# Patient Record
Sex: Male | Born: 1965 | Race: White | Hispanic: No | Marital: Married | Smoking: Former smoker
Health system: Southern US, Community
[De-identification: ages and names within clinical notes are randomized; demographics above are authoritative.]

## PROBLEM LIST (undated history)

## (undated) ENCOUNTER — Ambulatory Visit (HOSPITAL_BASED_OUTPATIENT_CLINIC_OR_DEPARTMENT_OTHER): Admission: EM | Source: Home / Self Care

## (undated) DIAGNOSIS — I10 Essential (primary) hypertension: Secondary | ICD-10-CM

## (undated) DIAGNOSIS — K219 Gastro-esophageal reflux disease without esophagitis: Secondary | ICD-10-CM

## (undated) DIAGNOSIS — E7800 Pure hypercholesterolemia, unspecified: Secondary | ICD-10-CM

## (undated) DIAGNOSIS — T7840XA Allergy, unspecified, initial encounter: Secondary | ICD-10-CM

## (undated) DIAGNOSIS — E119 Type 2 diabetes mellitus without complications: Secondary | ICD-10-CM

## (undated) DIAGNOSIS — M1990 Unspecified osteoarthritis, unspecified site: Secondary | ICD-10-CM

## (undated) HISTORY — DX: Unspecified osteoarthritis, unspecified site: M19.90

## (undated) HISTORY — PX: BACK SURGERY: SHX140

## (undated) HISTORY — DX: Allergy, unspecified, initial encounter: T78.40XA

## (undated) HISTORY — DX: Essential (primary) hypertension: I10

## (undated) HISTORY — DX: Gastro-esophageal reflux disease without esophagitis: K21.9

## (undated) HISTORY — DX: Type 2 diabetes mellitus without complications: E11.9

## (undated) HISTORY — DX: Pure hypercholesterolemia, unspecified: E78.00

---

## 2000-09-10 ENCOUNTER — Encounter: Payer: Self-pay | Admitting: Orthopedic Surgery

## 2000-09-10 ENCOUNTER — Ambulatory Visit (HOSPITAL_COMMUNITY): Admission: RE | Admit: 2000-09-10 | Discharge: 2000-09-10 | Payer: Self-pay | Admitting: Orthopedic Surgery

## 2000-11-03 ENCOUNTER — Encounter: Admission: RE | Admit: 2000-11-03 | Discharge: 2000-11-03 | Payer: Self-pay | Admitting: Orthopedic Surgery

## 2000-11-03 ENCOUNTER — Encounter: Payer: Self-pay | Admitting: Orthopedic Surgery

## 2000-11-17 ENCOUNTER — Encounter: Payer: Self-pay | Admitting: Orthopedic Surgery

## 2000-11-17 ENCOUNTER — Encounter: Admission: RE | Admit: 2000-11-17 | Discharge: 2000-11-17 | Payer: Self-pay | Admitting: Orthopedic Surgery

## 2000-12-01 ENCOUNTER — Encounter: Admission: RE | Admit: 2000-12-01 | Discharge: 2000-12-01 | Payer: Self-pay | Admitting: Orthopedic Surgery

## 2000-12-01 ENCOUNTER — Encounter: Payer: Self-pay | Admitting: Orthopedic Surgery

## 2001-04-27 HISTORY — PX: BACK SURGERY: SHX140

## 2005-09-11 ENCOUNTER — Ambulatory Visit (HOSPITAL_COMMUNITY): Admission: RE | Admit: 2005-09-11 | Discharge: 2005-09-12 | Payer: Self-pay | Admitting: Neurosurgery

## 2009-04-27 HISTORY — PX: BACK SURGERY: SHX140

## 2009-11-18 ENCOUNTER — Encounter: Admission: RE | Admit: 2009-11-18 | Discharge: 2009-11-18 | Payer: Self-pay | Admitting: Orthopedic Surgery

## 2010-06-07 ENCOUNTER — Emergency Department (HOSPITAL_COMMUNITY)
Admission: EM | Admit: 2010-06-07 | Discharge: 2010-06-07 | Disposition: A | Discharge status: Home / Self Care | Payer: BC Managed Care – PPO | Attending: Emergency Medicine | Admitting: Emergency Medicine

## 2010-06-07 DIAGNOSIS — M545 Low back pain, unspecified: Secondary | ICD-10-CM | POA: Insufficient documentation

## 2010-06-07 DIAGNOSIS — I10 Essential (primary) hypertension: Secondary | ICD-10-CM | POA: Insufficient documentation

## 2010-06-07 DIAGNOSIS — G8929 Other chronic pain: Secondary | ICD-10-CM | POA: Insufficient documentation

## 2010-06-07 DIAGNOSIS — E7800 Pure hypercholesterolemia, unspecified: Secondary | ICD-10-CM | POA: Insufficient documentation

## 2010-07-02 ENCOUNTER — Encounter (HOSPITAL_COMMUNITY)
Admission: RE | Admit: 2010-07-02 | Discharge: 2010-07-02 | Disposition: A | Discharge status: Home / Self Care | Payer: BC Managed Care – PPO | Source: Ambulatory Visit | Attending: Neurosurgery | Admitting: Neurosurgery

## 2010-07-02 LAB — PROTIME-INR
INR: 1 (ref 0.00–1.49)
Prothrombin Time: 13.4 seconds (ref 11.6–15.2)

## 2010-07-02 LAB — DIFFERENTIAL
Basophils Absolute: 0.1 10*3/uL (ref 0.0–0.1)
Basophils Relative: 1 % (ref 0–1)
Eosinophils Absolute: 0.2 10*3/uL (ref 0.0–0.7)
Monocytes Absolute: 0.4 10*3/uL (ref 0.1–1.0)
Monocytes Relative: 7 % (ref 3–12)
Neutro Abs: 3.6 10*3/uL (ref 1.7–7.7)

## 2010-07-02 LAB — BASIC METABOLIC PANEL
BUN: 10 mg/dL (ref 6–23)
CO2: 31 mEq/L (ref 19–32)
Calcium: 9.5 mg/dL (ref 8.4–10.5)
Creatinine, Ser: 0.89 mg/dL (ref 0.4–1.5)
GFR calc non Af Amer: >60 mL/min (ref >60)
Glucose, Bld: 148 mg/dL — ABNORMAL HIGH (ref 70–99)
Sodium: 137 mEq/L (ref 135–145)

## 2010-07-02 LAB — CBC
Hemoglobin: 14.3 g/dL (ref 13.0–17.0)
MCH: 28.6 pg (ref 26.0–34.0)
MCHC: 33.6 g/dL (ref 30.0–36.0)
Platelets: 229 10*3/uL (ref 150–400)
RDW: 12.5 % (ref 11.5–15.5)

## 2010-07-09 ENCOUNTER — Observation Stay (HOSPITAL_COMMUNITY)
Admission: RE | Admit: 2010-07-09 | Discharge: 2010-07-10 | Disposition: A | Discharge status: Home / Self Care | Payer: BC Managed Care – PPO | Source: Ambulatory Visit | Attending: Neurosurgery | Admitting: Neurosurgery

## 2010-07-09 ENCOUNTER — Ambulatory Visit (HOSPITAL_COMMUNITY): Payer: BC Managed Care – PPO

## 2010-07-09 DIAGNOSIS — Z01818 Encounter for other preprocedural examination: Secondary | ICD-10-CM | POA: Insufficient documentation

## 2010-07-09 DIAGNOSIS — Z01812 Encounter for preprocedural laboratory examination: Secondary | ICD-10-CM | POA: Insufficient documentation

## 2010-07-09 DIAGNOSIS — M5126 Other intervertebral disc displacement, lumbar region: Principal | ICD-10-CM | POA: Insufficient documentation

## 2010-07-09 LAB — GLUCOSE, CAPILLARY: Glucose-Capillary: 129 mg/dL — ABNORMAL HIGH (ref 70–99)

## 2010-07-13 NOTE — Op Note (Signed)
Ricardo Wolf, Ricardo Wolf               ACCOUNT NO.:  [id number]  MEDICAL RECORD NO.:  [id number]           PATIENT TYPE:  I  LOCATION:  3523                         FACILITY:  [facility]  PHYSICIAN:  Donalee Citrin, M.D.        DATE OF BIRTH:  04/09/1966  DATE OF PROCEDURE:  07/09/2010 DATE OF DISCHARGE:                              OPERATIVE REPORT   PREOPERATIVE DIAGNOSIS:  Left L4 radiculopathy from ruptured disk L3-4 left.  PROCEDURE:  Lumbar laminectomy and microdiskectomy L3-4 left for microscopic dissection of the left L4 nerve root microscopic diskectomy.  SURGEON:  Donalee Citrin, MD  ASSISTANT:  Reinaldo Meeker, MD  ANESTHESIA:  General endotracheal.  HISTORY OF PRESENT ILLNESS:  The patient is a very pleasant 45 year old gentleman who has had progressed worsening back and left leg pain running down the outside of his left thigh down to the shin consistent with an L4 nerve root pattern.  The patient failed all forms of conservative treatment and put on steroid injections, physical therapy, and time.  MRI scan showed a very large disk herniation with free fragment displacing the L4 nerve against the pedicle, and due to the patient failing conservative treatment, MRI findings, clinical exam, the patient was recommended laminectomy and diskectomy.  Went over the risks and benefits of the operation with the patient.  He understood and agreed to proceed forward.  The patient was brought to the OR, was induced under general anesthesia, positioned prone on Wilson frame.  Back was prepped and draped in usual sterile fashion.  Preoperative x-ray localized the appropriate level, so after infiltration of 10 mL lidocaine with epi a midline incision was made.  Bovie cautery was used to take down the subcutaneous tissue. Subperiosteal dissection was carried out on the lamina of L3 and L4 on the left.  Intraoperative x-ray identified the appropriate level, so the inferior aspect of L3 was  drilled down, medial facet complex, and superior aspect of L4.  Laminotomy was begun.  Ligament was noted to be markedly hypertrophied.  This was removed in piecemeal fashion.  The undersurface of the immediate gutter was underbitten to identify the L4 nerve root and the L4 pedicle.  The L4 nerve root was densely stuck against the pedicle due to free fragment that had migrated underneath the four root and was presenting in the axilla of the four root at the inferior margin of the pedicle, so after further underbiting of medial gutter to gain lateral access to disk space and lateral aspect of canal, the scar and annulus was then incised above the nerve root as well as inferior to the nerve root in the axilla to free up the densely adherent disk herniation underneath the four root.  This was accomplished with a nerve hook teasing the disk out.  Several large fragments were removed from the inferior compartment as well as superior compartment.  Disk space was then identified.  Epidural veins were coagulated.  Disk space was then cleaned out with pituitary rongeurs, and then marching inferiorly the L4 foramen was opened up and further fragments were removed both from superior and inferior  to the four root until the four root was completely decompressed easily accepting a coronary angle hockey stick out the foramen.  At the end of the diskectomy, there was no further stenosis.  The L4 nerve root was widely decompressed as well as thecal sac centrally as well as cephalocaudally.  The wound was then copiously irrigated.  Meticulous hemostasis was maintained.  Gelfoam was laid on top of the dura, and the muscle and fascia and proximal layers with interrupted Vicryl and the skin was closed with running 4-0 subcuticular.  Benzoin and Steri-Strips were applied.  The patient went to recovery room in stable condition.          ______________________________ Donalee Citrin, M.D.     GC/MEDQ  D:   07/09/2010  T:  07/10/2010  Job:  [id number]  Electronically Signed by Donalee Citrin M.D. on 07/13/2010 03:59:29 PM

## 2010-07-23 NOTE — Discharge Summary (Signed)
  NAMETYSHON, FANNING               ACCOUNT NO.:  [id number]  MEDICAL RECORD NO.:  [id number]           PATIENT TYPE:  O  LOCATION:  3523                         FACILITY:  [facility]  PHYSICIAN:  Donalee Citrin, M.D.        DATE OF BIRTH:  06/26/1965  DATE OF ADMISSION:  07/09/2010 DATE OF DISCHARGE:  07/10/2010                              DISCHARGE SUMMARY   ADMITTING DIAGNOSIS:  Lumbar radiculopathy from ruptured disk L3-4.  DISCHARGE DIAGNOSIS:  Lumbar radiculopathy from ruptured disk L3-4.  PROCEDURES:  Lumbar laminectomy and microdiskectomy L3-4.  HOSPITAL COURSE:  The patient was admitted to the MAU and went to the operating room and underwent the aforementioned procedure.  Postop, the patient did very well and recovering on the floor.  On the floor, he progressively mobilized with first postoperative night ambulating and voiding spontaneously, but he was able to be discharged home on p.o. pain medication.          ______________________________ Donalee Citrin, M.D.     GC/MEDQ  D:  07/13/2010  T:  07/14/2010  Job:  [id number]  Electronically Signed by Donalee Citrin M.D. on 07/23/2010 12:34:50 PM

## 2010-08-26 ENCOUNTER — Ambulatory Visit: Payer: BC Managed Care – PPO | Attending: Neurosurgery

## 2010-08-26 DIAGNOSIS — IMO0001 Reserved for inherently not codable concepts without codable children: Secondary | ICD-10-CM | POA: Insufficient documentation

## 2010-08-26 DIAGNOSIS — M545 Low back pain, unspecified: Secondary | ICD-10-CM | POA: Insufficient documentation

## 2010-08-26 DIAGNOSIS — R5381 Other malaise: Secondary | ICD-10-CM | POA: Insufficient documentation

## 2010-08-26 DIAGNOSIS — M2560 Stiffness of unspecified joint, not elsewhere classified: Secondary | ICD-10-CM | POA: Insufficient documentation

## 2010-09-04 ENCOUNTER — Ambulatory Visit: Payer: BC Managed Care – PPO

## 2010-09-08 ENCOUNTER — Ambulatory Visit: Payer: BC Managed Care – PPO

## 2010-09-11 ENCOUNTER — Ambulatory Visit: Payer: BC Managed Care – PPO

## 2010-09-12 NOTE — Op Note (Signed)
NAMEMAVRYK, PINO NO.:  [id number]   MEDICAL RECORD NO.:  [id number]          PATIENT TYPE:  OIB   LOCATION:  3017                         FACILITY:  [facility]   PHYSICIAN:  Donalee Citrin, M.D.        DATE OF BIRTH:  [birth year]   DATE OF PROCEDURE:  09/11/2005  DATE OF DISCHARGE:  09/12/2005                                 OPERATIVE REPORT   PREOPERATIVE DIAGNOSES:  1.  Left L5 radiculopathy.  2.  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   PROCEDURE:  Lumbar laminectomy and microdiskectomy L4-5 left with  microscopic dissection of left L5 nerve root and microscopic diskectomy.   SURGEON:  Donalee Citrin, M.D.   ASSISTANT:  Tia Alert, M.D.   ANESTHESIA:  General endotracheal.   HISTORY OF PRESENT ILLNESS:  The patient is a very pleasant 45 year old  gentleman who has had for the last few weeks severe back and predominantly  left leg pain radiating down to the posterior aspect of the left leg to the  top of his foot and his big toe.  He has some occasional pain down his right  leg, but it has not been as bad as his left.  He denies any bowel and  bladder difficulties.  He did have some numbness and weakness of his left  big toe.  MRI showed a very large ruptured disk with a free fragment causing  severe spinal stenosis with a 2-mm canal.  Due to the size and location of  the fragment, weakness in his EHL, the patient was recommended laminectomy  and microdiskectomy.  Risks and benefits were explained to the patient.  He  understands and gave informed consent.   OPERATIVE REPORT:  The patient was brought to the OR and given general  anesthesia.  The patient was placed prone on the Wilson frame.  Back was  prepped and draped in usual sterile fashion.  Preoperative x-ray showed  localization of the S1 spinous process, so a midline incision was made just  superior to this after infiltration of 10 cc of lidocaine with epinephrine.  Bovie cautery  was used to take down subperiosteal dissection carried down to  the lamina of L4 and L5 on the left side.  Intraoperative x-ray confirmed  localization of appropriate level.  Then using high-speed drill, the  inferior aspect of lamina of L4, medial facette complex and superior aspect  of the lamin of L5 was drilled down.  Then using 2 and 3 mm Kerrison  punches, the inferior aspect of the lamina of L4 medial facette complex was  removed as well as the superior aspect of the lamina of L5 exposing the  ligamentum flavum.  Then using a nerve hook and #4 Penfield, the plane  underneath the ligamentum flavum and thecal sac was developed.  The thecal  sac was markedly stenotic.  At this point, the operating microscope was  draped and brought into the field a little bit earlier than normal, and  under microscopic examination, the remainder of the lateral ligament was  dissected off of  the dura and very careful dissection in piecemeal fashion  of this ligament was removed, exposing the thecal sac and proximal L5 nerve  root.  The thecal sac and L5 nerve root were shown to be markedly compressed  and displaced dorsally and medially from a very large free fragment of disk  rupture, presenting underneath the L5 root in the lateral compartment.  Using a nerve hook, this was teased out and a very large fragment was  removed immediately.  This decompressed the thecal sac centrally and the L5  nerve root resumed its normal anatomical location.  At this point, the  remainder of the lateral ligament was under bitten to expose the lateral  disk space.  Epidural veins were coagulated.  ___________ was used to  reflect the L5 nerve root medically and a laminotomy was done with a #11  blade scalpel.  Pituitary rongeurs were used to radically clean out the disk  space.  Several more fragments were removed from the disk space, both  centrally, laterally, inferiorly and caudally.  At the end of the  diskectomy,  there was no further stenosis on thecal sac or L5 nerve root.  It was explored with a coronary dilator, angled hockey stick, as well as  reaching all the way across.  Additional medial spinous process was drilled  down to obtain further retraction as well as probing across the midline to  the contralateral L5 nerve root, and a probe was easily passed all the way  over to the other side confirming no fragments retained on the other side.  So the wound was copiously irrigated.  Meticulous hemostasis was maintained.  Aggressive meticulous hemostasis.  A __________ was laid on top of the dura.  The muscle and fascia were re-approximated with intraoperative Vicryl.  The  skin was closed with running 4-0 subcuticular.  Benzoin and Steri-Strips  were applied.  The patient went to recovery room in stable condition.           ______________________________  Donalee Citrin, M.D.     GC/MEDQ  D:  09/11/2005  T:  09/12/2005  Job:  [id number]

## 2010-09-16 ENCOUNTER — Ambulatory Visit: Payer: BC Managed Care – PPO

## 2010-09-18 ENCOUNTER — Ambulatory Visit: Payer: BC Managed Care – PPO

## 2017-11-10 ENCOUNTER — Ambulatory Visit: Payer: 59 | Admitting: Family Medicine

## 2017-11-10 ENCOUNTER — Encounter: Payer: Self-pay | Admitting: Family Medicine

## 2017-11-10 VITALS — BP 154/111 | HR 83 | Ht 73.0 in | Wt 350.0 lb

## 2017-11-10 DIAGNOSIS — E1169 Type 2 diabetes mellitus with other specified complication: Secondary | ICD-10-CM

## 2017-11-10 DIAGNOSIS — I152 Hypertension secondary to endocrine disorders: Secondary | ICD-10-CM

## 2017-11-10 DIAGNOSIS — Z9119 Patient's noncompliance with other medical treatment and regimen: Secondary | ICD-10-CM | POA: Diagnosis not present

## 2017-11-10 DIAGNOSIS — E1159 Type 2 diabetes mellitus with other circulatory complications: Secondary | ICD-10-CM

## 2017-11-10 DIAGNOSIS — E782 Mixed hyperlipidemia: Secondary | ICD-10-CM

## 2017-11-10 DIAGNOSIS — I10 Essential (primary) hypertension: Secondary | ICD-10-CM

## 2017-11-10 DIAGNOSIS — E119 Type 2 diabetes mellitus without complications: Secondary | ICD-10-CM | POA: Insufficient documentation

## 2017-11-10 DIAGNOSIS — Z91199 Patient's noncompliance with other medical treatment and regimen due to unspecified reason: Secondary | ICD-10-CM | POA: Insufficient documentation

## 2017-11-10 NOTE — Patient Instructions (Signed)
Please realize, EXERCISE IS MEDICINE!  -  American Heart Association [facility]) guidelines for exercise : If you are in good health, without any medical conditions, you should engage in 150 minutes of moderate intensity aerobic activity per week.  This means you should be huffing and puffing throughout your workout.   Engaging in regular exercise will improve brain function and memory, as well as improve mood, boost immune system and help with weight management.  As well as the other, more well-known effects of exercise such as decreasing blood sugar levels, decreasing blood pressure,  and decreasing bad cholesterol levels/ increasing good cholesterol levels.     -  The AHA strongly endorses consumption of a diet that contains a variety of foods from all the food categories with an emphasis on fruits and vegetables; fat-free and low-fat dairy products; cereal and grain products; legumes and nuts; and fish, poultry, and/or extra lean meats.    Excessive food intake, especially of foods high in saturated and trans fats, sugar, and salt, should be avoided.    Adequate water intake of roughly 1/2 of your weight in pounds, should equal the ounces of water per day you should drink.  So for instance, if you're 200 pounds, that would be 100 ounces of water per day.         Mediterranean Diet  Why follow it? Research shows. . Those who follow the Mediterranean diet have a reduced risk of heart disease  . The diet is associated with a reduced incidence of Parkinson's and Alzheimer's diseases . People following the diet may have longer life expectancies and lower rates of chronic diseases  . The Dietary Guidelines for Americans recommends the Mediterranean diet as an eating plan to promote health and prevent disease  What Is the Mediterranean Diet?  . Healthy eating plan based on typical foods and recipes of Mediterranean-style cooking . The diet is primarily a plant based diet; these foods should make up a  majority of meals   Starches - Plant based foods should make up a majority of meals - They are an important sources of vitamins, minerals, energy, antioxidants, and fiber - Choose whole grains, foods high in fiber and minimally processed items  - Typical grain sources include wheat, oats, barley, corn, brown rice, bulgar, farro, millet, polenta, couscous  - Various types of beans include chickpeas, lentils, fava beans, black beans, white beans   Fruits  Veggies - Large quantities of antioxidant rich fruits & veggies; 6 or more servings  - Vegetables can be eaten raw or lightly drizzled with oil and cooked  - Vegetables common to the traditional Mediterranean Diet include: artichokes, arugula, beets, broccoli, brussel sprouts, cabbage, carrots, celery, collard greens, cucumbers, eggplant, kale, leeks, lemons, lettuce, mushrooms, okra, onions, peas, peppers, potatoes, pumpkin, radishes, rutabaga, shallots, spinach, sweet potatoes, turnips, zucchini - Fruits common to the Mediterranean Diet include: apples, apricots, avocados, cherries, clementines, dates, figs, grapefruits, grapes, melons, nectarines, oranges, peaches, pears, pomegranates, strawberries, tangerines  Fats - Replace butter and margarine with healthy oils, such as olive oil, canola oil, and tahini  - Limit nuts to no more than a handful a day  - Nuts include walnuts, almonds, pecans, pistachios, pine nuts  - Limit or avoid candied, honey roasted or heavily salted nuts - Olives are central to the Mediterranean diet - can be eaten whole or used in a variety of dishes   Meats Protein - Limiting red meat: no more than a few times a month -  When eating red meat: choose lean cuts and keep the portion to the size of deck of cards - Eggs: approx. 0 to 4 times a week  - Fish and lean poultry: at least 2 a week  - Healthy protein sources include, chicken, [place], lean beef, lamb - Increase intake of seafood such as tuna, salmon, trout,  mackerel, shrimp, scallops - Avoid or limit high fat processed meats such as sausage and bacon  Dairy - Include moderate amounts of low fat dairy products  - Focus on healthy dairy such as fat free yogurt, skim milk, low or reduced fat cheese - Limit dairy products higher in fat such as whole or 2% milk, cheese, ice cream  Alcohol - Moderate amounts of red wine is ok  - No more than 5 oz daily for women (all ages) and men older than age 72  - No more than 10 oz of wine daily for men younger than 12  Other - Limit sweets and other desserts  - Use herbs and spices instead of salt to flavor foods  - Herbs and spices common to the traditional Mediterranean Diet include: basil, bay leaves, chives, cloves, cumin, fennel, garlic, lavender, marjoram, mint, oregano, parsley, pepper, rosemary, sage, savory, sumac, tarragon, thyme   It's not just a diet, it's a lifestyle:  . The Mediterranean diet includes lifestyle factors typical of those in the region  . Foods, drinks and meals are best eaten with others and savored . Daily physical activity is important for overall good health . This could be strenuous exercise like running and aerobics . This could also be more leisurely activities such as walking, housework, yard-work, or taking the stairs . Moderation is the key; a balanced and healthy diet accommodates most foods and drinks . Consider portion sizes and frequency of consumption of certain foods   Meal Ideas & Options:  . Breakfast:  o Whole wheat toast or whole wheat English muffins with peanut butter & hard boiled egg o Steel cut oats topped with apples & cinnamon and skim milk  o Fresh fruit: banana, strawberries, melon, berries, peaches  o Smoothies: strawberries, bananas, greek yogurt, peanut butter o Low fat greek yogurt with blueberries and granola  o Egg white omelet with spinach and mushrooms o Breakfast couscous: whole wheat couscous, apricots, skim milk, cranberries  . Sandwiches:   o Hummus and grilled vegetables (peppers, zucchini, squash) on whole wheat bread   o Grilled chicken on whole wheat pita with lettuce, tomatoes, cucumbers or tzatziki  o Tuna salad on whole wheat bread: tuna salad made with greek yogurt, olives, red peppers, capers, green onions o Garlic rosemary lamb pita: lamb sauted with garlic, rosemary, salt & pepper; add lettuce, cucumber, greek yogurt to pita - flavor with lemon juice and black pepper  . Seafood:  o Mediterranean grilled salmon, seasoned with garlic, basil, parsley, lemon juice and black pepper o Shrimp, lemon, and spinach whole-grain pasta salad made with low fat greek yogurt  o Seared scallops with lemon orzo  o Seared tuna steaks seasoned salt, pepper, coriander topped with tomato mixture of olives, tomatoes, olive oil, minced garlic, parsley, green onions and cappers  . Meats:  o Herbed greek chicken salad with kalamata olives, cucumber, feta  o Red bell peppers stuffed with spinach, bulgur, lean ground beef (or lentils) & topped with feta   o Kebabs: skewers of chicken, tomatoes, onions, zucchini, squash  o [place] burgers: made with red onions, mint, dill, lemon juice, feta  cheese topped with roasted red peppers . Vegetarian o Cucumber salad: cucumbers, artichoke hearts, celery, red onion, feta cheese, tossed in olive oil & lemon juice  o Hummus and whole grain pita points with a greek salad (lettuce, tomato, feta, olives, cucumbers, red onion) o Lentil soup with celery, carrots made with vegetable broth, garlic, salt and pepper  o Tabouli salad: parsley, bulgur, mint, scallions, cucumbers, tomato, radishes, lemon juice, olive oil, salt and pepper.    Diabetes Mellitus and Standards of Medical Care  Managing diabetes (diabetes mellitus) can be complicated. Your diabetes treatment may be managed by a team of health care providers, including:  A diet and nutrition specialist (registered dietitian).  A nurse.  A certified  diabetes educator (CDE).  A diabetes specialist (endocrinologist).  An eye doctor.  A primary care provider.  A dentist.  Your health care providers follow a schedule in order to help you get the best quality of care. The following schedule is a general guideline for your diabetes management plan. Your health care providers may also give you more specific instructions.  HbA1c (hemoglobin A1c) test This test provides information about blood sugar (glucose) control over the previous 2-3 months. It is used to check whether your diabetes management plan needs to be adjusted.  If you are meeting your treatment goals, this test is done at least 2 times a year.  If you are not meeting treatment goals or if your treatment goals have changed, this test is done 4 times a year.  Blood pressure test  This test is done at every routine medical visit. For most people, the goal is less than 130/80. Ask your health care provider what your goal blood pressure should be.  Dental and eye exams  Visit your dentist two times a year.  If you have type 1 diabetes, get an eye exam 3-5 years after you are diagnosed, and then once a year after your first exam. ? If you were diagnosed with type 1 diabetes as a child, get an eye exam when you are age 17 or older and have had diabetes for 3-5 years. After the first exam, you should get an eye exam once a year.  If you have type 2 diabetes, have an eye exam as soon as you are diagnosed, and then once a year after your first exam.  Foot care exam  Visual foot exams are done at every routine medical visit. The exams check for cuts, bruises, redness, blisters, sores, or other problems with the feet.  A complete foot exam is done by your health care provider once a year. This exam includes an inspection of the structure and skin of your feet, and a check of the pulses and sensation in your feet. ? Type 1 diabetes: Get your first exam 3-5 years after  diagnosis. ? Type 2 diabetes: Get your first exam as soon as you are diagnosed.  Check your feet every day for cuts, bruises, redness, blisters, or sores. If you have any of these or other problems that are not healing, contact your health care provider.  Kidney function test (urine microalbumin)  This test is done once a year. ? Type 1 diabetes: Get your first test 5 years after diagnosis. ? Type 2 diabetes: Get your first test as soon as you are diagnosed._  If you have chronic kidney disease (CKD), get a serum creatinine and estimated glomerular filtration rate (eGFR) test once a year.  Lipid profile (cholesterol, HDL, LDL, triglycerides)  This test should be done when you are diagnosed with diabetes, and every 5 years after the first test. If you are on medicines to lower your cholesterol, you may need to get this test done every year. ? The goal for LDL is less than 100 mg/dL (5.5 mmol/L). If you are at high risk, the goal is less than 70 mg/dL (3.9 mmol/L). ? The goal for HDL is 40 mg/dL (2.2 mmol/L) for men and 50 mg/dL(2.8 mmol/L) for women. An HDL cholesterol of 60 mg/dL (3.3 mmol/L) or higher gives some protection against heart disease. ? The goal for triglycerides is less than 150 mg/dL (8.3 mmol/L).  Immunizations  The yearly flu (influenza) vaccine is recommended for everyone 6 months or older who has diabetes.  The pneumonia (pneumococcal) vaccine is recommended for everyone 2 years or older who has diabetes. If you are 55 or older, you may get the pneumonia vaccine as a series of two separate shots.  The hepatitis B vaccine is recommended for adults shortly after they have been diagnosed with diabetes.  The Tdap (tetanus, diphtheria, and pertussis) vaccine should be given: ? According to normal childhood vaccination schedules, for children. ? Every 10 years, for adults who have diabetes.  The shingles vaccine is recommended for people who have had chicken pox and are 50  years or older.  Mental and emotional health  Screening for symptoms of eating disorders, anxiety, and depression is recommended at the time of diagnosis and afterward as needed. If your screening shows that you have symptoms (you have a positive screening result), you may need further evaluation and be referred to a mental health care provider.  Diabetes self-management education  Education about how to manage your diabetes is recommended at diagnosis and ongoing as needed.  Treatment plan  Your treatment plan will be reviewed at every medical visit.  Summary  Managing diabetes (diabetes mellitus) can be complicated. Your diabetes treatment may be managed by a team of health care providers.  Your health care providers follow a schedule in order to help you get the best quality of care.  Standards of care including having regular physical exams, blood tests, blood pressure monitoring, immunizations, screening tests, and education about how to manage your diabetes.  Your health care providers may also give you more specific instructions based on your individual health.      Type 2 Diabetes Mellitus, Self Care, Adult Caring for yourself after you have been diagnosed with type 2 diabetes (type 2 diabetes mellitus) means keeping your blood sugar (glucose) under control with a balance of:  Nutrition.  Exercise.  Lifestyle changes.  Medicines or insulin, if necessary.  Support from your team of health care providers and others.  The following information explains what you need to know to manage your diabetes at home. What do I need to do to manage my blood glucose?  Check your blood glucose every day, as often as told by your health care provider.  Contact your health care provider if your blood glucose is above your target for 2 tests in a row.  Have your A1c (hemoglobin A1c) level checked at least two times a year, or as often as told by your health care provider. Your  health care provider will set individualized treatment goals for you. Generally, the goal of treatment is to maintain the following blood glucose levels:  Before meals (preprandial): 80-130 mg/dL (4.4-7.2 mmol/L).  After meals (postprandial): below 180 mg/dL (10 mmol/L).  A1c level: less  than 7%.  What do I need to know about hyperglycemia and hypoglycemia? What is hyperglycemia? Hyperglycemia, also called high blood glucose, occurs when blood glucose is too high.Make sure you know the early signs of hyperglycemia, such as:  Increased thirst.  Hunger.  Feeling very tired.  Needing to urinate more often than usual.  Blurry vision.  What is hypoglycemia? Hypoglycemia, also called low blood glucose, occurswith a blood glucose level at or below 70 mg/dL (3.9 mmol/L). The risk for hypoglycemia increases during or after exercise, during sleep, during illness, and when skipping meals or not eating for a long time (fasting). It is important to know the symptoms of hypoglycemia and treat it right away. Always have a 15-gram rapid-acting carbohydrate snack with you to treat low blood glucose. Family members and close friends should also know the symptoms and should understand how to treat hypoglycemia, in case you are not able to treat yourself. What are the symptoms of hypoglycemia? Hypoglycemia symptoms can include:  Hunger.  Anxiety.  Sweating and feeling clammy.  Confusion.  Dizziness or feeling light-headed.  Sleepiness.  Nausea.  Increased heart rate.  Headache.  Blurry vision.  Seizure.  Nightmares.  Tingling or numbness around the mouth, lips, or tongue.  A change in speech.  Decreased ability to concentrate.  A change in coordination.  Restless sleep.  Tremors or shakes.  Fainting.  Irritability.  How do I treat hypoglycemia?  If you are alert and able to swallow safely, follow the 15:15 rule:  Take 15 grams of a rapid-acting carbohydrate.  Rapid-acting options include: ? 1 tube of glucose gel. ? 3 glucose pills. ? 6-8 pieces of hard candy. ? 4 oz (120 mL) of fruit juice. ? 4 oz (120 mL) of regular (not diet) soda.  Check your blood glucose 15 minutes after you take the carbohydrate.  If the repeat blood glucose level is still at or below 70 mg/dL (3.9 mmol/L), take 15 grams of a carbohydrate again.  If your blood glucose level does not increase above 70 mg/dL (3.9 mmol/L) after 3 tries, seek emergency medical care.  After your blood glucose level returns to normal, eat a meal or a snack within 1 hour.  How do I treat severe hypoglycemia? Severe hypoglycemia is when your blood glucose level is at or below 54 mg/dL (3 mmol/L). Severe hypoglycemia is an emergency. Do not wait to see if the symptoms will go away. Get medical help right away. Call your local emergency services (911 in the [place] Do not drive yourself to the hospital. If you have severe hypoglycemia and you cannot eat or drink, you may need an injection of glucagon. A family member or close friend should learn how to check your blood glucose and how to give you a glucagon injection. Ask your health care provider if you need to have an emergency glucagon injection kit available. Severe hypoglycemia may need to be treated in a hospital. The treatment may include getting glucose through an IV tube. You may also need treatment for the cause of your hypoglycemia. Can having diabetes put me at risk for other conditions? Having diabetes can put you at risk for other long-term (chronic) conditions, such as heart disease and kidney disease. Your health care provider may prescribe medicines to help prevent complications from diabetes. These medicines may include:  Aspirin.  Medicine to lower cholesterol.  Medicine to control blood pressure.  What else can I do to manage my diabetes? Take your diabetes medicines as  told  If your health care provider prescribed insulin or  diabetes medicines, take them every day.  Do not run out of insulin or other diabetes medicines that you take. Plan ahead so you always have these available.  If you use insulin, adjust your dosage based on how physically active you are and what foods you eat. Your health care provider will tell you how to adjust your dosage. Make healthy food choices  The things that you eat and drink affect your blood glucose and your insulin dosage. Making good choices helps to control your diabetes and prevent other health problems. A healthy meal plan includes eating lean proteins, complex carbohydrates, fresh fruits and vegetables, low-fat dairy products, and healthy fats. Make an appointment to see a diet and nutrition specialist (registered dietitian) to help you create an eating plan that is right for you. Make sure that you:  Follow instructions from your health care provider about eating or drinking restrictions.  Drink enough fluid to keep your urine clear or pale yellow.  Eat healthy snacks between nutritious meals.  Track the carbohydrates that you eat. Do this by reading food labels and learning the standard serving sizes of foods.  Follow your sick day plan whenever you cannot eat or drink as usual. Make this plan in advance with your health care provider.  Stay active  Exercise regularly, as told by your health care provider. This may include:  Stretching and doing strength exercises, such as yoga or weightlifting, at least 2 times a week.  Doing at least 150 minutes of moderate-intensity or vigorous-intensity exercise each week. This could be brisk walking, biking, or water aerobics. ? Spread out your activity over at least 3 days of the week. ? Do not go more than 2 days in a row without doing some kind of physical activity.  When you start a new exercise or activity, work with your health care provider to adjust your insulin, medicines, or food intake as needed. Make healthy  lifestyle choices  Do not use any tobacco products, such as cigarettes, chewing tobacco, and e-cigarettes. If you need help quitting, ask your health care provider.  If your health care provider says that alcohol is safe for you, limit alcohol intake to no more than 1 drink per day for nonpregnant women and 2 drinks per day for men. One drink equals 12 oz of beer, 5 oz of wine, or 1 oz of hard liquor.  Learn to manage stress. If you need help with this, ask your health care provider. Care for your body   Keep your immunizations up to date. In addition to getting vaccinations as told by your health care provider, it is recommended that you get vaccinated against the following illnesses: ? The flu (influenza). Get a flu shot every year. ? Pneumonia. ? Hepatitis B.  Schedule an eye exam soon after your diagnosis, and then one time every year after that.  Check your skin and feet every day for cuts, bruises, redness, blisters, or sores. Schedule a foot exam with your health care provider once every year.  Brush your teeth and gums two times a day, and floss at least one time a day. Visit your dentist at least once every 6 months.  Maintain a healthy weight. General instructions  Take over-the-counter and prescription medicines only as told by your health care provider.  Share your diabetes management plan with people in your workplace, school, and household.  Check your urine for ketones when you  are ill and as told by your health care provider.  Ask your health care provider: ? Do I need to meet with a diabetes educator? ? Where can I find a support group for people with diabetes?  Carry a medical alert card or wear medical alert jewelry.  Keep all follow-up visits as told by your health care provider. This is important. Where to find more information: For more information about diabetes, visit:  American Diabetes Association (ADA): www.diabetes.org  American Association of  Diabetes Educators (AADE): www.diabeteseducator.org/patient-resources  This information is not intended to replace advice given to you by your health care provider. Make sure you discuss any questions you have with your health care provider. Document Released: 08/05/2015 Document Revised: 09/19/2015 Document Reviewed: 05/17/2015 Elsevier Interactive Patient Education  2017 [place].      Blood Glucose Monitoring, Adult Monitoring your blood sugar (glucose) helps you manage your diabetes. It also helps you and your health care provider determine how well your diabetes management plan is working. Blood glucose monitoring involves checking your blood glucose as often as directed, and keeping a record (log) of your results over time. Why should I monitor my blood glucose? Checking your blood glucose regularly can:  Help you understand how food, exercise, illnesses, and medicines affect your blood glucose.  Let you know what your blood glucose is at any time. You can quickly tell if you are having low blood glucose (hypoglycemia) or high blood glucose (hyperglycemia).  Help you and your health care provider adjust your medicines as needed.  When should I check my blood glucose? Follow instructions from your health care provider about how often to check your blood glucose.   This may depend on:  The type of diabetes you have.  How well-controlled your diabetes is.  Medicines you are taking.  If you have type 1 diabetes:  Check your blood glucose at least 2 times a day.  Also check your blood glucose: ? Before every insulin injection. ? Before and after exercise. ? Between meals. ? 2 hours after a meal. ? Occasionally between 2:00 a.m. and 3:00 a.m., as directed. ? Before potentially dangerous tasks, like driving or using heavy machinery. ? At bedtime.  You may need to check your blood glucose more often, up to 6-10 times a day: ? If you use an insulin pump. ? If you need  multiple daily injections (MDI). ? If your diabetes is not well-controlled. ? If you are ill. ? If you have a history of severe hypoglycemia. ? If you have a history of not knowing when your blood glucose is getting low (hypoglycemia unawareness).  If you have type 2 diabetes:  If you take insulin or other diabetes medicines, check your blood glucose at least 2 times a day.  If you are on intensive insulin therapy, check your blood glucose at least 4 times a day. Occasionally, you may also need to check between 2:00 a.m. and 3:00 a.m., as directed.  Also check your blood glucose: ? Before and after exercise. ? Before potentially dangerous tasks, like driving or using heavy machinery.  You may need to check your blood glucose more often if: ? Your medicine is being adjusted. ? Your diabetes is not well-controlled. ? You are ill.  What is a blood glucose log?  A blood glucose log is a record of your blood glucose readings. It helps you and your health care provider: ? Look for patterns in your blood glucose over time. ? Adjust  your diabetes management plan as needed.  Every time you check your blood glucose, write down your result and notes about things that may be affecting your blood glucose, such as your diet and exercise for the day.  Most glucose meters store a record of glucose readings in the meter. Some meters allow you to download your records to a computer. How do I check my blood glucose? Follow these steps to get accurate readings of your blood glucose: Supplies needed   Blood glucose meter.  Test strips for your meter. Each meter has its own strips. You must use the strips that come with your meter.  A needle to prick your finger (lancet). Do not use lancets more than once.  A device that holds the lancet (lancing device).  A journal or log book to write down your results.  Procedure  Wash your hands with soap and water.  Prick the side of your finger (not  the tip) with the lancet. Use a different finger each time.  Gently rub the finger until a small drop of blood appears.  Follow instructions that come with your meter for inserting the test strip, applying blood to the strip, and using your blood glucose meter.  Write down your result and any notes.  Alternative testing sites  Some meters allow you to use areas of your body other than your finger (alternative sites) to test your blood.  If you think you may have hypoglycemia, or if you have hypoglycemia unawareness, do not use alternative sites. Use your finger instead.  Alternative sites may not be as accurate as the fingers, because blood flow is slower in these areas. This means that the result you get may be delayed, and it may be different from the result that you would get from your finger.  The most common alternative sites are: ? Forearm. ? Thigh. ? Palm of the hand.  Additional tips  Always keep your supplies with you.  If you have questions or need help, all blood glucose meters have a 24-hour "hotline" number that you can call. You may also contact your health care provider.  After you use a few boxes of test strips, adjust (calibrate) your blood glucose meter by following instructions that came with your meter.    The American Diabetes Association suggests the following targets for most nonpregnant adults with diabetes.  More or less stringent glycemic goals may be appropriate for each individual.  A1C: Less than 7% A1C may also be reported as eAG: Less than 154 mg/dl Before a meal (preprandial plasma glucose): 80-130 mg/dl 1-2 hours after beginning of the meal (Postprandial plasma glucose)*: Less than 180 mg/dl  *Postprandial glucose may be targeted if A1C goals are not met despite reaching preprandial glucose goals.   GOALS in short:  The goals are for the Hgb A1C to be less than 7.0 & blood pressure to be less than 130/80.    It is recommended that all  diabetics are educated on and follow a healthy diabetic diet, exercise for 30 minutes 3-4 times per week (walking, biking, swimming, or machine), monitor blood glucose readings and bring that record with you to be reviewed at your next office visit.     You should be checking fasting blood sugars- especially after you eat poorly or eat really healthy, and also check 2 hour postprandial blood sugars after largest meal of the day.    Write these down and bring in your log at each office visit.  You will need to be seen every 3 months by the provider managing your Diabetes unless told otherwise by that provider.   You will need yearly eye exams from an eye specialist and foot exams to check the nerves of your feet.  Also, your urine should be checked yearly as well to make sure excess protein is not present.   If you are checking your blood pressure at home, please record it and bring it to your next office visit.    Follow the Dietary Approaches to Stop Hypertension (DASH) diet (3 servings of fruit and vegetables daily, whole grains, low sodium, low-fat proteins).  See below.    Lastly, when it comes to your cholesterol, the goal is to have the HDL (good cholesterol) >40, and the LDL (bad cholesterol) <100.   It is recommended that you follow a heart healthy, low saturated and trans-fat diet and exercise for 30 minutes at least 5 times a week.     (( Check out the DASH diet = 1.5 Gram Low Sodium Diet   A 1.5 gram sodium diet restricts the amount of sodium in the diet to no more than 1.5 g or 1500 mg daily.  The American Heart Association recommends Americans over the age of 64 to consume no more than 1500 mg of sodium each day to reduce the risk of developing high blood pressure.  Research also shows that limiting sodium may reduce heart attack and stroke risk.  Many foods contain sodium for flavor and sometimes as a preservative.  When the amount of sodium in a diet needs to be low, it is important  to know what to look for when choosing foods and drinks.  The following includes some information and guidelines to help make it easier for you to adapt to a low sodium diet.    QUICK TIPS  Do not add salt to food.  Avoid convenience items and fast food.  Choose unsalted snack foods.  Buy lower sodium products, often labeled as "lower sodium" or "no salt added."  Check food labels to learn how much sodium is in 1 serving.  When eating at a restaurant, ask that your food be prepared with less salt or none, if possible.    READING FOOD LABELS FOR SODIUM INFORMATION  The nutrition facts label is a good place to find how much sodium is in foods. Look for products with no more than 400 mg of sodium per serving.  Remember that 1.5 g = 1500 mg.  The food label may also list foods as:  Sodium-free: Less than 5 mg in a serving.  Very low sodium: 35 mg or less in a serving.  Low-sodium: 140 mg or less in a serving.  Light in sodium: 50% less sodium in a serving. For example, if a food that usually has 300 mg of sodium is changed to become light in sodium, it will have 150 mg of sodium.  Reduced sodium: 25% less sodium in a serving. For example, if a food that usually has 400 mg of sodium is changed to reduced sodium, it will have 300 mg of sodium.    CHOOSING FOODS  Grains  Avoid: Salted crackers and snack items. Some cereals, including instant hot cereals. Bread stuffing and biscuit mixes. Seasoned rice or pasta mixes.  Choose: Unsalted snack items. Low-sodium cereals, oats, puffed wheat and rice, shredded wheat. English muffins and bread. Pasta.  Meats  Avoid: Salted, canned, smoked, spiced, pickled meats, including fish and poultry.  Bacon, ham, sausage, cold cuts, hot dogs, anchovies.  Choose: Low-sodium canned tuna and salmon. Fresh or frozen meat, poultry, and fish.  Dairy  Avoid: Processed cheese and spreads. Cottage cheese. Buttermilk and condensed milk. Regular cheese.  Choose: Milk.  Low-sodium cottage cheese. Yogurt. Sour cream. Low-sodium cheese.  Fruits and Vegetables  Avoid: Regular canned vegetables. Regular canned tomato sauce and paste. Frozen vegetables in sauces. Olives. Angie Fava. Relishes. Sauerkraut.  Choose: Low-sodium canned vegetables. Low-sodium tomato sauce and paste. Frozen or fresh vegetables. Fresh and frozen fruit.  Condiments  Avoid: Canned and packaged gravies. Worcestershire sauce. Tartar sauce. Barbecue sauce. Soy sauce. Steak sauce. Ketchup. Onion, garlic, and table salt. Meat flavorings and tenderizers.  Choose: Fresh and dried herbs and spices. Low-sodium varieties of mustard and ketchup. Lemon juice. Tabasco sauce. Horseradish.    SAMPLE 1.5 GRAM SODIUM MEAL PLAN:   Breakfast / Sodium (mg)  1 cup low-fat milk / 143 mg  1 whole-wheat English muffin / 240 mg  1 tbs heart-healthy margarine / 153 mg  1 hard-boiled egg / 139 mg  1 small orange / 0 mg  Lunch / Sodium (mg)  1 cup raw carrots / 76 mg  2 tbs no salt added peanut butter / 5 mg  2 slices whole-wheat bread / 270 mg  1 tbs jelly / 6 mg   cup red grapes / 2 mg  Dinner / Sodium (mg)  1 cup whole-wheat pasta / 2 mg  1 cup low-sodium tomato sauce / 73 mg  3 oz lean ground beef / 57 mg  1 small side salad (1 cup raw spinach leaves,  cup cucumber,  cup yellow bell pepper) with 1 tsp olive oil and 1 tsp red wine vinegar / 25 mg  Snack / Sodium (mg)  1 container low-fat vanilla yogurt / 107 mg  3 graham cracker squares / 127 mg  Nutrient Analysis  Calories: 1745  Protein: 75 g  Carbohydrate: 237 g  Fat: 57 g  Sodium: 1425 mg  Document Released: 04/13/2005 Document Revised: 12/24/2010 Document Reviewed: 07/15/2009  ExitCare Patient Information 2012 [place].))    This information is not intended to replace advice given to you by your health care provider. Make sure you discuss any questions you have with your health care provider. Document Released: 04/16/2003 Document  Revised: 11/01/2015 Document Reviewed: 09/23/2015 Elsevier Interactive Patient Education  2017 [employer].

## 2017-11-10 NOTE — Progress Notes (Signed)
New patient office visit note:   Impression and Recommendations:    1. Type 2 diabetes mellitus with other specified complication, without long-term current use of insulin ([place])   2. Hypertension associated with diabetes ([place])   3. Mixed diabetic hyperlipidemia associated with type 2 diabetes mellitus ([place])   4. Morbid obesity ([place])-  BMI 46.2   5. Noncompliance     - Advised patient on need for up to date screenings and immunizations.  - Per patient, often non-compliant with medications.  Encouraged patient to take his medications CONSISTENTLY every day for the next six weeks.  If patient desires to take his medication at night, if this will help him remember to take his medications, he may do this.  1. Diabetes Mellitus  - 06/24/2016 = HbA1c was 5.7. - Patient & treatment new to clinic. - Please take medications consistently as previously prescribed.  - Will evaluate patient's medications on follow-up.  - Check FBS and 2 hours after the biggest meal of your day.  Keep log and bring in next OV for my review.   Also, if you ever feel poorly, please check your blood pressure and blood sugar, as one or the other could be the cause of your symptoms.  2. Hypertension - Patient & treatment new to clinic. - Blood pressure elevated today. - Please take medications consistently as previously prescribed.  - Ambulatory BP monitoring encouraged.  Keep log and bring in next OV.  Sit quietly for 15-20 minutes before obtaining measurement, with no prior stimulation (caffiene, emotional distress).  3. Cholesterol - Patient & treatment new to clinic. - Please take medications consistently as previously prescribed.  4. BMI Counseling Explained to patient what BMI refers to, and what it means medically.    Told patient to think about it as a "medical risk stratification measurement" and how increasing BMI is associated with increasing risk/ or worsening state of various diseases such as  hypertension, hyperlipidemia, diabetes, premature OA, depression etc.  American Heart Association guidelines for healthy diet, basically Mediterranean diet, and exercise guidelines of 30 minutes 5 days per week or more discussed in detail.  Health counseling performed.  All questions answered.  5. Lifestyle & Preventative Health Maintenance - Advised patient to continue working toward exercising to improve overall mental, physical, and emotional health.    - Encouraged patient to engage in daily devoted exercise.  Recommended that the patient eventually strive for at least 150 minutes of moderate cardiovascular activity per week according to guidelines established by the [facility].   - Healthy dietary habits encouraged, including low-carb, and high amounts of lean protein in diet.   - Patient should also consume adequate amounts of water - half of body weight in oz of water per day.  6. Follow-Up - Return at patient's earliest convenience for fasting lab work, with OV to follow to discuss labs.   Education and routine counseling performed. Handouts provided.  Orders Placed This Encounter  Procedures  . Microalbumin / creatinine urine ratio  . VITAMIN D 25 Hydroxy (Vit-D Deficiency, Fractures)  . TSH  . T4, [place]  . Magnesium  . Comprehensive metabolic panel  . CBC with Differential/Platelet  . Lipid panel  . HIV antibody    No orders of the defined types were placed in this encounter.   Gross side effects, risk and benefits, and alternatives of medications discussed with patient.  Patient is aware that all medications have potential side effects and we are  unable to predict every side effect or drug-drug interaction that may occur.  Expresses verbal understanding and consents to current therapy plan and treatment regimen.  Return for Chronic follow-up with me next available fasting blood work to 3-day prior..  Please see AVS handed out to patient at the end of our visit for further  patient instructions/ counseling done pertaining to today's office visit.    Note: This document was prepared using Dragon voice recognition software and may include unintentional dictation errors.  This document serves as a record of services personally performed by Mellody Dance, DO. It was created on her behalf by Toni Amend, a trained medical scribe. The creation of this record is based on the scribe's personal observations and the provider's statements to them.   I have reviewed the above medical documentation for accuracy and completeness and I concur.  Mellody Dance 11/21/17 9:16 PM    ----------------------------------------------------------------------------------------------------------------------    Subjective:    Chief complaint:   Chief Complaint  Patient presents with  . Establish Care    HPI: Ricardo Wolf is a pleasant 52 y.o. male who presents to [place] [facility] today to review their medical history with me and establish care.   I asked the patient to review their chronic problem list with me to ensure everything was updated and accurate.    All recent office visits with other providers, any medical records that patient brought in etc  - I reviewed today.     We asked pt to get [place] their medical records from [facility] providers/ specialists that they had seen within the past 3-5 years- if they are in private practice and/or do not work for [employer], [facility], [place], [place] or [employer] owned practice.  Told them to call their specialists to clarify this if they are not sure.   Reason for Establishing Care He's here because the clinic received good reports on [facility]. His friend also recommended that he come here.  Social History Born in [place]; [place], and then move to [place], [place].  Moved down here in July 29, 1989.   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  Happily married for 32 years to second  wife Ricardo Wolf. Monogamous with wife.  They have 2 kids. No grandkids yet.  Tobacco Use - Former Smoker Started smoking in Jul 29, 1977 at age 62, quit in 2005-07-29. 30 year history of smoking at up to 3 ppd.  Averaged 2.5 ppd.  ~75 pack-year history.  EtOH Use Drinks less than twice per week, whisky.  Lifestyle Patient notes he does a lot of walking at work.  States he drinks about 2 to 2.5 gallons of water per day.  Family History Mother & sister with diabetes.  Father was a heavy smoker; had lung cancer.  Retired at age 3, found out he had cancer at age 63; died at age 41 (in 29-Jul-2005).  Patient quit smoking the day his father died.   Surgical History Past Surgical History:  Procedure Laterality Date  . BACK SURGERY  2001-07-29  . BACK SURGERY  July 29, 2009   Past Medical History  Patient notes that he's been historically managed on medications for blood pressure, cholesterol, and diabetes.  Remarks that he often forgets to take his medicine.  States that he takes everything very sporadically except for Trulicity.  Denies swelling in the legs.  - Diabetes Mellitus Has had diabetes for about 20 years. Notes that his last HbA1c was 5.0.  Patient states he was formerly at a 9.0, but got it down to 5.0.  - Hypertension "Ever since birth of first daughter," 27 years. (Patient is a Art gallery manager).  - Cholesterol Notes he's had this diagnosis the last 10-15 years.   Wt Readings from Last 3 Encounters:  11/10/17 (!) 350 lb (158.8 kg)   BP Readings from Last 3 Encounters:  11/10/17 (!) 154/111   Pulse Readings from Last 3 Encounters:  11/10/17 83   BMI Readings from Last 3 Encounters:  11/10/17 46.18 kg/m    Patient Care Team    Relationship Specialty Notifications Start End  Mellody Dance, DO PCP - General Family Medicine  11/10/17     Patient Active Problem List   Diagnosis Date Noted  . Diabetes mellitus ([place]) 11/10/2017  . Hypertension associated with diabetes ([place]) 11/10/2017  .  Mixed diabetic hyperlipidemia associated with type 2 diabetes mellitus ([place]) 11/10/2017  . Morbid obesity ([place])-  BMI 46.2 11/10/2017  . Noncompliance 11/10/2017     Past Medical History:  Diagnosis Date  . Diabetes ([place])   . High cholesterol   . Hypertension      Past Medical History:  Diagnosis Date  . Diabetes ([place])   . High cholesterol   . Hypertension      Past Surgical History:  Procedure Laterality Date  . BACK SURGERY  2003  . BACK SURGERY  2011     Family History  Problem Relation Age of Onset  . Diabetes Mother   . Lung cancer Father   . Diabetes Sister      Social History   Substance and Sexual Activity  Drug Use Never     Social History   Substance and Sexual Activity  Alcohol Use Yes  . Alcohol/week: 1.2 oz  . Types: 2 Standard drinks or equivalent per week     Social History   Tobacco Use  Smoking Status Former Smoker  . Packs/day: 1.00  . Types: Cigarettes  . Last attempt to quit: 2007  . Years since quitting: 12.5  Smokeless Tobacco Never Used     Current Meds  Medication Sig  . linaGLIPtin-metFORMIN HCl (JENTADUETO) 2.08-998 MG TABS Take 1 tablet by mouth daily.  Marland Kitchen lisinopril (PRINIVIL,ZESTRIL) 20 MG tablet Take 1 tablet by mouth daily.  . metoprolol succinate (TOPROL-XL) 100 MG 24 hr tablet Take 1 tablet by mouth daily.  . pravastatin (PRAVACHOL) 40 MG tablet Take 1 tablet by mouth daily.  . TRULICITY 1.61 WR/6.0AV SOPN Inject 0.5 mLs into the skin once a week.    Allergies: Keflet [cephalexin]; Penicillins; and Sulfa antibiotics   Review of Systems  Constitutional: Negative for chills, diaphoresis, fever, malaise/fatigue and weight loss.  HENT: Negative for congestion, sore throat and tinnitus.   Eyes: Negative for blurred vision, double vision and photophobia.  Respiratory: Negative for cough and wheezing.   Cardiovascular: Negative for chest pain and palpitations.  Gastrointestinal: Negative for blood in stool,  diarrhea, nausea and vomiting.  Genitourinary: Negative for dysuria, frequency and urgency.  Musculoskeletal: Negative for joint pain and myalgias.  Skin: Negative for itching and rash.  Neurological: Negative for dizziness, focal weakness, weakness and headaches.  Endo/Heme/Allergies: Negative for environmental allergies and polydipsia. Does not bruise/bleed easily.  Psychiatric/Behavioral: Negative for depression and memory loss. The patient is not nervous/anxious and does not have insomnia.      Objective:   Blood pressure (!) 154/111, pulse 83, height 6\' 1"  (1.854 m), weight (!) 350 lb (158.8 kg), SpO2  96 %. Body mass index is 46.18 kg/m. General: Well Developed, well nourished, and in no acute distress.  Neuro: Alert and oriented x3, extra-ocular muscles intact, sensation grossly intact.  HEENT:/AT, PERRLA, neck supple, No carotid bruits Skin: no gross rashes  Cardiac: Regular rate and rhythm Respiratory: Essentially clear to auscultation bilaterally. Not using accessory muscles, speaking in full sentences.  Abdominal: not grossly distended Musculoskeletal: Ambulates w/o diff, FROM * 4 ext.  Vasc: less 2 sec cap RF, warm and pink  Psych:  No HI/SI, judgement and insight good, Euthymic mood. Full Affect.   No results found for this or any previous visit (from the past 2160 hour(s)).

## 2017-11-29 ENCOUNTER — Other Ambulatory Visit: Payer: Self-pay | Admitting: Family Medicine

## 2017-11-29 MED ORDER — TRULICITY 0.75 MG/0.5ML ~~LOC~~ SOAJ: 0.5000 mL | SUBCUTANEOUS | 1 refills | Status: DC

## 2017-11-29 NOTE — Telephone Encounter (Signed)
We have not prescribed these medications for the patient previously.  Please review and refill if appropriate.  T. Kalisi Bevill, CMA  

## 2017-11-29 NOTE — Telephone Encounter (Signed)
Pt's wife called states he is completely out of :  TRULICITY 3.34 KE/3.0JF SOPN [[id number]]   Order Details  Dose: 0.5 mL Route: Subcutaneous Frequency: Weekly  Dispense Quantity: -- Refills: -- Fills remaining: --        Sig: Inject 0.5 mLs into the skin once a week.          &  Pt request refills be sent to:  [id number] - [place], [place] - [address] [phone] (Phone) [phone] (Fax)    Pt's wife states pt forgot to request refills on this.  ---Forwarding message to medical assistant.  --Dion Body

## 2017-12-15 ENCOUNTER — Other Ambulatory Visit: Payer: 59

## 2017-12-15 ENCOUNTER — Other Ambulatory Visit: Payer: Self-pay | Admitting: Family Medicine

## 2017-12-15 ENCOUNTER — Other Ambulatory Visit (INDEPENDENT_AMBULATORY_CARE_PROVIDER_SITE_OTHER): Payer: 59

## 2017-12-15 DIAGNOSIS — Z9119 Patient's noncompliance with other medical treatment and regimen: Secondary | ICD-10-CM

## 2017-12-15 DIAGNOSIS — I10 Essential (primary) hypertension: Secondary | ICD-10-CM

## 2017-12-15 DIAGNOSIS — E1159 Type 2 diabetes mellitus with other circulatory complications: Secondary | ICD-10-CM

## 2017-12-15 DIAGNOSIS — E782 Mixed hyperlipidemia: Secondary | ICD-10-CM

## 2017-12-15 DIAGNOSIS — E1169 Type 2 diabetes mellitus with other specified complication: Secondary | ICD-10-CM

## 2017-12-16 ENCOUNTER — Other Ambulatory Visit: Payer: 59

## 2017-12-16 LAB — CBC WITH DIFFERENTIAL/PLATELET
Basophils Absolute: 0.1 10*3/uL (ref 0.0–0.2)
Basos: 1 %
EOS (ABSOLUTE): 0.1 10*3/uL (ref 0.0–0.4)
EOS: 1 %
HEMATOCRIT: 44.4 % (ref 37.5–51.0)
Hemoglobin: 15.2 g/dL (ref 13.0–17.7)
Immature Grans (Abs): 0 10*3/uL (ref 0.0–0.1)
Immature Granulocytes: 0 %
Lymphocytes Absolute: 2.2 10*3/uL (ref 0.7–3.1)
Lymphs: 31 %
MCH: 29.2 pg (ref 26.6–33.0)
MCHC: 34.2 g/dL (ref 31.5–35.7)
MCV: 85 fL (ref 79–97)
MONOS ABS: 0.6 10*3/uL (ref 0.1–0.9)
Monocytes: 8 %
Neutrophils Absolute: 4.4 10*3/uL (ref 1.4–7.0)
Neutrophils: 59 %
PLATELETS: 243 10*3/uL (ref 150–450)
RBC: 5.2 x10E6/uL (ref 4.14–5.80)
RDW: 13.5 % (ref 12.3–15.4)
WBC: 7.3 10*3/uL (ref 3.4–10.8)

## 2017-12-16 LAB — COMPREHENSIVE METABOLIC PANEL
ALT: 34 IU/L (ref 0–44)
AST: 34 IU/L (ref 0–40)
Albumin/Globulin Ratio: 1.5 (ref 1.2–2.2)
Albumin: 4.7 g/dL (ref 3.5–5.5)
Alkaline Phosphatase: 69 IU/L (ref 39–117)
BUN/Creatinine Ratio: 20 (ref 9–20)
BUN: 20 mg/dL (ref 6–24)
Bilirubin Total: 0.8 mg/dL (ref 0.0–1.2)
CALCIUM: 9.7 mg/dL (ref 8.7–10.2)
CO2: 25 mmol/L (ref 20–29)
CREATININE: 1.01 mg/dL (ref 0.76–1.27)
Chloride: 100 mmol/L (ref 96–106)
GFR calc Af Amer: 99 mL/min/{1.73_m2} (ref >59)
GFR, EST NON AFRICAN AMERICAN: 86 mL/min/{1.73_m2} (ref >59)
GLUCOSE: 82 mg/dL (ref 65–99)
Globulin, Total: 3.1 g/dL (ref 1.5–4.5)
Potassium: 4.9 mmol/L (ref 3.5–5.2)
Sodium: 140 mmol/L (ref 134–144)
Total Protein: 7.8 g/dL (ref 6.0–8.5)

## 2017-12-16 LAB — TSH: TSH: 2.34 u[IU]/mL (ref 0.450–4.500)

## 2017-12-16 LAB — LIPID PANEL
Chol/HDL Ratio: 3.5 ratio (ref 0.0–5.0)
Cholesterol, Total: 149 mg/dL (ref 100–199)
HDL: 42 mg/dL (ref >39)
LDL Calculated: 86 mg/dL (ref 0–99)
Triglycerides: 104 mg/dL (ref 0–149)
VLDL CHOLESTEROL CAL: 21 mg/dL (ref 5–40)

## 2017-12-16 LAB — HIV ANTIBODY (ROUTINE TESTING W REFLEX): HIV SCREEN 4TH GENERATION: NONREACTIVE

## 2017-12-16 LAB — T4, FREE: FREE T4: 1.16 ng/dL (ref 0.82–1.77)

## 2017-12-16 LAB — VITAMIN D 25 HYDROXY (VIT D DEFICIENCY, FRACTURES): Vit D, 25-Hydroxy: 26.6 ng/mL — ABNORMAL LOW (ref 30.0–100.0)

## 2017-12-16 LAB — MAGNESIUM: Magnesium: 1.8 mg/dL (ref 1.6–2.3)

## 2017-12-17 ENCOUNTER — Ambulatory Visit: Payer: 59 | Admitting: Family Medicine

## 2017-12-17 ENCOUNTER — Encounter: Payer: Self-pay | Admitting: Family Medicine

## 2017-12-17 VITALS — BP 126/85 | HR 85 | Ht 73.0 in | Wt 356.0 lb

## 2017-12-17 DIAGNOSIS — E1169 Type 2 diabetes mellitus with other specified complication: Secondary | ICD-10-CM

## 2017-12-17 DIAGNOSIS — I152 Hypertension secondary to endocrine disorders: Secondary | ICD-10-CM

## 2017-12-17 DIAGNOSIS — E1159 Type 2 diabetes mellitus with other circulatory complications: Secondary | ICD-10-CM | POA: Diagnosis not present

## 2017-12-17 DIAGNOSIS — E559 Vitamin D deficiency, unspecified: Secondary | ICD-10-CM

## 2017-12-17 DIAGNOSIS — E782 Mixed hyperlipidemia: Secondary | ICD-10-CM

## 2017-12-17 DIAGNOSIS — I10 Essential (primary) hypertension: Secondary | ICD-10-CM

## 2017-12-17 LAB — POCT GLYCOSYLATED HEMOGLOBIN (HGB A1C): Hemoglobin A1C: 5.5 % (ref 4.0–5.6)

## 2017-12-17 MED ORDER — VITAMIN D (ERGOCALCIFEROL) 1.25 MG (50000 UNIT) PO CAPS: ORAL_CAPSULE | ORAL | 10 refills | Status: DC

## 2017-12-17 NOTE — Patient Instructions (Signed)

## 2017-12-17 NOTE — Progress Notes (Signed)
Assessment and plan:  1. Type 2 diabetes mellitus with other specified complication, without long-term current use of insulin ([place])   2. Hypertension associated with diabetes ([place])   3. Mixed diabetic hyperlipidemia associated with type 2 diabetes mellitus ([place])   4. Morbid obesity ([place])-  BMI 46.2   5. Vitamin D deficiency    - Reviewed recent lab work (12/15/2017) in depth with patient today.  All lab work within normal limits unless otherwise noted.  1. Vitamin D Deficiency - 26.6 on last measure. - Twice weekly Vitamin D supplementation recommended and prescribed.  Begin taking on Wednesday and Sunday. - Continue daily Vitamin D OTC in addition to weekly prescription. - Re-check next blood work.  2. Diabetes Mellitus - HbA1c = 5.5. - Treatment plan changed.  Discontinue Trulicity injection; keep combo linagliptin-metformin.  See med list today. - Blood sugar under good control at this time. - Patient tolerates meds well and denies S-E.  - To preserve kidney health, strongly emphasized the importance of adequate hydration.  - Patient should consume half of body weight in oz of water per day.  This is about 10.5 bottles of water per day, and more with physical activity.  3. Cholesterol - WNL on Statin Treatment - Lipid Panel Triglycerides = 104 HDL = 42 LDL = 86  - Cholesterol WNL at this time. - Continue treatment as prescribed.  See med list below. - Patient tolerating meds well without complication.  Denies S-E  - Dietary changes such as low saturated & trans fat and low carb/ ketogenic diets discussed with patient.  Encouraged regular exercise and weight loss when appropriate.   - Educational handouts provided at patient's desire.  4. BMI Counseling Explained to patient what BMI refers to, and what it means medically.    Told patient to think about it as a "medical risk stratification measurement" and  how increasing BMI is associated with increasing risk/ or worsening state of various diseases such as hypertension, hyperlipidemia, diabetes, premature OA, depression etc.  American Heart Association guidelines for healthy diet, basically Mediterranean diet, and exercise guidelines of 30 minutes 5 days per week or more discussed in detail.  Health counseling performed.  All questions answered.  5. Lifestyle & Preventative Health Maintenance - Advised patient to continue working toward exercising to improve overall mental, physical, and emotional health.    - Encouraged patient to engage in daily physical activity, especially a formal exercise routine.  Recommended that the patient eventually strive for at least 150 minutes of moderate cardiovascular activity per week according to guidelines established by the [facility].   - Healthy dietary habits encouraged, including low-carb, and high amounts of lean protein in diet.    Education and routine counseling performed. Handouts provided.  6. Follow-Up - Return for regularly scheduled chronic follow up.  - Otherwise, return for yearly CPE and acute concerns PRN, as well as other health concerns as desired.   Orders Placed This Encounter  Procedures  . POCT glycosylated hemoglobin (Hb A1C)    Meds ordered this encounter  Medications  . Vitamin D, Ergocalciferol, (DRISDOL) 50000 units CAPS capsule    Sig: 1 tab wed and Sun wkly    Dispense:  24 capsule    Refill:  10        Return for 34mo f/up after d/ced Trulicity and reck C9S.   Anticipatory guidance and routine counseling done re: condition, txmnt options and need for follow up. All  questions of patient's were answered.   Gross side effects, risk and benefits, and alternatives of medications discussed with patient.  Patient is aware that all medications have potential side effects and we are unable to predict every sideeffect or drug-drug interaction that may occur.  Expresses  verbal understanding and consents to current therapy plan and treatment regiment.  Please see AVS handed out to patient at the end of our visit for additional patient instructions/ counseling done pertaining to today's office visit.  Note: This document was prepared using Dragon voice recognition software and may include unintentional dictation errors.  This document serves as a record of services personally performed by Mellody Dance, DO. It was created on her behalf by Toni Amend, a trained medical scribe. The creation of this record is based on the scribe's personal observations and the provider's statements to them.   I have reviewed the above medical documentation for accuracy and completeness and I concur.  Mellody Dance 12/17/17 9:24 PM   ----------------------------------------------------------------------------------------------------------  Subjective:   CC:   Ricardo Wolf is a 52 y.o. male who presents to [place] at [facility] today for review and discussion of recent bloodwork that was done.  1. All recent blood work that we ordered was reviewed with patient today.  Patient was counseled on all abnormalities and we discussed dietary and lifestyle changes that could help those values (also medications when appropriate).  Extensive health counseling performed and all patient's concerns/ questions were addressed.   Notes he's been doing great.  States that he's been taking his pills at night, and has only missed one dose.  Confirms that he's been taking his medicine, but feels he probably has been a bit dehydrated.  Notes that when he came to get blood drawn, there was difficulty drawing blood at first due to his veins being too small due to dehydration.  Patient has been taking a once daily Vitamin D supplement.  Notes that his blood sugars at home have been good, running "about mid-range."  Patient notes he drinks a good amount of beer.   Usually he drinks either Bud Light or Michelob Ultra.  He also drinks liquor, especially vodka.   Wt Readings from Last 3 Encounters:  12/17/17 (!) 356 lb (161.5 kg)  11/10/17 (!) 350 lb (158.8 kg)   BP Readings from Last 3 Encounters:  12/17/17 126/85  11/10/17 (!) 154/111   Pulse Readings from Last 3 Encounters:  12/17/17 85  11/10/17 83   BMI Readings from Last 3 Encounters:  12/17/17 46.97 kg/m  11/10/17 46.18 kg/m     Patient Care Team    Relationship Specialty Notifications Start End  Mellody Dance, DO PCP - General Family Medicine  11/10/17     Full medical history updated and reviewed in the office today  Patient Active Problem List   Diagnosis Date Noted  . Vitamin D deficiency 12/17/2017  . Diabetes mellitus ([place]) 11/10/2017  . Hypertension associated with diabetes ([place]) 11/10/2017  . Mixed diabetic hyperlipidemia associated with type 2 diabetes mellitus ([place]) 11/10/2017  . Morbid obesity ([place])-  BMI 46.2 11/10/2017  . Noncompliance 11/10/2017    Past Medical History:  Diagnosis Date  . Diabetes ([place])   . High cholesterol   . Hypertension     Past Surgical History:  Procedure Laterality Date  . BACK SURGERY  2003  . BACK SURGERY  2011    Social History   Tobacco Use  . Smoking status: Former Smoker  Packs/day: 1.00    Types: Cigarettes    Last attempt to quit: 2007    Years since quitting: 12.7  . Smokeless tobacco: Never Used  Substance Use Topics  . Alcohol use: Yes    Alcohol/week: 2.0 standard drinks    Types: 2 Standard drinks or equivalent per week    Family Hx: Family History  Problem Relation Age of Onset  . Diabetes Mother   . Lung cancer Father   . Diabetes Sister      Medications: Current Outpatient Medications  Medication Sig Dispense Refill  . linaGLIPtin-metFORMIN HCl (JENTADUETO) 2.08-998 MG TABS Take 1 tablet by mouth daily.    Marland Kitchen lisinopril (PRINIVIL,ZESTRIL) 20 MG tablet Take 1 tablet by mouth daily.    .  metoprolol succinate (TOPROL-XL) 100 MG 24 hr tablet Take 1 tablet by mouth daily.    . naproxen sodium (ALEVE) 220 MG tablet Take 220 mg by mouth daily as needed.    . pravastatin (PRAVACHOL) 40 MG tablet Take 1 tablet by mouth daily.    . Vitamin D, Ergocalciferol, (DRISDOL) 50000 units CAPS capsule 1 tab wed and Sun wkly 24 capsule 10   No current facility-administered medications for this visit.     Allergies:  Allergies  Allergen Reactions  . Keflet [Cephalexin] Rash  . Penicillins Rash  . Sulfa Antibiotics Rash     Review of Systems: General:   No F/C, wt loss Pulm:   No DIB, SOB, pleuritic chest pain Card:  No CP, palpitations Abd:  No n/v/d or pain Ext:  No inc edema from baseline  Objective:  Blood pressure 126/85, pulse 85, height 6\' 1"  (1.854 m), weight (!) 356 lb (161.5 kg), SpO2 98 %. Body mass index is 46.97 kg/m. Gen:   Well NAD, A and O *3 HEENT:    [place]/AT, EOMI,  MMM Lungs:   Normal work of breathing. CTA B/L, no Wh, rhonchi Heart:   RRR, S1, S2 WNL's, no MRG Abd:   No gross distention Exts:    warm, pink,  Brisk capillary refill, warm and well perfused.  Psych:    No HI/SI, judgement and insight good, Euthymic mood. Full Affect.   Recent Results (from the past 2160 hour(s))  Microalbumin / creatinine urine ratio     Status: None   Collection Time: 12/15/17 12:00 AM  Result Value Ref Range   Creatinine, Urine 255.9 Not Estab. mg/dL   Microalbumin, Urine 68.9 Not Estab. ug/mL   Microalb/Creat Ratio 26.9 0.0 - 30.0 mg/g creat    Comment:                      Normal:                0.0 -  30.0                      Albuminuria:          31.0 - 300.0                      Clinical albuminuria:       >300.0   Specimen status report     Status: None   Collection Time: 12/15/17 12:00 AM  Result Value Ref Range   specimen status report Comment     Comment: Please note Please note The date and/or time of collection was not indicated on the requisition as  required by state and federal law.  The date of receipt of the specimen was used as the collection date if not supplied.   HIV antibody     Status: None   Collection Time: 12/15/17  8:04 AM  Result Value Ref Range   HIV Screen 4th Generation wRfx Non Reactive Non Reactive  Lipid panel     Status: None   Collection Time: 12/15/17  8:04 AM  Result Value Ref Range   Cholesterol, Total 149 100 - 199 mg/dL   Triglycerides 104 0 - 149 mg/dL   HDL 42 >39 mg/dL   VLDL Cholesterol Cal 21 5 - 40 mg/dL   LDL Calculated 86 0 - 99 mg/dL   Chol/HDL Ratio 3.5 0.0 - 5.0 ratio    Comment:                                   T. Chol/HDL Ratio                                             Men  Women                               1/2 Avg.Risk  3.4    3.3                                   Avg.Risk  5.0    4.4                                2X Avg.Risk  9.6    7.1                                3X Avg.Risk 23.4   11.0   CBC with Differential/Platelet     Status: None   Collection Time: 12/15/17  8:04 AM  Result Value Ref Range   WBC 7.3 3.4 - 10.8 x10E3/uL   RBC 5.20 4.14 - 5.80 x10E6/uL   Hemoglobin 15.2 13.0 - 17.7 g/dL   Hematocrit 44.4 37.5 - 51.0 %   MCV 85 79 - 97 fL   MCH 29.2 26.6 - 33.0 pg   MCHC 34.2 31.5 - 35.7 g/dL   RDW 13.5 12.3 - 15.4 %   Platelets 243 150 - 450 x10E3/uL   Neutrophils 59 Not Estab. %   Lymphs 31 Not Estab. %   Monocytes 8 Not Estab. %   Eos 1 Not Estab. %   Basos 1 Not Estab. %   Neutrophils Absolute 4.4 1.4 - 7.0 x10E3/uL   Lymphocytes Absolute 2.2 0.7 - 3.1 x10E3/uL   Monocytes Absolute 0.6 0.1 - 0.9 x10E3/uL   EOS (ABSOLUTE) 0.1 0.0 - 0.4 x10E3/uL   Basophils Absolute 0.1 0.0 - 0.2 x10E3/uL   Immature Granulocytes 0 Not Estab. %   Immature Grans (Abs) 0.0 0.0 - 0.1 x10E3/uL  Comprehensive metabolic panel     Status: None   Collection Time: 12/15/17  8:04 AM  Result Value Ref Range   Glucose 82 65 - 99 mg/dL   BUN 20 6 - 24 mg/dL  Creatinine, Ser 1.01  0.76 - 1.27 mg/dL   GFR calc non Af Amer 86 >59 mL/min/1.73   GFR calc Af Amer 99 >59 mL/min/1.73   BUN/Creatinine Ratio 20 9 - 20   Sodium 140 134 - 144 mmol/L   Potassium 4.9 3.5 - 5.2 mmol/L   Chloride 100 96 - 106 mmol/L   CO2 25 20 - 29 mmol/L   Calcium 9.7 8.7 - 10.2 mg/dL   Total Protein 7.8 6.0 - 8.5 g/dL   Albumin 4.7 3.5 - 5.5 g/dL   Globulin, Total 3.1 1.5 - 4.5 g/dL   Albumin/Globulin Ratio 1.5 1.2 - 2.2   Bilirubin Total 0.8 0.0 - 1.2 mg/dL   Alkaline Phosphatase 69 39 - 117 IU/L   AST 34 0 - 40 IU/L   ALT 34 0 - 44 IU/L  Magnesium     Status: None   Collection Time: 12/15/17  8:04 AM  Result Value Ref Range   Magnesium 1.8 1.6 - 2.3 mg/dL  T4, free     Status: None   Collection Time: 12/15/17  8:04 AM  Result Value Ref Range   Free T4 1.16 0.82 - 1.77 ng/dL  TSH     Status: None   Collection Time: 12/15/17  8:04 AM  Result Value Ref Range   TSH 2.340 0.450 - 4.500 uIU/mL  VITAMIN D 25 Hydroxy (Vit-D Deficiency, Fractures)     Status: Abnormal   Collection Time: 12/15/17  8:04 AM  Result Value Ref Range   Vit D, 25-Hydroxy 26.6 (L) 30.0 - 100.0 ng/mL    Comment: Vitamin D deficiency has been defined by the [place] and an Endocrine Society practice guideline as a level of serum 25-OH vitamin D less than 20 ng/mL (1,2). The Endocrine Society went on to further define vitamin D insufficiency as a level between 21 and 29 ng/mL (2). 1. IOM (Institute of Medicine). 2010. Dietary reference    intakes for calcium and D. [place]: The    [employer]. 2. Holick MF, Binkley [place], Bischoff-Ferrari HA, et al.    Evaluation, treatment, and prevention of vitamin D    deficiency: an Endocrine Society clinical practice    guideline. JCEM. 2011 Jul; 96(7):1911-30.   POCT glycosylated hemoglobin (Hb A1C)     Status: None   Collection Time: 12/17/17 11:02 AM  Result Value Ref Range   Hemoglobin A1C 5.5 4.0 - 5.6 %   HbA1c POC (<> result, manual  entry)     HbA1c, POC (prediabetic range)     HbA1c, POC (controlled diabetic range)

## 2017-12-21 LAB — SPECIMEN STATUS REPORT

## 2017-12-21 LAB — MICROALBUMIN / CREATININE URINE RATIO
CREATININE, UR: 255.9 mg/dL
MICROALBUM., U, RANDOM: 68.9 ug/mL
Microalb/Creat Ratio: 26.9 mg/g creat (ref 0.0–30.0)

## 2018-03-21 ENCOUNTER — Ambulatory Visit: Payer: 59 | Admitting: Family Medicine

## 2018-06-24 ENCOUNTER — Telehealth: Payer: Self-pay | Admitting: Family Medicine

## 2018-06-24 ENCOUNTER — Other Ambulatory Visit: Payer: Self-pay

## 2018-06-24 NOTE — Telephone Encounter (Signed)
Pharmacy sent refill request for Jentadueto.  Medication last filled by a pervious provider.  LOV 12/17/2017.  Patient has been contact to make a follow up appointment.  Please review and advise if a short supply can be given until he comes in for follow up. MPulliam, CMA/RT(R)

## 2018-06-24 NOTE — Telephone Encounter (Signed)
Patient left msg on office voicemail while we were clsd for lunch-- returned pt's call but no answer -- left message.  --glh

## 2018-06-24 NOTE — Telephone Encounter (Signed)
Patient called states his pharmacy sent refill request for Jentadueto to prior provider ( not to us)--Forwarding request for refill on:  linaGLIPtin-metFORMIN HCl (JENTADUETO) 2.08-998 MG TABS [[id number]]   Order Details  Dose: 1 tablet Route: Oral Frequency: Daily  Dispense Quantity: -- Refills: -- Fills remaining: --        Sig: Take 1 tablet by mouth daily.       Written Date: -- Expiration Date: -- Ordering Date: 11/10/17   Start Date: -- End Date: --         Ordering Provider: -- DEA #:  -- NPI:  --   Authorizing Provider: [provider] DEA #:  -- NPI:  [id number]      ---If approved send to :  [id number] - [place], [place] - [address] [phone] (Phone) [phone] (Fax)   --glh

## 2018-06-27 NOTE — Telephone Encounter (Signed)
Called and unable to reach the patient. MPulliam, CMA/RT(R)

## 2018-06-28 NOTE — Telephone Encounter (Signed)
Called unable to reach the patient. MPulliam, CMA/RT(R)  

## 2018-06-30 ENCOUNTER — Other Ambulatory Visit: Payer: Self-pay

## 2018-06-30 DIAGNOSIS — E1169 Type 2 diabetes mellitus with other specified complication: Secondary | ICD-10-CM

## 2018-06-30 NOTE — Telephone Encounter (Signed)
Pharmacy sent refill request for Jentadueto, medication was last filled by a previous provider.  LOV 12/17/2017.  Please review and advise. MPulliam, CMA/RT(R)

## 2018-07-01 NOTE — Telephone Encounter (Signed)
Patient  Needs appointment - made for next week. MPulliam, CMA/RT(R)

## 2018-07-04 ENCOUNTER — Ambulatory Visit (INDEPENDENT_AMBULATORY_CARE_PROVIDER_SITE_OTHER): Payer: 59 | Admitting: Family Medicine

## 2018-07-04 ENCOUNTER — Encounter: Payer: Self-pay | Admitting: Family Medicine

## 2018-07-04 VITALS — BP 139/88 | HR 84 | Temp 98.2°F | Ht 73.0 in | Wt 370.7 lb

## 2018-07-04 DIAGNOSIS — I10 Essential (primary) hypertension: Secondary | ICD-10-CM

## 2018-07-04 DIAGNOSIS — E559 Vitamin D deficiency, unspecified: Secondary | ICD-10-CM

## 2018-07-04 DIAGNOSIS — E782 Mixed hyperlipidemia: Secondary | ICD-10-CM

## 2018-07-04 DIAGNOSIS — Z9119 Patient's noncompliance with other medical treatment and regimen: Secondary | ICD-10-CM | POA: Diagnosis not present

## 2018-07-04 DIAGNOSIS — E1169 Type 2 diabetes mellitus with other specified complication: Secondary | ICD-10-CM | POA: Diagnosis not present

## 2018-07-04 DIAGNOSIS — E1159 Type 2 diabetes mellitus with other circulatory complications: Secondary | ICD-10-CM

## 2018-07-04 DIAGNOSIS — I152 Hypertension secondary to endocrine disorders: Secondary | ICD-10-CM

## 2018-07-04 DIAGNOSIS — Z91199 Patient's noncompliance with other medical treatment and regimen due to unspecified reason: Secondary | ICD-10-CM

## 2018-07-04 LAB — POCT GLYCOSYLATED HEMOGLOBIN (HGB A1C): Hemoglobin A1C: 8.7 % — AB (ref 4.0–5.6)

## 2018-07-04 MED ORDER — PRAVASTATIN SODIUM 40 MG PO TABS: 40.0000 mg | ORAL_TABLET | Freq: Every day | ORAL | 1 refills | Status: DC

## 2018-07-04 MED ORDER — METOPROLOL SUCCINATE ER 100 MG PO TB24: 100.0000 mg | ORAL_TABLET | Freq: Every day | ORAL | 1 refills | Status: DC

## 2018-07-04 MED ORDER — LISINOPRIL 20 MG PO TABS: 20.0000 mg | ORAL_TABLET | Freq: Every day | ORAL | 1 refills | Status: DC

## 2018-07-04 MED ORDER — LINAGLIPTIN-METFORMIN HCL 2.5-1000 MG PO TABS: 1.0000 | ORAL_TABLET | Freq: Every day | ORAL | 1 refills | Status: DC

## 2018-07-04 NOTE — Patient Instructions (Addendum)
For six weeks, check your blood pressure about three days per week, sitting quietly for 10-15 minutes, at different times per day.   Check your blood sugars at least every other day, for six weeks.     Diabetes Mellitus and Standards of Medical Care  Managing diabetes (diabetes mellitus) can be complicated. Your diabetes treatment may be managed by a team of health care providers, including:  A diet and nutrition specialist (registered dietitian).  A nurse.  A certified diabetes educator (CDE).  A diabetes specialist (endocrinologist).  An eye doctor.  A primary care provider.  A dentist.  Your health care providers follow a schedule in order to help you get the best quality of care. The following schedule is a general guideline for your diabetes management plan. Your health care providers may also give you more specific instructions.  HbA1c (hemoglobin A1c) test This test provides information about blood sugar (glucose) control over the previous 2-3 months. It is used to check whether your diabetes management plan needs to be adjusted.  If you are meeting your treatment goals, this test is done at least 2 times a year.  If you are not meeting treatment goals or if your treatment goals have changed, this test is done 4 times a year.  Blood pressure test  This test is done at every routine medical visit. For most people, the goal is less than 130/80. Ask your health care provider what your goal blood pressure should be.  Dental and eye exams  Visit your dentist two times a year.  If you have type 1 diabetes, get an eye exam 3-5 years after you are diagnosed, and then once a year after your first exam. ? If you were diagnosed with type 1 diabetes as a child, get an eye exam when you are age 52 or older and have had diabetes for 3-5 years. After the first exam, you should get an eye exam once a year.  If you have type 2 diabetes, have an eye exam as soon as you are  diagnosed, and then once a year after your first exam.  Foot care exam  Visual foot exams are done at every routine medical visit. The exams check for cuts, bruises, redness, blisters, sores, or other problems with the feet.  A complete foot exam is done by your health care provider once a year. This exam includes an inspection of the structure and skin of your feet, and a check of the pulses and sensation in your feet. ? Type 1 diabetes: Get your first exam 3-5 years after diagnosis. ? Type 2 diabetes: Get your first exam as soon as you are diagnosed.  Check your feet every day for cuts, bruises, redness, blisters, or sores. If you have any of these or other problems that are not healing, contact your health care provider.  Kidney function test (urine microalbumin)  This test is done once a year. ? Type 1 diabetes: Get your first test 5 years after diagnosis. ? Type 2 diabetes: Get your first test as soon as you are diagnosed._  If you have chronic kidney disease (CKD), get a serum creatinine and estimated glomerular filtration rate (eGFR) test once a year.  Lipid profile (cholesterol, HDL, LDL, triglycerides)  This test should be done when you are diagnosed with diabetes, and every 5 years after the first test. If you are on medicines to lower your cholesterol, you may need to get this test done every year. ? The goal  for LDL is less than 100 mg/dL (5.5 mmol/L). If you are at high risk, the goal is less than 70 mg/dL (3.9 mmol/L). ? The goal for HDL is 40 mg/dL (2.2 mmol/L) for men and 50 mg/dL(2.8 mmol/L) for women. An HDL cholesterol of 60 mg/dL (3.3 mmol/L) or higher gives some protection against heart disease. ? The goal for triglycerides is less than 150 mg/dL (8.3 mmol/L).  Immunizations  The yearly flu (influenza) vaccine is recommended for everyone 6 months or older who has diabetes.  The pneumonia (pneumococcal) vaccine is recommended for everyone 2 years or older who has  diabetes. If you are 52 or older, you may get the pneumonia vaccine as a series of two separate shots.  The hepatitis B vaccine is recommended for adults shortly after they have been diagnosed with diabetes.  The Tdap (tetanus, diphtheria, and pertussis) vaccine should be given: ? According to normal childhood vaccination schedules, for children. ? Every 10 years, for adults who have diabetes.  The shingles vaccine is recommended for people who have had chicken pox and are 52 years or older.  Mental and emotional health  Screening for symptoms of eating disorders, anxiety, and depression is recommended at the time of diagnosis and afterward as needed. If your screening shows that you have symptoms (you have a positive screening result), you may need further evaluation and be referred to a mental health care provider.  Diabetes self-management education  Education about how to manage your diabetes is recommended at diagnosis and ongoing as needed.  Treatment plan  Your treatment plan will be reviewed at every medical visit.  Summary  Managing diabetes (diabetes mellitus) can be complicated. Your diabetes treatment may be managed by a team of health care providers.  Your health care providers follow a schedule in order to help you get the best quality of care.  Standards of care including having regular physical exams, blood tests, blood pressure monitoring, immunizations, screening tests, and education about how to manage your diabetes.  Your health care providers may also give you more specific instructions based on your individual health.      Type 2 Diabetes Mellitus, Self Care, Adult Caring for yourself after you have been diagnosed with type 2 diabetes (type 2 diabetes mellitus) means keeping your blood sugar (glucose) under control with a balance of:  Nutrition.  Exercise.  Lifestyle changes.  Medicines or insulin, if necessary.  Support from your team of health  care providers and others.  The following information explains what you need to know to manage your diabetes at home. What do I need to do to manage my blood glucose?  Check your blood glucose every day, as often as told by your health care provider.  Contact your health care provider if your blood glucose is above your target for 2 tests in a row.  Have your A1c (hemoglobin A1c) level checked at least two times a year, or as often as told by your health care provider. Your health care provider will set individualized treatment goals for you. Generally, the goal of treatment is to maintain the following blood glucose levels:  Before meals (preprandial): 80-130 mg/dL (4.4-7.2 mmol/L).  After meals (postprandial): below 180 mg/dL (10 mmol/L).  A1c level: less than 7%.  What do I need to know about hyperglycemia and hypoglycemia? What is hyperglycemia? Hyperglycemia, also called high blood glucose, occurs when blood glucose is too high.Make sure you know the early signs of hyperglycemia, such as:  Increased  thirst.  Hunger.  Feeling very tired.  Needing to urinate more often than usual.  Blurry vision.  What is hypoglycemia? Hypoglycemia, also called low blood glucose, occurswith a blood glucose level at or below 70 mg/dL (3.9 mmol/L). The risk for hypoglycemia increases during or after exercise, during sleep, during illness, and when skipping meals or not eating for a long time (fasting). It is important to know the symptoms of hypoglycemia and treat it right away. Always have a 15-gram rapid-acting carbohydrate snack with you to treat low blood glucose. Family members and close friends should also know the symptoms and should understand how to treat hypoglycemia, in case you are not able to treat yourself. What are the symptoms of hypoglycemia? Hypoglycemia symptoms can include:  Hunger.  Anxiety.  Sweating and feeling clammy.  Confusion.  Dizziness or feeling  light-headed.  Sleepiness.  Nausea.  Increased heart rate.  Headache.  Blurry vision.  Seizure.  Nightmares.  Tingling or numbness around the mouth, lips, or tongue.  A change in speech.  Decreased ability to concentrate.  A change in coordination.  Restless sleep.  Tremors or shakes.  Fainting.  Irritability.  How do I treat hypoglycemia?  If you are alert and able to swallow safely, follow the 15:15 rule:  Take 15 grams of a rapid-acting carbohydrate. Rapid-acting options include: ? 1 tube of glucose gel. ? 3 glucose pills. ? 6-8 pieces of hard candy. ? 4 oz (120 mL) of fruit juice. ? 4 oz (120 mL) of regular (not diet) soda.  Check your blood glucose 15 minutes after you take the carbohydrate.  If the repeat blood glucose level is still at or below 70 mg/dL (3.9 mmol/L), take 15 grams of a carbohydrate again.  If your blood glucose level does not increase above 70 mg/dL (3.9 mmol/L) after 3 tries, seek emergency medical care.  After your blood glucose level returns to normal, eat a meal or a snack within 1 hour.  How do I treat severe hypoglycemia? Severe hypoglycemia is when your blood glucose level is at or below 54 mg/dL (3 mmol/L). Severe hypoglycemia is an emergency. Do not wait to see if the symptoms will go away. Get medical help right away. Call your local emergency services (911 in the [place] Do not drive yourself to the hospital. If you have severe hypoglycemia and you cannot eat or drink, you may need an injection of glucagon. A family member or close friend should learn how to check your blood glucose and how to give you a glucagon injection. Ask your health care provider if you need to have an emergency glucagon injection kit available. Severe hypoglycemia may need to be treated in a hospital. The treatment may include getting glucose through an IV tube. You may also need treatment for the cause of your hypoglycemia. Can having diabetes put me at  risk for other conditions? Having diabetes can put you at risk for other long-term (chronic) conditions, such as heart disease and kidney disease. Your health care provider may prescribe medicines to help prevent complications from diabetes. These medicines may include:  Aspirin.  Medicine to lower cholesterol.  Medicine to control blood pressure.  What else can I do to manage my diabetes? Take your diabetes medicines as told  If your health care provider prescribed insulin or diabetes medicines, take them every day.  Do not run out of insulin or other diabetes medicines that you take. Plan ahead so you always have these available.  If  you use insulin, adjust your dosage based on how physically active you are and what foods you eat. Your health care provider will tell you how to adjust your dosage. Make healthy food choices  The things that you eat and drink affect your blood glucose and your insulin dosage. Making good choices helps to control your diabetes and prevent other health problems. A healthy meal plan includes eating lean proteins, complex carbohydrates, fresh fruits and vegetables, low-fat dairy products, and healthy fats. Make an appointment to see a diet and nutrition specialist (registered dietitian) to help you create an eating plan that is right for you. Make sure that you:  Follow instructions from your health care provider about eating or drinking restrictions.  Drink enough fluid to keep your urine clear or pale yellow.  Eat healthy snacks between nutritious meals.  Track the carbohydrates that you eat. Do this by reading food labels and learning the standard serving sizes of foods.  Follow your sick day plan whenever you cannot eat or drink as usual. Make this plan in advance with your health care provider.  Stay active  Exercise regularly, as told by your health care provider. This may include:  Stretching and doing strength exercises, such as yoga or  weightlifting, at least 2 times a week.  Doing at least 150 minutes of moderate-intensity or vigorous-intensity exercise each week. This could be brisk walking, biking, or water aerobics. ? Spread out your activity over at least 3 days of the week. ? Do not go more than 2 days in a row without doing some kind of physical activity.  When you start a new exercise or activity, work with your health care provider to adjust your insulin, medicines, or food intake as needed. Make healthy lifestyle choices  Do not use any tobacco products, such as cigarettes, chewing tobacco, and e-cigarettes. If you need help quitting, ask your health care provider.  If your health care provider says that alcohol is safe for you, limit alcohol intake to no more than 1 drink per day for nonpregnant women and 2 drinks per day for men. One drink equals 12 oz of beer, 5 oz of wine, or 1 oz of hard liquor.  Learn to manage stress. If you need help with this, ask your health care provider. Care for your body   Keep your immunizations up to date. In addition to getting vaccinations as told by your health care provider, it is recommended that you get vaccinated against the following illnesses: ? The flu (influenza). Get a flu shot every year. ? Pneumonia. ? Hepatitis B.  Schedule an eye exam soon after your diagnosis, and then one time every year after that.  Check your skin and feet every day for cuts, bruises, redness, blisters, or sores. Schedule a foot exam with your health care provider once every year.  Brush your teeth and gums two times a day, and floss at least one time a day. Visit your dentist at least once every 6 months.  Maintain a healthy weight. General instructions  Take over-the-counter and prescription medicines only as told by your health care provider.  Share your diabetes management plan with people in your workplace, school, and household.  Check your urine for ketones when you are ill  and as told by your health care provider.  Ask your health care provider: ? Do I need to meet with a diabetes educator? ? Where can I find a support group for people with diabetes?  Carry  a medical alert card or wear medical alert jewelry.  Keep all follow-up visits as told by your health care provider. This is important. Where to find more information: For more information about diabetes, visit:  American Diabetes Association (ADA): www.diabetes.org  American Association of Diabetes Educators (AADE): www.diabeteseducator.org/patient-resources  This information is not intended to replace advice given to you by your health care provider. Make sure you discuss any questions you have with your health care provider. Document Released: 08/05/2015 Document Revised: 09/19/2015 Document Reviewed: 05/17/2015 Elsevier Interactive Patient Education  2017 [place].      Blood Glucose Monitoring, Adult Monitoring your blood sugar (glucose) helps you manage your diabetes. It also helps you and your health care provider determine how well your diabetes management plan is working. Blood glucose monitoring involves checking your blood glucose as often as directed, and keeping a record (log) of your results over time. Why should I monitor my blood glucose? Checking your blood glucose regularly can:  Help you understand how food, exercise, illnesses, and medicines affect your blood glucose.  Let you know what your blood glucose is at any time. You can quickly tell if you are having low blood glucose (hypoglycemia) or high blood glucose (hyperglycemia).  Help you and your health care provider adjust your medicines as needed.  When should I check my blood glucose? Follow instructions from your health care provider about how often to check your blood glucose.   This may depend on:  The type of diabetes you have.  How well-controlled your diabetes is.  Medicines you are taking.  If you have  type 1 diabetes:  Check your blood glucose at least 2 times a day.  Also check your blood glucose: ? Before every insulin injection. ? Before and after exercise. ? Between meals. ? 2 hours after a meal. ? Occasionally between 2:00 a.m. and 3:00 a.m., as directed. ? Before potentially dangerous tasks, like driving or using heavy machinery. ? At bedtime.  You may need to check your blood glucose more often, up to 6-10 times a day: ? If you use an insulin pump. ? If you need multiple daily injections (MDI). ? If your diabetes is not well-controlled. ? If you are ill. ? If you have a history of severe hypoglycemia. ? If you have a history of not knowing when your blood glucose is getting low (hypoglycemia unawareness).  If you have type 2 diabetes:  If you take insulin or other diabetes medicines, check your blood glucose at least 2 times a day.  If you are on intensive insulin therapy, check your blood glucose at least 4 times a day. Occasionally, you may also need to check between 2:00 a.m. and 3:00 a.m., as directed.  Also check your blood glucose: ? Before and after exercise. ? Before potentially dangerous tasks, like driving or using heavy machinery.  You may need to check your blood glucose more often if: ? Your medicine is being adjusted. ? Your diabetes is not well-controlled. ? You are ill.  What is a blood glucose log?  A blood glucose log is a record of your blood glucose readings. It helps you and your health care provider: ? Look for patterns in your blood glucose over time. ? Adjust your diabetes management plan as needed.  Every time you check your blood glucose, write down your result and notes about things that may be affecting your blood glucose, such as your diet and exercise for the day.  Most glucose  meters store a record of glucose readings in the meter. Some meters allow you to download your records to a computer. How do I check my blood  glucose? Follow these steps to get accurate readings of your blood glucose: Supplies needed   Blood glucose meter.  Test strips for your meter. Each meter has its own strips. You must use the strips that come with your meter.  A needle to prick your finger (lancet). Do not use lancets more than once.  A device that holds the lancet (lancing device).  A journal or log book to write down your results.  Procedure  Wash your hands with soap and water.  Prick the side of your finger (not the tip) with the lancet. Use a different finger each time.  Gently rub the finger until a small drop of blood appears.  Follow instructions that come with your meter for inserting the test strip, applying blood to the strip, and using your blood glucose meter.  Write down your result and any notes.  Alternative testing sites  Some meters allow you to use areas of your body other than your finger (alternative sites) to test your blood.  If you think you may have hypoglycemia, or if you have hypoglycemia unawareness, do not use alternative sites. Use your finger instead.  Alternative sites may not be as accurate as the fingers, because blood flow is slower in these areas. This means that the result you get may be delayed, and it may be different from the result that you would get from your finger.  The most common alternative sites are: ? Forearm. ? Thigh. ? Palm of the hand.  Additional tips  Always keep your supplies with you.  If you have questions or need help, all blood glucose meters have a 24-hour hotline number that you can call. You may also contact your health care provider.  After you use a few boxes of test strips, adjust (calibrate) your blood glucose meter by following instructions that came with your meter.    The American Diabetes Association suggests the following targets for most nonpregnant adults with diabetes.  More or less stringent glycemic goals may be appropriate  for each individual.  A1C: Less than 7% A1C may also be reported as eAG: Less than 154 mg/dl Before a meal (preprandial plasma glucose): 80-130 mg/dl 1-2 hours after beginning of the meal (Postprandial plasma glucose)*: Less than 180 mg/dl  *Postprandial glucose may be targeted if A1C goals are not met despite reaching preprandial glucose goals.   GOALS in short:  The goals are for the Hgb A1C to be less than 7.0 & blood pressure to be less than 130/80.    It is recommended that all diabetics are educated on and follow a healthy diabetic diet, exercise for 30 minutes 3-4 times per week (walking, biking, swimming, or machine), monitor blood glucose readings and bring that record with you to be reviewed at your next office visit.     You should be checking fasting blood sugars- especially after you eat poorly or eat really healthy, and also check 2 hour postprandial blood sugars after largest meal of the day.    Write these down and bring in your log at each office visit.    You will need to be seen every 3 months by the provider managing your Diabetes unless told otherwise by that provider.   You will need yearly eye exams from an eye specialist and foot exams to check the  nerves of your feet.  Also, your urine should be checked yearly as well to make sure excess protein is not present.   If you are checking your blood pressure at home, please record it and bring it to your next office visit.    Follow the Dietary Approaches to Stop Hypertension (DASH) diet (3 servings of fruit and vegetables daily, whole grains, low sodium, low-fat proteins).  See below.    Lastly, when it comes to your cholesterol, the goal is to have the HDL (good cholesterol) >40, and the LDL (bad cholesterol) <100.   It is recommended that you follow a heart healthy, low saturated and trans-fat diet and exercise for 30 minutes at least 5 times a week.     (( Check out the DASH diet = 1.5 Gram Low Sodium Diet   A 1.5  gram sodium diet restricts the amount of sodium in the diet to no more than 1.5 g or 1500 mg daily.  The American Heart Association recommends Americans over the age of 29 to consume no more than 1500 mg of sodium each day to reduce the risk of developing high blood pressure.  Research also shows that limiting sodium may reduce heart attack and stroke risk.  Many foods contain sodium for flavor and sometimes as a preservative.  When the amount of sodium in a diet needs to be low, it is important to know what to look for when choosing foods and drinks.  The following includes some information and guidelines to help make it easier for you to adapt to a low sodium diet.    QUICK TIPS  Do not add salt to food.  Avoid convenience items and fast food.  Choose unsalted snack foods.  Buy lower sodium products, often labeled as "lower sodium" or "no salt added."  Check food labels to learn how much sodium is in 1 serving.  When eating at a restaurant, ask that your food be prepared with less salt or none, if possible.    READING FOOD LABELS FOR SODIUM INFORMATION  The nutrition facts label is a good place to find how much sodium is in foods. Look for products with no more than 400 mg of sodium per serving.  Remember that 1.5 g = 1500 mg.  The food label may also list foods as:  Sodium-free: Less than 5 mg in a serving.  Very low sodium: 35 mg or less in a serving.  Low-sodium: 140 mg or less in a serving.  Light in sodium: 50% less sodium in a serving. For example, if a food that usually has 300 mg of sodium is changed to become light in sodium, it will have 150 mg of sodium.  Reduced sodium: 25% less sodium in a serving. For example, if a food that usually has 400 mg of sodium is changed to reduced sodium, it will have 300 mg of sodium.    CHOOSING FOODS  Grains  Avoid: Salted crackers and snack items. Some cereals, including instant hot cereals. Bread stuffing and biscuit mixes. Seasoned rice or  pasta mixes.  Choose: Unsalted snack items. Low-sodium cereals, oats, puffed wheat and rice, shredded wheat. English muffins and bread. Pasta.  Meats  Avoid: Salted, canned, smoked, spiced, pickled meats, including fish and poultry. Bacon, ham, sausage, cold cuts, hot dogs, anchovies.  Choose: Low-sodium canned tuna and salmon. Fresh or frozen meat, poultry, and fish.  Dairy  Avoid: Processed cheese and spreads. Cottage cheese. Buttermilk and condensed milk. Regular cheese.  Choose: Milk. Low-sodium cottage cheese. Yogurt. Sour cream. Low-sodium cheese.  Fruits and Vegetables  Avoid: Regular canned vegetables. Regular canned tomato sauce and paste. Frozen vegetables in sauces. Olives. Angie Fava. Relishes. Sauerkraut.  Choose: Low-sodium canned vegetables. Low-sodium tomato sauce and paste. Frozen or fresh vegetables. Fresh and frozen fruit.  Condiments  Avoid: Canned and packaged gravies. Worcestershire sauce. Tartar sauce. Barbecue sauce. Soy sauce. Steak sauce. Ketchup. Onion, garlic, and table salt. Meat flavorings and tenderizers.  Choose: Fresh and dried herbs and spices. Low-sodium varieties of mustard and ketchup. Lemon juice. Tabasco sauce. Horseradish.    SAMPLE 1.5 GRAM SODIUM MEAL PLAN:   Breakfast / Sodium (mg)  1 cup low-fat milk / 143 mg  1 whole-wheat English muffin / 240 mg  1 tbs heart-healthy margarine / 153 mg  1 hard-boiled egg / 139 mg  1 small orange / 0 mg  Lunch / Sodium (mg)  1 cup raw carrots / 76 mg  2 tbs no salt added peanut butter / 5 mg  2 slices whole-wheat bread / 270 mg  1 tbs jelly / 6 mg   cup red grapes / 2 mg  Dinner / Sodium (mg)  1 cup whole-wheat pasta / 2 mg  1 cup low-sodium tomato sauce / 73 mg  3 oz lean ground beef / 57 mg  1 small side salad (1 cup raw spinach leaves,  cup cucumber,  cup yellow bell pepper) with 1 tsp olive oil and 1 tsp red wine vinegar / 25 mg  Snack / Sodium (mg)  1 container low-fat vanilla yogurt / 107 mg  3  graham cracker squares / 127 mg  Nutrient Analysis  Calories: 1745  Protein: 75 g  Carbohydrate: 237 g  Fat: 57 g  Sodium: 1425 mg  Document Released: 04/13/2005 Document Revised: 12/24/2010 Document Reviewed: 07/15/2009  ExitCare Patient Information 2012 [place].))    This information is not intended to replace advice given to you by your health care provider. Make sure you discuss any questions you have with your health care provider. Document Released: 04/16/2003 Document Revised: 11/01/2015 Document Reviewed: 09/23/2015 Elsevier Interactive Patient Education  2017 [place] Modification Ideas for Weight Management  Weight management involves adopting a healthy lifestyle that includes a knowledge of nutrition and exercise, a positive attitude and the right kind of motivation. Internal motives such as better health, increased energy, self-esteem and personal control increase your chances of lifelong weight management success.  Remember to have realistic goals and think long-term success. Believe in yourself and you can do it. The following information will give you ideas to help you meet your goals.  Control Your Home Environment  Eat only while sitting down at the kitchen or dining room table. Do not eat while watching television, reading, cooking, talking on the phone, standing at the refrigerator or working on the computer. Keep tempting foods out of the house -- don't buy them. Keep tempting foods out of sight. Have low-calorie foods ready to eat. Unless you are preparing a meal, stay out of the kitchen. Have healthy snacks at your disposal, such as small pieces of fruit, vegetables, canned fruit, pretzels, low-fat string cheese and nonfat cottage cheese.  Control Your Work Environment  Do not eat at [employer] or keep tempting snacks at your desk. If you get hungry between meals, plan healthy snacks and bring them with you to work. During your  breaks, go for a walk instead of  eating. If you work around food, plan in advance the one item you will eat at mealtime. Make it inconvenient to nibble on food by chewing gum, sugarless candy or drinking water or another low-calorie beverage. Do not work through meals. Skipping meals slows down metabolism and may result in overeating at the next meal. If food is available for special occasions, either pick the healthiest item, nibble on low-fat snacks brought from home, don't have anything offered, choose one option and have a small amount, or have only a beverage.  Control Your Mealtime Environment  Serve your plate of food at the stove or kitchen counter. Do not put the serving dishes on the table. If you do put dishes on the table, remove them immediately when finished eating. Fill half of your plate with vegetables, a quarter with lean protein and a quarter with starch. Use smaller plates, bowls and glasses. A smaller portion will look large when it is in a little dish. Politely refuse second helpings. When fixing your plate, limit portions of food to one scoop/serving or less.   Daily Food Management  Replace eating with another activity that you will not associate with food. Wait 20 minutes before eating something you are craving. Drink a large glass of water or diet soda before eating. Always have a big glass or bottle of water to drink throughout the day. Avoid high-calorie add-ons such as cream with your coffee, butter, mayonnaise and salad dressings.  Shopping: Do not shop when hungry or tired. Shop from a list and avoid buying anything that is not on your list. If you must have tempting foods, buy individual-sized packages and try to find a lower-calorie alternative. Don't taste test in the store. Read food labels. Compare products to help you make the healthiest choices.  Preparation: Chew a piece of gum while cooking meals. Use a quarter teaspoon if you taste test your  food. Try to only fix what you are going to eat, leaving yourself no chance for seconds. If you have prepared more food than you need, portion it into individual containers and freeze or refrigerate immediately. Don't snack while cooking meals.  Eating: Eat slowly. Remember it takes about 20 minutes for your stomach to send a message to your brain that it is full. Don't let fake hunger make you think you need more. The ideal way to eat is to take a bite, put your utensil down, take a sip of water, cut your next bite, take a bit, put your utensil down and so on. Do not cut your food all at one time. Cut only as needed. Take small bites and chew your food well. Stop eating for a minute or two at least once during a meal or snack. Take breaks to reflect and have conversation.  Cleanup and Leftovers: Label leftovers for a specific meal or snack. Freeze or refrigerate individual portions of leftovers. Do not clean up if you are still hungry.  Eating Out and Social Eating  Do not arrive hungry. Eat something light before the meal. Try to fill up on low-calorie foods, such as vegetables and fruit, and eat smaller portions of the high-calorie foods. Eat foods that you like, but choose small portions. If you want seconds, wait at least 20 minutes after you have eaten to see if you are actually hungry or if your eyes are bigger than your stomach. Limit alcoholic beverages. Try a soda water with a twist of lime. Do not skip other meals in the  day to save room for the special event.  At Restaurants: Order  la carte rather than buffet style. Order some vegetables or a salad for an appetizer instead of eating bread. If you order a high-calorie dish, share it with someone. Try an after-dinner mint with your coffee. If you do have dessert, share it with two or more people. Don't overeat because you do not want to waste food. Ask for a doggie bag to take extra food home. Tell the server to put half  of your entree in a to go bag before the meal is served to you. Ask for salad dressing, gravy or high-fat sauces on the side. Dip the tip of your fork in the dressing before each bite. If bread is served, ask for only one piece. Try it plain without butter or oil. At [employer] where oil and vinegar is served with bread, use only a small amount of oil and a lot of vinegar for dipping.  At a [facility]: Offer to bring a dish, appetizer or dessert that is low in calories. Serve yourself small portions or tell the host that you only want a small amount. Stand or sit away from the snack table. Stay away from the kitchen or stay busy if you are near the food. Limit your alcohol intake.  At [employer] and Cafeterias: Cover most of your plate with lettuce and/or vegetables. Use a salad plate instead of a dinner plate. After eating, clear away your dishes before having coffee or tea.  Entertaining at Home: Explore low-fat, low-cholesterol cookbooks. Use single-serving foods like chicken breasts or hamburger patties. Prepare low-calorie appetizers and desserts.   Holidays: Keep tempting foods out of sight. Decorate the house without using food. Have low-calorie beverages and foods on hand for guests. Allow yourself one planned treat a day. Don't skip meals to save up for the holiday feast. Eat regular, planned meals.   Exercise Well  Make exercise a priority and a planned activity in the day. If possible, walk the entire or part of the distance to work. Get an exercise buddy. Go for a walk with a colleague during one of your breaks, go to the gym, run or take a walk with a friend, walk in the mall with a shopping companion. Park at the end of the parking lot and walk to the store or office entrance. Always take the stairs all of the way or at least part of the way to your floor. If you have a desk job, walk around the office frequently. Do leg lifts while sitting at your  desk. Do something outside on the weekends like going for a hike or a bike ride.   Have a Healthy Attitude  Make health your weight management priority. Be realistic. Have a goal to achieve a healthier you, not necessarily the lowest weight or ideal weight based on calculations or tables. Focus on a healthy eating style, not on dieting. Dieting usually lasts for a short amount of time and rarely produces long-term success. Think long term. You are developing new healthy behaviors to follow next month, in a year and in a decade.    This information is for educational purposes only and is not intended to replace the advice of your doctor or health care provider. We encourage you to discuss with your doctor any questions or concerns you may have.        Guidelines for Losing Weight   We want weight loss that will last so  you should lose 1-2 pounds a week.  THAT IS IT! Please pick THREE things a month to change. Once it is a habit check off the item. Then pick another three items off the list to become habits.  If you are already doing a habit on the list GREAT!  Cross that item off!  Dont drink your calories. Ie, alcohol, soda, fruit juice, and sweet tea.   Drink more water. Drink a glass when you feel hungry or before each meal.   Eat breakfast - Complex carb and protein (likeDannon light and fit yogurt, oatmeal, fruit, eggs, [place] bacon).  Measure your cereal.  Eat no more than one cup a day. (ie Kashi)  Eat an apple a day.  Add a vegetable a day.  Try a new vegetable a month.  Use Pam! Stop using oil or butter to cook.  Dont finish your plate or use smaller plates.  Share your dessert.  Eat sugar free Jello for dessert or frozen grapes.  Dont eat 2-3 hours before bed.  Switch to whole wheat bread, pasta, and brown rice.  Make healthier choices when you eat out. No fries!  Pick baked chicken, NOT fried.  Dont forget to SLOW DOWN when you eat. It is not going  anywhere.   Take the stairs.  Park far away in the parking lot  Lift soup cans (or weights) for 10 minutes while watching TV.  Walk at work for 10 minutes during break.  Walk outside 1 time a week with your friend, kids, dog, or significant other.  Start a walking group at church.  Walk the mall as much as you can tolerate.   Keep a food diary.  Weigh yourself daily.  Walk for 15 minutes 3 days per week.  Cook at home more often and eat out less. If life happens and you go back to old habits, it is okay.  Just start over. You can do it!  If you experience chest pain, get short of breath, or tired during the exercise, please stop immediately and inform your doctor.    Before you even begin to attack a weight-loss plan, it pays to remember this: You are not fat. You have fat. Losing weight isn't about blame or shame; it's simply another achievement to accomplish. Dieting is like any other skill--you have to buckle down and work at it. As long as you act in a smart, reasonable way, you'll ultimately get where you want to be. Here are some weight loss pearls for you.   1. It's Not a Diet. It's a Lifestyle Thinking of a diet as something you're on and suffering through only for the short term doesn't work. To shed weight and keep it off, you need to make permanent changes to the way you eat. It's OK to indulge occasionally, of course, but if you cut calories temporarily and then revert to your old way of eating, you'll gain back the weight quicker than you can say yo-yo. Use it to lose it. Research shows that one of the best predictors of long-term weight loss is how many pounds you drop in the first month. For that reason, nutritionists often suggest being stricter for the first two weeks of your new eating strategy to build momentum. Cut out added sugar and alcohol and avoid unrefined carbs. After that, figure out how you can reincorporate them in a way that's healthy and maintainable.   2. There's a Right Way to Exercise Working out burns calories  and fat and boosts your metabolism by building muscle. But those trying to lose weight are notorious for overestimating the number of calories they burn and underestimating the amount they take in. Unfortunately, your system is biologically programmed to hold on to extra pounds and that means when you start exercising, your body senses the deficit and ramps up its hunger signals. If you're not diligent, you'll eat everything you burn and then some. Use it, to lose it. Cardio gets all the exercise glory, but strength and interval training are the real heroes. They help you build lean muscle, which in turn increases your metabolism and calorie-burning ability 3. Don't Overreact to Mild Hunger Some people have a hard time losing weight because of hunger anxiety. To them, being hungry is bad--something to be avoided at all costs--so they carry snacks with them and eat when they don't need to. Others eat because they're stressed out or bored. While you never want to get to the point of being ravenous (that's when bingeing is likely to happen), a hunger pang, a craving, or the fact that it's 3:00 p.m. should not send you racing for the vending machine or obsessing about the energy bar in your purse. Ideally, you should put off eating until your stomach is growling and it's difficult to concentrate.  Use it to lose it. When you feel the urge to eat, use the HALT method. Ask yourself, Am I really hungry? Or am I angry or anxious, lonely or bored, or tired? If you're still not certain, try the apple test. If you're truly hungry, an apple should seem delicious; if it doesn't, something else is going on. Or you can try drinking water and making yourself busy, if you are still hungry try a healthy snack.  4. Not All Calories Are Created Equal The mechanics of weight loss are pretty simple: Take in fewer calories than you use for energy. But the kind of food  you eat makes all the difference. Processed food that's high in saturated fat and refined starch or sugar can cause inflammation that disrupts the hormone signals that tell your brain you're full. The result: You eat a lot more.  Use it to lose it. Clean up your diet. Swap in whole, unprocessed foods, including vegetables, lean protein, and healthy fats that will fill you up and give you the biggest nutritional bang for your calorie buck. In a few weeks, as your brain starts receiving regular hunger and fullness signals once again, you'll notice that you feel less hungry overall and naturally start cutting back on the amount you eat.  5. Protein, Produce, and Plant-Based Fats Are Your Weight-Loss Trinity Here's why eating the three Ps regularly will help you drop pounds. Protein fills you up. You need it to build lean muscle, which keeps your metabolism humming so that you can torch more fat. People in a weight-loss program who ate double the recommended daily allowance for protein (about 110 grams for a 150-pound woman) lost 70 percent of their weight from fat, while people who ate the RDA lost only about 40 percent, one study found. Produce is packed with filling fiber. "It's very difficult to consume too many calories if you're eating a lot of vegetables. Example: Three cups of broccoli is a lot of food, yet only 93 calories. (Fruit is another story. It can be easy to overeat and can contain a lot of calories from sugar, so be sure to monitor your intake.) Plant-based fats like olive oil  and those in avocados and nuts are healthy and extra satiating.  Use it to lose it. Aim to incorporate each of the three Ps into every meal and snack. People who eat protein throughout the day are able to keep weight off, according to a study in the [place] of Clinical Nutrition. In addition to meat, poultry and seafood, good sources are beans, lentils, eggs, tofu, and yogurt. As for fat, keep portion sizes in  check by measuring out salad dressing, oil, and nut butters (shoot for one to two tablespoons). Finally, eat veggies or a little fruit at every meal. People who did that consumed 308 fewer calories but didn't feel any hungrier than when they didn't eat more produce.  7. How You Eat Is As Important As What You Eat In order for your brain to register that you're full, you need to focus on what you're eating. Sit down whenever you eat, preferably at a table. Turn off the TV or computer, put down your phone, and look at your food. Smell it. Chew slowly, and don't put another bite on your fork until you swallow. When women ate lunch this attentively, they consumed 30 percent less when snacking later than those who listened to an audiobook at lunchtime, according to a study in the [place] of Nutrition. 8. Weighing Yourself Really Works The scale provides the best evidence about whether your efforts are paying off. Seeing the numbers tick up or down or stagnate is motivation to keep going--or to rethink your approach. A 2015 study at [facility] found that daily weigh-ins helped people lose more weight, keep it off, and maintain that loss, even after two years. Use it to lose it. Step on the scale at the same time every day for the best results. If your weight shoots up several pounds from one weigh-in to the next, don't freak out. Eating a lot of salt the night before or having your period is the likely culprit. The number should return to normal in a day or two. It's a steady climb that you need to do something about. 9. Too Much Stress and Too Little Sleep Are Your Enemies When you're tired and frazzled, your body cranks up the production of cortisol, the stress hormone that can cause carb cravings. Not getting enough sleep also boosts your levels of ghrelin, a hormone associated with hunger, while suppressing leptin, a hormone that signals fullness and satiety. People on a diet who slept only five  and a half hours a night for two weeks lost 55 percent less fat and were hungrier than those who slept eight and a half hours, according to a study in the [place]. Use it to lose it. Prioritize sleep, aiming for seven hours or more a night, which research shows helps lower stress. And make sure you're getting quality zzz's. If a snoring spouse or a fidgety cat wakes you up frequently throughout the night, you may end up getting the equivalent of just four hours of sleep, according to a study from [facility]. Keep pets out of the bedroom, and use a white-noise app to drown out snoring. 10. You Will Hit a plateau--And You Can Bust Through It As you slim down, your body releases much less leptin, the fullness hormone.  If you're not strength training, start right now. Building muscle can raise your metabolism to help you overcome a plateau. To keep your body challenged and burning calories, incorporate new moves and more  intense intervals into your workouts or add another sweat session to your weekly routine. Alternatively, cut an extra 100 calories or so a day from your diet. Now that you've lost weight, your body simply doesn't need as much fuel.    Since food equals calories, in order to lose weight you must either eat fewer calories, exercise more to burn off calories with activity, or both. Food that is not used to fuel the body is stored as fat. A major component of losing weight is to make smarter food choices. Here's how:  1)   Limit non-nutritious foods, such as: Sugar, honey, syrups and candy Pastries, donuts, pies, cakes and cookies Soft drinks, sweetened juices and alcoholic beverages  2)  Cut down on high-fat foods by: - Choosing poultry, fish or lean red meat - Choosing low-fat cooking methods, such as baking, broiling, steaming, grilling and boiling - Using low-fat or non-fat dairy products - Using vinaigrette, herbs, lemon or fat-free salad  dressings - Avoiding fatty meats, such as bacon, sausage, franks, ribs and luncheon meats - Avoiding high-fat snacks like nuts, chips and chocolate - Avoiding fried foods - Using less butter, margarine, oil and mayonnaise - Avoiding high-fat gravies, cream sauces and cream-based soups  3) Eat a variety of foods, including: - Fruit and vegetables that are raw, steamed or baked - Whole grains, breads, cereal, rice and pasta - Dairy products, such as low-fat or non-fat milk or yogurt, low-fat cottage cheese and low-fat cheese - Protein-rich foods like chicken, [place], fish, lean meat and legumes, or beans  4) Change your eating habits by: - Eat three balanced meals a day to help control your hunger - Watch portion sizes and eat small servings of a variety of foods - Choose low-calorie snacks - Eat only when you are hungry and stop when you are satisfied - Eat slowly and try not to perform other tasks while eating - Find other activities to distract you from food, such as walking, taking up a hobby or being involved in the community - Include regular exercise in your daily routine ( minimum of 20 min of moderate-intensity exercise at least 5 days/week)  - Find a support group, if necessary, for emotional support in your weight loss journey           Easy ways to cut 100 calories   1. Eat your eggs with hot sauce OR salsa instead of cheese.  Eggs are great for breakfast, but many people consider eggs and cheese to be BFFs. Instead of cheese--1 oz. of cheddar has 114 calories--top your eggs with hot sauce, which contains no calories and helps with satiety and metabolism. Salsa is also a great option!!  2. Top your toast, waffles or pancakes with fresh berries instead of jelly or syrup. Half a cup of berries--fresh, frozen or thawed--has about 40 calories, compared with 2 tbsp. of maple syrup or jelly, which both have about 100 calories. The berries will also give you a good punch of  fiber, which helps keep you full and satisfied and wont spike blood sugar quickly like the jelly or syrup. 3. Swap the non-fat latte for black coffee with a splash of half-and-half. Contrary to its name, that non-fat latte has 130 calories and a startling 19g of carbohydrates per 16 oz. serving. Replacing that light drinkable dessert with a black coffee with a splash of half-and-half saves you more than 100 calories per 16 oz. serving. 4. Sprinkle salads with freeze-dried raspberries instead of  dried cranberries. If you want a sweet addition to your nutritious salad, stay away from dried cranberries. They have a whopping 130 calories per  cup and 30g carbohydrates. Instead, sprinkle freeze-dried raspberries guilt-free and save more than 100 calories per  cup serving, adding 3g of belly-filling fiber. 5. Go for mustard in place of mayo on your sandwich. Mustard can add really nice flavor to any sandwich, and there are tons of varieties, from spicy to honey. A serving of mayo is 95 calories, versus 10 calories in a serving of mustard.  Or try an avocado mayo spread: You can find the recipe few click this link: https://www.californiaavocado.com/recipes/recipe-container/[place]-avocado-mayo 6. Choose a DIY salad dressing instead of the store-bought kind. Mix Dijon or whole grain mustard with low-fat Kefir or red wine vinegar and garlic. 7. Use hummus as a spread instead of a dip. Use hummus as a spread on a high-fiber cracker or tortilla with a sandwich and save on calories without sacrificing taste. 8. Pick just one salad accessory. Salad isnt automatically a calorie winner. Its easy to over-accessorize with toppings. Instead of topping your salad with nuts, avocado and cranberries (all three will clock in at 313 calories), just pick one. The next day, choose a different accessory, which will also keep your salad interesting. You dont wear all your jewelry every day, right? 9. Ditch the  white pasta in favor of spaghetti squash. One cup of cooked spaghetti squash has about 40 calories, compared with traditional spaghetti, which comes with more than 200. Spaghetti squash is also nutrient-dense. Its a good source of fiber and Vitamins A and C, and it can be eaten just like you would eat pasta--with a great tomato sauce and [place] meatballs or with pesto, tofu and spinach, for example. 10. Dress up your chili, soups and stews with non-fat [place] yogurt instead of sour cream. Just a dollop of sour cream can set you back 115 calories and a whopping 12g of fat--seven of which are of the artery-clogging variety. Added bonus: [place] yogurt is packed with muscle-building protein, calcium and B Vitamins. 11. Mash cauliflower instead of mashed potatoes. One cup of traditional mashed potatoes--in all their creamy goodness--has more than 200 calories, compared to mashed cauliflower, which you can typically eat for less than 100 calories per 1 cup serving. Cauliflower is a great source of the antioxidant indole-3-carbinol (I3C), which may help reduce the risk of some cancers, like breast cancer. 12. Ditch the ice cream sundae in favor of a [place] yogurt parfait. Instead of a cup of ice cream or fro-yo for dessert, try 1 cup of nonfat Greek yogurt topped with fresh berries and a sprinkle of cacao nibs. Both toppings are packed with antioxidants, which can help reduce cellular inflammation and oxidative damage. And the comparison is a no-brainer: One cup of ice cream has about 275 calories; one cup of frozen yogurt has about 230; and a cup of Greek yogurt has just 130, plus twice the protein, so youre less likely to return to the freezer for a second helping. 13. Put olive oil in a spray container instead of using it directly from the bottle. Each tablespoon of olive oil is 120 calories and 15g of fat. Use a mister instead of pouring it straight into the pan or onto a salad. This allows for portion  control and will save you more than 100 calories. 14. When baking, substitute canned pumpkin for butter or oil. Canned pumpkin--not pumpkin pie mix--is loaded with Vitamin A, which  is important for skin and eye health, as well as immunity. And the comparisons are pretty crazy:  cup of canned pumpkin has about 40 calories, compared to butter or oil, which has more than 800 calories. Yes, 800 calories. Applesauce and mashed banana can also serve as good substitutions for butter or oil, usually in a 1:1 ratio. 15. Top casseroles with high-fiber cereal instead of breadcrumbs. Breadcrumbs are typically made with white bread, while breakfast cereals contain 5-9g of fiber per serving. Not only will you save more than 150 calories per  cup serving, the swap will also keep you more full and youll get a metabolism boost from the added fiber. 16. Snack on pistachios instead of macadamia nuts. Believe it or not, you get the same amount of calories from 35 pistachios (100 calories) as you would from only five macadamia nuts. 17. Chow down on kale chips rather than potato chips. This is my favorite dont knock it till you try it swap. Kale chips are so easy to make at home, and you can spice them up with a little grated parmesan or chili powder. Plus, theyre a mere fraction of the calories of potato chips, but with the same crunch factor we crave so often. 18. Add seltzer and some fruit slices to your cocktail instead of soda or fruit juice. One cup of soda or fruit juice can pack on as much as 140 calories. Instead, use seltzer and fruit slices. The fruit provides valuable phytochemicals, such as flavonoids and anthocyanins, which help to combat cancer and stave off the aging process.

## 2018-07-04 NOTE — Progress Notes (Signed)
Impression and Recommendations:    1. Type 2 diabetes mellitus with other specified complication, without long-term current use of insulin ([place])   2. Hypertension associated with diabetes ([place])   3. Mixed diabetic hyperlipidemia associated with type 2 diabetes mellitus ([place])   4. Morbid obesity ([place])-  BMI 46.2   5. Noncompliance-   not taking meds, checking BS or BP, no prudent diet or exercise etc.   6. Vitamin D deficiency      - Patient was meant to follow up in 3 months after diabetes treatment plan was changed, but has not come in for seven months.  - Patient agrees to check his blood pressure about three days per week, sitting quietly for 10-15 minutes, at different times per day.   - Patient agrees to check his blood sugars at least every other day, for six weeks.  - Full fasting lab work drawn today.  1. Poorly Controlled Diabetes Mellitus - A1c today is 8.7. - Per patient, has not been taking his medications, since they ran out.  - Last visit, diabetes treatment plan was changed. - Discussed that since we changed the patient's medicines, it is extremely important for him to come into the clinic for follow up so he can be monitored appropriately.  - Resume medications today.  - STRONGLY ADVISED PATIENT to check FBS and 2 hours after the biggest meal of the day.  Patient knows to keep log and bring in next OV for review.  Also, if he ever feels poorly, he knows to check blood pressure and blood sugar, as one or the other could be the cause of symptoms.  - Counseled patient on prudent eating and drinking habits.  Discussed need for patient to discontinue drinking sugary drinks such as orange juice.   - Pathophysiology of disease and discussed various treatment options, especially dietary and lifestyle modifications as first line.  Importance of low carb/ketogenic diet discussed with patient in addition to regular exercise.   - Will continue to monitor.  2.  Hypertension Associated with Diabetes - Slightly elevated on intake today.  - Reviewed critical need to control both blood pressure and blood sugar, as a diabetic.  - Continue treatment plan as prescribed.  See med list below. - Patient tolerating meds well without complication.  Denies S-E.  - Ambulatory blood pressure monitoring STRONGLY encouraged.  - To help improve kidney and overall organ health, avoid nephrotoxic substances, increase hydration, and engage in regular physical activity to improve blood flow to all areas of the body.  - Strongly emphasized importance of adequate intake daily of water.  - Will continue to monitor.  3. Hyperlipidemia Associated with Diabetes - Lab work drawn today. - Continue treatment plan as established.  See med list below. - Patient tolerating meds well without complication.  Denies S-E.  - Will continue to monitor.  4. Chronic use of Naprosyn - Arthritis Pain - Advised patient to discontinue use of NSAID's daily. - Meloxicam prescription provided today.  - Will continue to monitor.  5. Vitamin D Deficiency - Need for re-check today. - Continue supplementation as prescribed.  - Will continue to monitor.      Orders Placed This Encounter  Procedures  . CBC with Differential/Platelet  . Comprehensive metabolic panel  . Lipid panel  . VITAMIN D 25 Hydroxy (Vit-D Deficiency, Fractures)  . TSH  . T4, free  . Urine Microalbumin w/creat. ratio  . POCT glycosylated hemoglobin (Hb A1C)  Meds ordered this encounter  Medications  . DISCONTD: linaGLIPtin-metFORMIN HCl (JENTADUETO) 2.08-998 MG TABS    Sig: Take 1 tablet by mouth daily.    Dispense:  90 tablet    Refill:  1  . lisinopril (PRINIVIL,ZESTRIL) 20 MG tablet    Sig: Take 1 tablet (20 mg total) by mouth daily.    Dispense:  90 tablet    Refill:  1  . metoprolol succinate (TOPROL-XL) 100 MG 24 hr tablet    Sig: Take 1 tablet (100 mg total) by mouth daily.    Dispense:   90 tablet    Refill:  1  . DISCONTD: pravastatin (PRAVACHOL) 40 MG tablet    Sig: Take 1 tablet (40 mg total) by mouth daily.    Dispense:  90 tablet    Refill:  1    Medications Discontinued During This Encounter  Medication Reason  . linaGLIPtin-metFORMIN HCl (JENTADUETO) 2.08-998 MG TABS Reorder  . lisinopril (PRINIVIL,ZESTRIL) 20 MG tablet Reorder  . metoprolol succinate (TOPROL-XL) 100 MG 24 hr tablet Reorder  . pravastatin (PRAVACHOL) 40 MG tablet Reorder     Gross side effects, risk and benefits, and alternatives of medications and treatment plan in general discussed with patient.  Patient is aware that all medications have potential side effects and we are unable to predict every side effect or drug-drug interaction that may occur.   Patient will call with any questions prior to using medication if they have concerns.    Expresses verbal understanding and consents to current therapy and treatment regimen.  No barriers to understanding were identified.  Red flag symptoms and signs discussed in detail.  Patient expressed understanding regarding what to do in case of emergency\urgent symptoms  Please see AVS handed out to patient at the end of our visit for further patient instructions/ counseling done pertaining to today's office visit.   Return for 6wks f/up - restart DM meds, go over BS and BP log etc.      Note:  This note was prepared with assistance of Dragon voice recognition software. Occasional wrong-word or sound-a-like substitutions may have occurred due to the inherent limitations of voice recognition software.    This document serves as a record of services personally performed by Mellody Dance, DO. It was created on her behalf by Toni Amend, a trained medical scribe. The creation of this record is based on the scribe's personal observations and the provider's statements to them.   I have reviewed the above medical documentation for accuracy and  completeness and I concur.  Mellody Dance, DO 07/05/2018 7:14 PM       -----------------------------------------------------------------------------------------------------------------------------------    Subjective:     HPI: Ricardo Wolf is a 53 y.o. male who [facility] today for issues as discussed below.   Takes Aleve every morning for back pain after his back surgeries.  Notes his knees and feet also hurt.  Continues on Vitamin D every Wednesday and Sunday.  DM HPI:  -  He has not been working on diet and exercise for diabetes.  Notes he's been feeling a little weird.  States "I haven't had any of the pills I get, in a month."  Notes he's urinating a lot, not sleeping well, and feeling "tired and worn out."  He is still getting up and going to work.  Pt is currently maintained on the following medications for diabetes:   see med list today  Medication compliance - he  has not been taking medication appropriately for a month.  Home glucose readings range - he does not check his blood sugar at home.   Denies polyuria/polydipsia.  Denies hypo/ hyperglycemia symptoms  Last diabetic eye exam was No results found for: HMDIABEYEEXA  Foot exam- UTD  Last A1C in the office was:  Lab Results  Component Value Date   HGBA1C 8.7 (A) 07/04/2018   HGBA1C 5.5 12/17/2017    Lab Results  Component Value Date   LDLCALC 109 (H) 07/04/2018   CREATININE 0.85 07/04/2018    Wt Readings from Last 3 Encounters:  07/04/18 (!) 370 lb 11.2 oz (168.1 kg)  12/17/17 (!) 356 lb (161.5 kg)  11/10/17 (!) 350 lb (158.8 kg)    BP Readings from Last 3 Encounters:  07/04/18 139/88  12/17/17 126/85  11/10/17 (!) 154/111    1. HTN HPI:  - Patient does not report whether or not he is checking his blood pressure regularly at home.  - Patient reports good compliance with blood pressure medications. Continues on metoprolol and  lisinopril.  - Denies medication S-E   - Smoking Status noted   - He denies new onset of: chest pain, exercise intolerance, shortness of breath, dizziness, visual changes, headache, lower extremity swelling or claudication.   Last 3 blood pressure readings in our office are as follows: BP Readings from Last 3 Encounters:  07/04/18 139/88  12/17/17 126/85  11/10/17 (!) 154/111    Filed Weights   07/04/18 0902  Weight: (!) 370 lb 11.2 oz (168.1 kg)     2. 53 y.o. male here for cholesterol follow-up.   - Patient reports good compliance with medications or treatment plan.  Continues on pravastatin as prescribed.  - Denies medication S-E   - Smoking Status noted   - He denies new onset of: chest pain, exercise intolerance, shortness of breath, dizziness, visual changes, headache, lower extremity swelling or claudication.   Denies myalgias different from usual.  The cholesterol last visit was:  Lab Results  Component Value Date   CHOL 199 07/04/2018   HDL 43 07/04/2018   LDLCALC 109 (H) 07/04/2018   TRIG 236 (H) 07/04/2018   CHOLHDL 4.6 07/04/2018    Hepatic Function Latest Ref Rng & Units 07/04/2018 12/15/2017  Total Protein 6.0 - 8.5 g/dL 7.8 7.8  Albumin 3.8 - 4.9 g/dL 4.6 4.7  AST 0 - 40 IU/L 78(H) 34  ALT 0 - 44 IU/L 93(H) 34  Alk Phosphatase 39 - 117 IU/L 86 69  Total Bilirubin 0.0 - 1.2 mg/dL 0.4 0.8      Wt Readings from Last 3 Encounters:  07/04/18 (!) 370 lb 11.2 oz (168.1 kg)  12/17/17 (!) 356 lb (161.5 kg)  11/10/17 (!) 350 lb (158.8 kg)   BP Readings from Last 3 Encounters:  07/04/18 139/88  12/17/17 126/85  11/10/17 (!) 154/111   Pulse Readings from Last 3 Encounters:  07/04/18 84  12/17/17 85  11/10/17 83   BMI Readings from Last 3 Encounters:  07/04/18 48.91 kg/m  12/17/17 46.97 kg/m  11/10/17 46.18 kg/m     Patient Care Team    Relationship Specialty Notifications Start End  Mellody Dance, DO PCP - General Family Medicine   11/10/17      Patient Active Problem List   Diagnosis Date Noted  . Vitamin D deficiency 12/17/2017  . Diabetes mellitus ([place]) 11/10/2017  . Hypertension associated with diabetes ([place]) 11/10/2017  . Mixed diabetic hyperlipidemia associated with type 2  diabetes mellitus ([place]) 11/10/2017  . Morbid obesity ([place])-  BMI 46.2 11/10/2017  . Noncompliance 11/10/2017    Past Medical history, Surgical history, Family history, Social history, Allergies and Medications have been entered into the medical record, reviewed and changed as needed.    Current Meds  Medication Sig  . lisinopril (PRINIVIL,ZESTRIL) 20 MG tablet Take 1 tablet (20 mg total) by mouth daily.  . metoprolol succinate (TOPROL-XL) 100 MG 24 hr tablet Take 1 tablet (100 mg total) by mouth daily.  . naproxen sodium (ALEVE) 220 MG tablet Take 220 mg by mouth daily as needed.  . Vitamin D, Ergocalciferol, (DRISDOL) 50000 units CAPS capsule 1 tab wed and Sun wkly  . [DISCONTINUED] linaGLIPtin-metFORMIN HCl (JENTADUETO) 2.08-998 MG TABS Take 1 tablet by mouth daily.  . [DISCONTINUED] linaGLIPtin-metFORMIN HCl (JENTADUETO) 2.08-998 MG TABS Take 1 tablet by mouth daily.  . [DISCONTINUED] lisinopril (PRINIVIL,ZESTRIL) 20 MG tablet Take 1 tablet by mouth daily.  . [DISCONTINUED] metoprolol succinate (TOPROL-XL) 100 MG 24 hr tablet Take 1 tablet by mouth daily.  . [DISCONTINUED] pravastatin (PRAVACHOL) 40 MG tablet Take 1 tablet by mouth daily.  . [DISCONTINUED] pravastatin (PRAVACHOL) 40 MG tablet Take 1 tablet (40 mg total) by mouth daily.    Allergies:  Allergies  Allergen Reactions  . Keflet [Cephalexin] Rash  . Penicillins Rash  . Sulfa Antibiotics Rash     Review of Systems:  A fourteen system review of systems was performed and found to be positive as per HPI.   Objective:   Blood pressure 139/88, pulse 84, temperature 98.2 F (36.8 C), height '6\' 1"'  (1.854 m), weight (!) 370 lb 11.2 oz (168.1 kg), SpO2 95 %. Body mass  index is 48.91 kg/m. General:  Well Developed, well nourished, appropriate for stated age.  Neuro:  Alert and oriented,  extra-ocular muscles intact  HEENT:  Normocephalic, atraumatic, neck supple, no carotid bruits appreciated  Skin:  no gross rash, warm, pink. Cardiac:  RRR, S1 S2 Respiratory:  ECTA B/L and A/P, Not using accessory muscles, speaking in full sentences- unlabored. Vascular:  Ext warm, no cyanosis apprec.; cap RF less 2 sec. Psych:  No HI/SI, judgement and insight good, Euthymic mood. Full Affect.

## 2018-07-05 ENCOUNTER — Other Ambulatory Visit: Payer: Self-pay | Admitting: Family Medicine

## 2018-07-05 DIAGNOSIS — Z9119 Patient's noncompliance with other medical treatment and regimen: Secondary | ICD-10-CM

## 2018-07-05 DIAGNOSIS — Z91199 Patient's noncompliance with other medical treatment and regimen due to unspecified reason: Secondary | ICD-10-CM

## 2018-07-05 DIAGNOSIS — E559 Vitamin D deficiency, unspecified: Secondary | ICD-10-CM

## 2018-07-05 DIAGNOSIS — E1159 Type 2 diabetes mellitus with other circulatory complications: Secondary | ICD-10-CM

## 2018-07-05 DIAGNOSIS — E1169 Type 2 diabetes mellitus with other specified complication: Secondary | ICD-10-CM

## 2018-07-05 DIAGNOSIS — I10 Essential (primary) hypertension: Secondary | ICD-10-CM

## 2018-07-05 DIAGNOSIS — I152 Hypertension secondary to endocrine disorders: Secondary | ICD-10-CM

## 2018-07-05 DIAGNOSIS — E782 Mixed hyperlipidemia: Secondary | ICD-10-CM

## 2018-07-05 LAB — CBC WITH DIFFERENTIAL/PLATELET
BASOS ABS: 0.1 10*3/uL (ref 0.0–0.2)
Basos: 1 %
EOS (ABSOLUTE): 0.2 10*3/uL (ref 0.0–0.4)
Eos: 3 %
Hematocrit: 45.2 % (ref 37.5–51.0)
Hemoglobin: 15.6 g/dL (ref 13.0–17.7)
IMMATURE GRANULOCYTES: 0 %
Immature Grans (Abs): 0 10*3/uL (ref 0.0–0.1)
Lymphocytes Absolute: 1.8 10*3/uL (ref 0.7–3.1)
Lymphs: 28 %
MCH: 29.9 pg (ref 26.6–33.0)
MCHC: 34.5 g/dL (ref 31.5–35.7)
MCV: 87 fL (ref 79–97)
Monocytes Absolute: 0.5 10*3/uL (ref 0.1–0.9)
Monocytes: 8 %
NEUTROS PCT: 60 %
Neutrophils Absolute: 3.9 10*3/uL (ref 1.4–7.0)
Platelets: 215 10*3/uL (ref 150–450)
RBC: 5.21 x10E6/uL (ref 4.14–5.80)
RDW: 13 % (ref 11.6–15.4)
WBC: 6.5 10*3/uL (ref 3.4–10.8)

## 2018-07-05 LAB — MICROALBUMIN / CREATININE URINE RATIO
Creatinine, Urine: 180.2 mg/dL
MICROALB/CREAT RATIO: 232 mg/g{creat} — AB (ref 0–29)
Microalbumin, Urine: 418.1 ug/mL

## 2018-07-05 LAB — COMPREHENSIVE METABOLIC PANEL
ALBUMIN: 4.6 g/dL (ref 3.8–4.9)
ALT: 93 IU/L — ABNORMAL HIGH (ref 0–44)
AST: 78 IU/L — ABNORMAL HIGH (ref 0–40)
Albumin/Globulin Ratio: 1.4 (ref 1.2–2.2)
Alkaline Phosphatase: 86 IU/L (ref 39–117)
BUN/Creatinine Ratio: 19 (ref 9–20)
BUN: 16 mg/dL (ref 6–24)
Bilirubin Total: 0.4 mg/dL (ref 0.0–1.2)
CO2: 22 mmol/L (ref 20–29)
Calcium: 10 mg/dL (ref 8.7–10.2)
Chloride: 98 mmol/L (ref 96–106)
Creatinine, Ser: 0.85 mg/dL (ref 0.76–1.27)
GFR calc Af Amer: 116 mL/min/{1.73_m2} (ref >59)
GFR calc non Af Amer: 100 mL/min/{1.73_m2} (ref >59)
GLOBULIN, TOTAL: 3.2 g/dL (ref 1.5–4.5)
Glucose: 245 mg/dL — ABNORMAL HIGH (ref 65–99)
Potassium: 4.8 mmol/L (ref 3.5–5.2)
Sodium: 137 mmol/L (ref 134–144)
Total Protein: 7.8 g/dL (ref 6.0–8.5)

## 2018-07-05 LAB — LIPID PANEL
Chol/HDL Ratio: 4.6 ratio (ref 0.0–5.0)
Cholesterol, Total: 199 mg/dL (ref 100–199)
HDL: 43 mg/dL (ref >39)
LDL CALC: 109 mg/dL — AB (ref 0–99)
Triglycerides: 236 mg/dL — ABNORMAL HIGH (ref 0–149)
VLDL Cholesterol Cal: 47 mg/dL — ABNORMAL HIGH (ref 5–40)

## 2018-07-05 LAB — T4, FREE: Free T4: 1.1 ng/dL (ref 0.82–1.77)

## 2018-07-05 LAB — VITAMIN D 25 HYDROXY (VIT D DEFICIENCY, FRACTURES): Vit D, 25-Hydroxy: 30.3 ng/mL (ref 30.0–100.0)

## 2018-07-05 LAB — TSH: TSH: 3.04 u[IU]/mL (ref 0.450–4.500)

## 2018-07-05 MED ORDER — LINAGLIPTIN-METFORMIN HCL 2.5-1000 MG PO TABS: 1.0000 | ORAL_TABLET | Freq: Two times a day (BID) | ORAL | 0 refills | Status: DC

## 2018-07-05 MED ORDER — PRAVASTATIN SODIUM 40 MG PO TABS: 20.0000 mg | ORAL_TABLET | Freq: Every day | ORAL | 1 refills | Status: DC

## 2018-07-05 NOTE — Progress Notes (Signed)
Please call patient and ask him about the exact dose of Trulicity he was on prior.    I would like him to restart that medicine at 1.5 mg subcu every week.   Only go to the 1.5 if he was taking the 0.75mg  weekly prior.    Please send just a 90-day supply and no refills to patient's pharmacy of choice.  He will need to increase his Jentadueto dose to 1 tablet twice daily as well.  -Tell him he will need a chronic diabetes follow-up with me in 6 weeks instead of waiting 3 months.   If he already has an upcoming complete physical exam appointment, he will need this appointment in addition to the physical

## 2018-07-06 NOTE — Progress Notes (Signed)
Patient notified, will call with dose in the morning. MPulliam, CMA/RT(R)

## 2018-07-07 NOTE — Progress Notes (Signed)
Called patient to follow up left message to call the office. MPulliam, CMA/RT(R)

## 2018-07-18 NOTE — Progress Notes (Signed)
Called patient and he states that he wants to stay on just the [place] until his next visit and see if A1C has improved.  Patient states that at [facility] he was not taking DM medications at the time.  Patient states that he is feeling much better and is not urinating as much during the night.  Please review and advise if okay for the patient to hold off on restarting the Trulicity at this time and/or if you would like the patient to come in sooner for follow up appt is for 08/18/2018.  MPulliam, CMA/RT(R)

## 2018-07-19 NOTE — Progress Notes (Signed)
Patient wants to do that then I cannot convince him otherwise.  Please make sure he is checking his blood sugars and keeping a log.  Please let me know what they have been running at home.  If they are not near goal then he should be restarting the Trulicity.

## 2018-07-19 NOTE — Progress Notes (Signed)
Called left message to call the office back. MPulliam, CMA/RT(R)

## 2018-07-20 NOTE — Progress Notes (Signed)
Spoke to patient and he states that he is logging his levels daily and that they are in good range.  Patient will bring log to the office Friday morning so they can be added to his chart for review.  Notified Dr. Raliegh Scarlet of this. MPulliam, CMA/RT(R)

## 2018-08-18 ENCOUNTER — Encounter: Payer: Self-pay | Admitting: Family Medicine

## 2018-08-18 ENCOUNTER — Ambulatory Visit (INDEPENDENT_AMBULATORY_CARE_PROVIDER_SITE_OTHER): Payer: 59 | Admitting: Family Medicine

## 2018-08-18 ENCOUNTER — Other Ambulatory Visit: Payer: Self-pay

## 2018-08-18 VITALS — BP 128/87 | HR 77 | Temp 97.1°F | Ht 73.0 in | Wt 361.1 lb

## 2018-08-18 DIAGNOSIS — E1159 Type 2 diabetes mellitus with other circulatory complications: Secondary | ICD-10-CM | POA: Diagnosis not present

## 2018-08-18 DIAGNOSIS — Z91199 Patient's noncompliance with other medical treatment and regimen due to unspecified reason: Secondary | ICD-10-CM

## 2018-08-18 DIAGNOSIS — R809 Proteinuria, unspecified: Secondary | ICD-10-CM

## 2018-08-18 DIAGNOSIS — Z87891 Personal history of nicotine dependence: Secondary | ICD-10-CM

## 2018-08-18 DIAGNOSIS — E1169 Type 2 diabetes mellitus with other specified complication: Secondary | ICD-10-CM | POA: Diagnosis not present

## 2018-08-18 DIAGNOSIS — I10 Essential (primary) hypertension: Secondary | ICD-10-CM

## 2018-08-18 DIAGNOSIS — E1129 Type 2 diabetes mellitus with other diabetic kidney complication: Secondary | ICD-10-CM

## 2018-08-18 DIAGNOSIS — E559 Vitamin D deficiency, unspecified: Secondary | ICD-10-CM

## 2018-08-18 DIAGNOSIS — Z9119 Patient's noncompliance with other medical treatment and regimen: Secondary | ICD-10-CM

## 2018-08-18 DIAGNOSIS — I152 Hypertension secondary to endocrine disorders: Secondary | ICD-10-CM

## 2018-08-18 DIAGNOSIS — E782 Mixed hyperlipidemia: Secondary | ICD-10-CM

## 2018-08-18 NOTE — Progress Notes (Signed)
Virtual Visit via Telephone-  Note for [employer], D.O- Primary Care Physician at [facility]   I connected with current patient today by telephone/ video and verified that I am speaking with the correct person using two identifiers.    This visit type was conducted due to national recommendations for restrictions regarding the COVID-19 Pandemic (e.g. social distancing) in an effort to limit this patient's exposure and mitigate transmission in our community.  Due to her co-morbid illnesses, this patient is at least at moderate risk for complications without adequate follow up.  This format is felt to be most appropriate for this patient at this time.  The patient did not have access to video technology/had technical difficulties with video requiring transitioning to audio format only (telephone).  No physical exam could be performed with this format, beyond that communicated to [place] by the patient/ family members as noted.  Additionally my staff members also discussed with the patient that there may be a patient charge related to this service.   The patient expressed understanding, and agreed to proceed.      History of Present Illness:    HPI:  Tina Tem[age] y.o. male presents for routine chronic follow up for multiple medical problems.   1)  DM HPI:  -  He has not been working on diet and exercise for diabetes.  He has been noncompliant with medications   Patient was told to increase his Jentadueto to twice daily dosing instead of once daily and patient has not done so.  Patient still only taking it once daily.   he was given script stating this change on it on 07/05/18.     Also patient was told to go on Trulicity however he did not-he told me he had a prescription from previously.  In the notes he was given this on 8 of 2019 so less than a year ago.     He stated he was "previously on 75 mg daily" per patient     his sugar this morning is 297, and they continue to be  poorly controlled.  Denies polyuria/polydipsia.  Denies hypo/ hyperglycemia symptoms  Last diabetic eye exam was No results found for: HMDIABEYEEXA  Lab Results  Component Value Date   HGBA1C 8.7 (A) 07/04/2018   HGBA1C 5.5 12/17/2017    Lab Results  Component Value Date   LDLCALC 109 (H) 07/04/2018   CREATININE 0.85 07/04/2018    2) Hyperlipidemia  Cholesterol: Patient was told ( on 07/06/18) to take a half tab of a statin due to recent labs and ALT being at 93 but however, he has not cut his does in half.   Pt reports compliance with meds  Denies Medication S-E  (RUQ pain; Muscle aches )  Last lipid panel as follows:  Lab Results  Component Value Date   CHOL 199 07/04/2018   HDL 43 07/04/2018   LDLCALC 109 (H) 07/04/2018   TRIG 236 (H) 07/04/2018   CHOLHDL 4.6 07/04/2018    Lab Results  Component Value Date   ALT 93 (H) 07/04/2018      3) Hypertension  Pt reports compliance with medications Denies medication S-E. Home Blood pressure range-  "around where it is today"  Low salt diet-    no  Exercise-    no Smoking Status noted  Patient denies new onset of sx- no chest pain, dizziness, HA, DIB/ shortness of breath or swelling.  Lab Results  Component Value Date  CREATININE 0.85 07/04/2018    Last 3 blood pressure readings in our office are as follows: BP Readings from Last 3 Encounters:  08/25/18 127/85  08/18/18 128/87  07/04/18 139/88    Wt Readings from Last 3 Encounters:  08/25/18 (!) 362 lb (164.2 kg)  08/18/18 (!) 361 lb 1.6 oz (163.8 kg)  07/04/18 (!) 370 lb 11.2 oz (168.1 kg)    Pulse Readings from Last 3 Encounters:  08/25/18 86  08/18/18 77  07/04/18 84    BMI Readings from Last 3 Encounters:  08/25/18 49.10 kg/m  08/18/18 47.64 kg/m  07/04/18 48.91 kg/m    History of patient's medications when he first established with me on 11/10/2017  Medication Sig  . linaGLIPtin-metFORMIN HCl (JENTADUETO) 2.08-998 MG TABS Take 1  tablet by mouth daily.  Marland Kitchen lisinopril (PRINIVIL,ZESTRIL) 20 MG tablet Take 1 tablet by mouth daily.  . metoprolol succinate (TOPROL-XL) 100 MG 24 hr tablet Take 1 tablet by mouth daily.  . pravastatin (PRAVACHOL) 40 MG tablet Take 1 tablet by mouth daily.  . TRULICITY 1.74 BS/4.9QP SOPN Inject 0.5 mLs into the skin once a week      -Vitals obtained; Medications, allergies reconciled;  personal medical, social, Sx etc. etc. histories were updated by Lanier Prude the medical assistant today and are reflected in below chart   Patient Care Team    Relationship Specialty Notifications Start End  Mellody Dance, DO PCP - General Family Medicine  11/10/17      Patient Active Problem List   Diagnosis Date Noted  . Microalbuminuria due to type 2 diabetes mellitus ([place]) 09/10/2018    Priority: High  . Diabetes mellitus ([place]) 11/10/2017    Priority: High  . Hypertension associated with diabetes ([place]) 11/10/2017    Priority: High  . Mixed diabetic hyperlipidemia associated with type 2 diabetes mellitus ([place]) 11/10/2017    Priority: High  . Morbid obesity ([place])-  BMI 46.2 11/10/2017    Priority: High  . History of smoking - 75-pack-year history; quit 2007 09/10/2018    Priority: Medium    Class: Health Concern  . Noncompliance 11/10/2017    Priority: Medium  . Family history of secondary lung cancer- father late 21s, heavy smoker 09/10/2018    Priority: Low  . Vitamin D deficiency 12/17/2017    Priority: Low     Current Meds  Medication Sig  . linaGLIPtin-metFORMIN HCl (JENTADUETO) 2.08-998 MG TABS Take 1 tablet by mouth 2 (two) times daily. (Patient taking differently: Take 1 tablet by mouth 2 (two) times a day. )  . lisinopril (PRINIVIL,ZESTRIL) 20 MG tablet Take 1 tablet (20 mg total) by mouth daily.  . metoprolol succinate (TOPROL-XL) 100 MG 24 hr tablet Take 1 tablet (100 mg total) by mouth daily.  . pravastatin (PRAVACHOL) 40 MG tablet Take 0.5 tablets (20 mg total) by  mouth at bedtime.  . Vitamin D, Ergocalciferol, (DRISDOL) 50000 units CAPS capsule 1 tab wed and Sun wkly     Allergies:  Allergies  Allergen Reactions  . Keflet [Cephalexin] Rash  . Penicillins Rash  . Sulfa Antibiotics Rash     ROS:  See above HPI for pertinent positives and negatives   Objective:   Blood pressure 128/87, pulse 77, temperature (!) 97.1 F (36.2 C), height 6\' 1"  (1.854 m), weight (!) 361 lb 1.6 oz (163.8 kg). (if some vitals are omitted, this means that patient was UNABLE to obtain them even though asked to get them prior to [place] today) General:  sounds in no acute distress.  Skin: Pt confirms warm and dry  extremities and pink fingertips Respiratory: speaking in full sentences, no conversational dyspnea Psych: A and O *3, appears insight good, mood- full      Impression and Recommendations:      ICD-10-CM   1. Type 2 diabetes mellitus with other specified complication, without long-term current use of insulin (HCC) E11.69 Dulaglutide (TRULICITY) 8.58 IF/0.2DX SOPN  2. Microalbuminuria due to type 2 diabetes mellitus ([place]) E11.29    R80.9    Around 230 in March 2020  3. Hypertension associated with diabetes ([place]) E11.59    I10   4. Mixed diabetic hyperlipidemia associated with type 2 diabetes mellitus (HCC) E11.69    E78.2   5. Morbid obesity ([place])-  BMI 46.2 E66.01   6. Noncompliance Z91.19   7. History of smoking 30 or more pack years:   Actually 75-pack-year history quit 2007 Z87.891     -Diabetes mellitus strongly advised patient to take all medications as written and to monitor his fasting as well as 2-hour postprandial blood sugars.  Since he is just restarting medicines today and/or taking them as prescribed I recommend a recheck of A1c along with OV, 3 months from today's office visit.  I told patient specifically to call back and make this appointment -Advised patient importance of diabetic eye exams yearly.  He told me he would make the  appointment. -I explained what proteinuria means and that his is over 230 and normal is less than 30.  We need to check this yearly- again in March 2021 -Pathophysiology of disease reviewed with patient.  Explained how he is at tremendously increased risk for cardiovascular events with poorly controlled blood sugars etc.  He voiced understanding  -Patient to continue his ACE inhibitor.  Also on metoprolol.  Recommended to continue to monitor blood pressures at home.  Recommended weight loss, low-salt diet and regular exercise.  Patient tells me he does not need to do exercise beyond his physical activity that he does at work every day  -For cholesterol, patient was told to take half a tablet of his 40 mg pravastatin.  He has been taking 40 mg.  Likely due to poor diet and likely fatty liver infiltrate, ALT has now gone up.  Explained the importance of cutting this cholesterol med in half and we will recheck ALT in 3 months at ov  Risks associated with noncompliant behavior with my treatment regimen or treatment plan reviewed with patient.  -Pt denies depression/ mood disorder, denies significant barriers of education, money etc. -Explained many significant risks associated with these disease processes that if they remain poorly controlled, can even lead to death. - sincerely advised pt that if he is going to continue to not follow my advice/ direction, it is best he stop coming to our clinic as to not waste their time/ money.   -Patient declines diabetic nutritionist consult or weight management referral -Explained to patient what BMI refers to, and what it means medically.    Told patient to think about it as a "medical risk stratification measurement" and how increasing BMI is associated with increasing risk/ or worsening state of various diseases such as hypertension, hyperlipidemia, diabetes, premature OA, depression etc. -American Heart Association guidelines for healthy diet, basically  Mediterranean diet, and exercise guidelines of 30 minutes 5 days per week or more discussed in detail. - EXTENSIVE Health counseling performed.  All questions answered.  As part of my medical decision making,  I reviewed the following data within the [place] obtained from pt/family, CMA notes reviewed and incorporated, Labs reviewed, Radiograph/ tests reviewed if applicable and OV notes from prior OV's with me, as well as other specialists he has seen since seeing me last, were all reviewed and used in my medical decision making process today. Additionally, discussion had with patient regarding txmnt plan, their biases about that plan etc were used in my medical decision making today.  I discussed the assessment and treatment plan with the patient. The patient was provided an opportunity to ask questions and all were answered.  The patient agreed with the plan and demonstrated an understanding of the instructions.   No barriers to understanding were identified.  Red flag symptoms and signs discussed in detail.  Patient expressed understanding regarding what to do in case of emergency\urgent symptoms   The patient was advised to call back or seek an in-person evaluation if the symptoms worsen or if the condition fails to improve as anticipated.   Return for End of July for ALT.    No orders of the defined types were placed in this encounter.   Meds ordered this encounter  Medications  . Dulaglutide (TRULICITY) 7.89 FY/1.0FB SOPN    Sig: Inject 0.5 mLs into the skin once a week.    Dispense:  4 pen    Refill:  1    Medications Discontinued During This Encounter  Medication Reason  . naproxen sodium (ALEVE) 220 MG tablet No longer needed (for PRN medications)      *Gross side effects, risk and benefits, and alternatives of medications and treatment plan in general discussed with patient.  Patient is aware that all medications have potential side effects and we  are unable to predict every side effect or drug-drug interaction that may occur.   Patient was strongly encouraged to call with any questions or concerns they may have concerns.     I provided 26 minutes of non-face-to-face time during this encounter,with over 50% of the time in direct counseling on patients medical conditions/ concerns.  Additional time was spent with charting and coordination of care after the actual visit commenced.    Mellody Dance, DO

## 2018-08-24 ENCOUNTER — Telehealth: Payer: Self-pay

## 2018-08-24 NOTE — Telephone Encounter (Signed)
Patient called and states that he though that Trulicity was being sent in at his last office visit on 08/18/18.  I did not see anything in the chart please review and advise. MPulliam, CMA/RT(R)

## 2018-08-25 ENCOUNTER — Other Ambulatory Visit: Payer: Self-pay

## 2018-08-25 ENCOUNTER — Ambulatory Visit (INDEPENDENT_AMBULATORY_CARE_PROVIDER_SITE_OTHER): Payer: 59 | Admitting: Family Medicine

## 2018-08-25 VITALS — BP 127/85 | HR 86 | Temp 97.9°F | Ht 72.0 in | Wt 362.0 lb

## 2018-08-25 DIAGNOSIS — J3089 Other allergic rhinitis: Secondary | ICD-10-CM | POA: Diagnosis not present

## 2018-08-25 DIAGNOSIS — R05 Cough: Secondary | ICD-10-CM

## 2018-08-25 DIAGNOSIS — R058 Other specified cough: Secondary | ICD-10-CM

## 2018-08-25 DIAGNOSIS — E1169 Type 2 diabetes mellitus with other specified complication: Secondary | ICD-10-CM | POA: Diagnosis not present

## 2018-08-25 MED ORDER — DULAGLUTIDE 0.75 MG/0.5ML ~~LOC~~ SOAJ: 0.5000 mL | SUBCUTANEOUS | 1 refills | Status: DC

## 2018-08-25 MED ORDER — FEXOFENADINE HCL 180 MG PO TABS: 180.0000 mg | ORAL_TABLET | Freq: Every day | ORAL | 3 refills | Status: DC

## 2018-08-25 NOTE — Progress Notes (Signed)
Telehealth office visit note for Ricardo Wolf, D.O- at Primary Care at [facility]   I connected with current patient today and verified that I am speaking with the correct person using two identifiers.   . Location of the patient: Home . Location of the provider: Office Only the patient (+/- their family members at pt's discretion) and myself were participating in the encounter    - This visit type was conducted due to national recommendations for restrictions regarding the COVID-19 Pandemic (e.g. social distancing) in an effort to limit this patient's exposure and mitigate transmission in our community.  This format is felt to be most appropriate for this patient at this time.   - The patient did not have access to video technology or had technical difficulties with video requiring transitioning to audio format only. - No physical exam could be performed with this format, beyond that communicated to [place] by the patient/ family members as noted.   - Additionally my office staff/ schedulers discussed with the patient that there may be a monetary charge related to this service, depending on their medical insurance.   The patient expressed understanding, and agreed to proceed.       History of Present Illness:  1.5 d ago- mowed large field/ yard and did yard work all day for work.  Then had nasal congestion, eyes watery/ scratchy few hours later.   Per pt he has always had seasonal allergies, .   Then that night- felt congested, had dry cough.  Pt was afraid for covid and he stayed home from work.  Temp 97.9.  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 Environmental and seasonal allergies   Environmental and seasonal allergies /  PND Cough - Plan: fexofenadine (ALLEGRA) 180 MG tablet --Allegra -Flonase after sinus rinses twice daily if needed, if Allegra and sinus rinses alone do not work. -Explained to patient he needs to stop exposure and most importantly, if he cannot stop exposure by using a mask during outdoor work then, come right and and flush nasal passages and sinuses   Type 2 diabetes mellitus with other specified complication, without long-term current use of insulin ([place]) -Patient needs refill on his test strips and diabetic supplies-CMA to refill. -Also he will start Trulicity ( we started last week when seen 1 week ago)   - He knows to follow-up 6 weeks after he starts that medicine.  This would be mid June.   - As part of my medical decision making, I reviewed the following data within the [place] History obtained from pt /family, CMA notes reviewed and incorporated if applicable, Labs reviewed, Radiograph/ tests reviewed if applicable and OV notes from prior OV's with me, as well as other specialists she/he has seen since seeing me last, were all reviewed and used in my medical decision making process today.   - Additionally, discussion had with patient regarding txmnt plan, and their biases/concerns about that plan were used in my medical decision making today.   - The patient agreed with the plan and demonstrated an understanding of the instructions.   No barriers to understanding were identified.   - Red flag symptoms and signs  discussed in detail.  Patient expressed understanding regarding what to do in case of emergency\ urgent symptoms.  The patient was advised to call back or seek an in-person evaluation if the symptoms worsen or if the condition fails to improve as anticipated.   Return for Keep existing follow-up mid June-for diabetes, started Trulicity.    No orders of the defined types were placed in this encounter.   Meds ordered this encounter  Medications  .  fexofenadine (ALLEGRA) 180 MG tablet    Sig: Take 1 tablet (180 mg total) by mouth daily. Just during allergy season    Dispense:  90 tablet    Refill:  3    There are no discontinued medications.    I provided 13 minutes of non-face-to-face time during this encounter,with over 50% of the time in direct counseling on patients medical conditions/ medical concerns.  Additional time was spent with charting and coordination of care after the actual visit commenced.   Note:  This note was prepared with assistance of Dragon voice recognition software. Occasional wrong-word or sound-a-like substitutions may have occurred due to the inherent limitations of voice recognition software.  Ricardo Dance, DO    -Vitals obtained; medications/ allergies reconciled;  personal medical, social, Sx etc.histories were updated by CMA, reviewed by me and are reflected in chart   Patient Care Team    Relationship Specialty Notifications Start End  Ricardo Dance, DO PCP - General Family Medicine  11/10/17      Patient Active Problem List   Diagnosis Date Noted  . Vitamin D deficiency 12/17/2017  . Diabetes mellitus ([place]) 11/10/2017  . Hypertension associated with diabetes ([place]) 11/10/2017  . Mixed diabetic hyperlipidemia associated with type 2 diabetes mellitus ([place]) 11/10/2017  . Morbid obesity ([place])-  BMI 46.2 11/10/2017  . Noncompliance 11/10/2017     Current Meds  Medication Sig  . linaGLIPtin-metFORMIN HCl (JENTADUETO) 2.08-998 MG TABS Take 1 tablet by mouth 2 (two) times daily. (Patient taking differently: Take 1 tablet by mouth 2 (two) times a day. )  . lisinopril (PRINIVIL,ZESTRIL) 20 MG tablet Take 1 tablet (20 mg total) by mouth daily.  . metoprolol succinate (TOPROL-XL) 100 MG 24 hr tablet Take 1 tablet (100 mg total) by mouth daily.  . pravastatin (PRAVACHOL) 40 MG tablet Take 0.5 tablets (20 mg total) by mouth at bedtime.  . Vitamin D, Ergocalciferol, (DRISDOL) 50000 units CAPS  capsule 1 tab wed and Sun wkly     Allergies:  Allergies  Allergen Reactions  . Keflet [Cephalexin] Rash  . Penicillins Rash  . Sulfa Antibiotics Rash     ROS:  See above HPI for pertinent positives and negatives   Objective:   Blood pressure 127/85, pulse 86, temperature 97.9 F (36.6 C), height 6' (1.829 m), weight (!) 362 lb (164.2 kg).  (if some vitals are omitted, this means that patient was UNABLE to obtain them even though they were asked to get them prior to OV today.  They were asked to call us at their earliest convenience with these once obtained. )  General: A & O * 3; sounds in no acute distress; in usual state of health.  Skin: Pt confirms warm and dry extremities and pink fingertips HEENT: Pt confirms lips non-cyanotic Chest: Patient confirms normal chest excursion and movement Respiratory: speaking in full sentences, no conversational dyspnea; patient confirms no use of accessory muscles Psych: insight appears good, mood- appears full

## 2018-08-25 NOTE — Telephone Encounter (Signed)
thnx for sending this in- sorry it was just pending and not signed off by me

## 2018-09-10 DIAGNOSIS — E1129 Type 2 diabetes mellitus with other diabetic kidney complication: Secondary | ICD-10-CM | POA: Insufficient documentation

## 2018-09-10 DIAGNOSIS — Z801 Family history of malignant neoplasm of trachea, bronchus and lung: Secondary | ICD-10-CM | POA: Insufficient documentation

## 2018-09-10 DIAGNOSIS — Z87891 Personal history of nicotine dependence: Secondary | ICD-10-CM

## 2018-09-10 HISTORY — DX: Family history of malignant neoplasm of trachea, bronchus and lung: Z80.1

## 2018-09-10 HISTORY — DX: Personal history of nicotine dependence: Z87.891

## 2018-10-11 ENCOUNTER — Other Ambulatory Visit: Payer: Self-pay

## 2018-10-11 ENCOUNTER — Ambulatory Visit: Payer: 59 | Admitting: Family Medicine

## 2018-10-19 ENCOUNTER — Other Ambulatory Visit: Payer: Self-pay | Admitting: Family Medicine

## 2018-10-19 DIAGNOSIS — E1169 Type 2 diabetes mellitus with other specified complication: Secondary | ICD-10-CM

## 2018-11-14 ENCOUNTER — Other Ambulatory Visit: Payer: Self-pay | Admitting: Family Medicine

## 2018-11-14 DIAGNOSIS — E1169 Type 2 diabetes mellitus with other specified complication: Secondary | ICD-10-CM

## 2018-11-17 ENCOUNTER — Encounter: Payer: Self-pay | Admitting: Family Medicine

## 2018-11-17 ENCOUNTER — Other Ambulatory Visit: Payer: Self-pay

## 2018-11-17 ENCOUNTER — Telehealth: Payer: Self-pay | Admitting: Family Medicine

## 2018-11-17 ENCOUNTER — Ambulatory Visit (INDEPENDENT_AMBULATORY_CARE_PROVIDER_SITE_OTHER): Payer: 59 | Admitting: Adult Health

## 2018-11-17 ENCOUNTER — Encounter: Payer: Self-pay | Admitting: Adult Health

## 2018-11-17 DIAGNOSIS — K047 Periapical abscess without sinus: Secondary | ICD-10-CM | POA: Diagnosis not present

## 2018-11-17 HISTORY — DX: Periapical abscess without sinus: K04.7

## 2018-11-17 MED ORDER — CLINDAMYCIN HCL 300 MG PO CAPS: 300.0000 mg | ORAL_CAPSULE | Freq: Three times a day (TID) | ORAL | 0 refills | Status: DC

## 2018-11-17 MED ORDER — OXYCODONE-ACETAMINOPHEN 7.5-325 MG PO TABS: 1.0000 | ORAL_TABLET | Freq: Three times a day (TID) | ORAL | 0 refills | Status: AC | PRN

## 2018-11-17 NOTE — Progress Notes (Signed)
Virtual Visit via Telephone Note  I connected with Ricardo Wolf on 11/17/18 at 10:45 AM EDT by telephone and verified that I am speaking with the correct person using two identifiers.  Location: Patient Home Provider: In Clinic   I discussed the limitations, risks, security and privacy concerns of performing an evaluation and management service by telephone and the availability of in person appointments. I also discussed with the patient that there may be a patient responsible charge related to this service. The patient expressed understanding and agreed to proceed.   History of Present Illness: Ms. Ricardo Wolf calls in with tooth pain originating from broken bottom left tooth, has been broken >12 months. He was evaluated by a local dentist Dec 2019, it was not causing pain at time, therefore not repaired. He is unable to secure an appt unitl 12/2018- he was placed on the "cancellation list" He reports dramatic increase in pain in the last 48 hrs. Pain is constant, described as throbbing rated 8/10 He is unable to masticate food He denies drainage He denies fever/N/V/D He reports swelling, redness, and tenderness around tooth He reports that his L check is swolle He has been using OTC Aleve and Naprosyn- last Aleve dose was 2 hrs ago He reports only minimal sx relief.  Patient Care Team    Relationship Specialty Notifications Start End  Mellody Dance, DO PCP - General Family Medicine  11/10/17     Patient Active Problem List   Diagnosis Date Noted  . History of smoking - 75-pack-year history; quit 2007 09/10/2018    Class: Health Concern  . Microalbuminuria due to type 2 diabetes mellitus ([place]) 09/10/2018  . Family history of secondary lung cancer- father late 36s, heavy smoker 09/10/2018  . Vitamin D deficiency 12/17/2017  . Diabetes mellitus ([place]) 11/10/2017  . Hypertension associated with diabetes ([place]) 11/10/2017  . Mixed diabetic hyperlipidemia associated with type 2 diabetes  mellitus ([place]) 11/10/2017  . Morbid obesity ([place])-  BMI 46.2 11/10/2017  . Noncompliance 11/10/2017     Past Medical History:  Diagnosis Date  . Diabetes ([place])   . High cholesterol   . Hypertension      Past Surgical History:  Procedure Laterality Date  . BACK SURGERY  2003  . BACK SURGERY  2011     Family History  Problem Relation Age of Onset  . Diabetes Mother   . Lung cancer Father   . Diabetes Sister      Social History   Substance and Sexual Activity  Drug Use Never     Social History   Substance and Sexual Activity  Alcohol Use Yes  . Alcohol/week: 2.0 standard drinks  . Types: 2 Standard drinks or equivalent per week     Social History   Tobacco Use  Smoking Status Former Smoker  . Packs/day: 1.00  . Types: Cigarettes  . Quit date: 2007  . Years since quitting: 13.5  Smokeless Tobacco Never Used     Outpatient Encounter Medications as of 11/17/2018  Medication Sig  . fexofenadine (ALLEGRA) 180 MG tablet Take 1 tablet (180 mg total) by mouth daily. Just during allergy season  . linaGLIPtin-metFORMIN HCl (JENTADUETO) 2.08-998 MG TABS Take 1 tablet by mouth 2 (two) times daily. (Patient taking differently: Take 1 tablet by mouth 2 (two) times a day. )  . lisinopril (PRINIVIL,ZESTRIL) 20 MG tablet Take 1 tablet (20 mg total) by mouth daily.  . metoprolol succinate (TOPROL-XL) 100 MG 24 hr tablet Take 1 tablet (100  mg total) by mouth daily.  . pravastatin (PRAVACHOL) 40 MG tablet Take 0.5 tablets (20 mg total) by mouth at bedtime.  . TRULICITY 9.44 HQ/7.5FF SOPN INJECT 0.5 MLS INTO THE SKIN ONCE A WEEK. NEEDS OFFICE VISIT FOR FURTHER REFILLS  . Vitamin D, Ergocalciferol, (DRISDOL) 50000 units CAPS capsule 1 tab wed and Sun wkly  . clindamycin (CLEOCIN) 300 MG capsule Take 1 capsule (300 mg total) by mouth 3 (three) times daily.  Marland Kitchen oxyCODONE-acetaminophen (PERCOCET) 7.5-325 MG tablet Take 1 tablet by mouth every 8 (eight) hours as needed for up to 3  days for severe pain.   No facility-administered encounter medications on file as of 11/17/2018.     Allergies: Keflet [cephalexin], Penicillins, and Sulfa antibiotics  There is no height or weight on file to calculate BMI.  There were no vitals taken for this visit.  Observations/Objective: Pt sounded muffled during the telephone converstation  Assessment and Plan: FOLLOW-UP WITH DENTIST ASAP Warm salt, water gargles Clindamycin 300mg  TID x 10days Continue OTC NSAIDs for mild- moderated pain [place] Controlled Substance Database reviewed- no aberrancies noted Short opiate ex sent in Follow Pureed Diet Work note provided Continue to social distance and wear a mask when in public  Follow Up Instructions: PRN   I discussed the assessment and treatment plan with the patient. The patient was provided an opportunity to ask questions and all were answered. The patient agreed with the plan and demonstrated an understanding of the instructions.   The patient was advised to call back or seek an in-person evaluation if the symptoms worsen or if the condition fails to improve as anticipated.  I provided 22  minutes of non-face-to-face time during this encounter.   Ricardo Grandchild, NP

## 2018-11-17 NOTE — Assessment & Plan Note (Addendum)
Assessment and Plan: FOLLOW-UP WITH DENTIST ASAP Warm salt, water gargles Clindamycin 300mg  TID x 10days Continue OTC NSAIDs for mild- moderated pain [place] Controlled Substance Database reviewed- no aberrancies noted Short opiate ex sent in Follow Pureed Diet Work note provided Continue to social distance and wear a mask when in public  Follow Up Instructions: PRN   I discussed the assessment and treatment plan with the patient. The patient was provided an opportunity to ask questions and all were answered. The patient agreed with the plan and demonstrated an understanding of the instructions.   The patient was advised to call back or seek an in-person evaluation if the symptoms worsen or if the condition fails to improve as anticipated.

## 2018-11-17 NOTE — Telephone Encounter (Signed)
error 

## 2018-11-18 ENCOUNTER — Other Ambulatory Visit: Payer: Self-pay | Admitting: Family Medicine

## 2018-11-18 DIAGNOSIS — E1169 Type 2 diabetes mellitus with other specified complication: Secondary | ICD-10-CM

## 2018-11-22 ENCOUNTER — Telehealth: Payer: Self-pay | Admitting: Family Medicine

## 2018-11-22 NOTE — Telephone Encounter (Signed)
Pt states that he is having diarrhea 3-4 times daily, nausea, indigestion, and gas "within 3 minutes of taking the medication".  Has tried Imodium AD, Pepto Bismol as well as taking it with food and without food with no relief.     Please advise.  Charyl Bigger, CMA

## 2018-11-22 NOTE — Telephone Encounter (Signed)
Please see attached notes.  Valetta Fuller requested that I forward this message to you as this is your patient.  Charyl Bigger, CMA

## 2018-11-22 NOTE — Telephone Encounter (Signed)
LVM for pt to call to obtain further information.  Charyl Bigger, CMA

## 2018-11-22 NOTE — Telephone Encounter (Signed)
Forwarding message to medical assistant,Patient says antibiotic causing upset stomach and he is request an alternative or something to curb the diarrhea.  --Pt says will be on tractor today so leave message & he will call back [phone].  --glh

## 2018-11-22 NOTE — Telephone Encounter (Signed)
Please send to Dr. Raliegh Scarlet Thanks! Valetta Fuller

## 2018-11-23 NOTE — Telephone Encounter (Signed)
Called patient and left message to call the office. MPulliam, CMA/RT(R)  

## 2018-11-23 NOTE — Telephone Encounter (Signed)
Ricardo Wolf did telemedicine visit with patient on 11/17/2018 for tooth abscess and started him on Clindamycin-stated patient was having several episodes of GI upset including diarrhea.  Can you please call him and see if he has taken the medicine for a full 6 days now and if his tooth and symptoms are improved or not.   If symptoms have improved and they are now resolved, after 6 days of antibiotic, okay to stop them since they are causing such GI distress. -Also please make patient aware, for tooth abscess, he needs to see his dentist for definitive treatment of this.  Antibiotics are just temporizing him until he gets to see his dentist.  Please make sure he makes this appointment ASAP

## 2018-11-23 NOTE — Telephone Encounter (Signed)
I did not give him an antibiotic recently.  What is he talking about?  What medicine is he talking about?

## 2018-11-23 NOTE — Telephone Encounter (Signed)
Valetta Fuller did telemedicine visit with patient on 11/17/2018 for tooth abscess and started him on Clindamycin (see Katy's notes in chart and messages below).  Valetta Fuller requested this message be forwarded to you.  Please send your response to your CMA, Melissa.  Thanks!  Charyl Bigger, CMA

## 2018-11-24 NOTE — Telephone Encounter (Signed)
Spoke to patient he states that he done 4 days on the antibiotics but was not able to continue.  Patient states that the symptoms related to the tooth have improved he still has some swelling and pain.  Patient has dentist appointment Sept 20.  Please advise. MPulliam, CMA/RT(R)

## 2018-11-24 NOTE — Telephone Encounter (Signed)
Great I am glad his symptoms have improved.  For the swelling and pain he can use ice which would be the best thing to do.  Also, for tooth abscess and acute problems, his dentist should be able to get him in much sooner.  If they really cannot, I recommend he find a new dentist.

## 2018-11-24 NOTE — Telephone Encounter (Signed)
Called and left message to call the office. MPulliam, CMA/RT(R)  

## 2018-11-24 NOTE — Telephone Encounter (Signed)
Called and left message to notify. MPulliam, CMA/RT(R)

## 2018-12-01 ENCOUNTER — Other Ambulatory Visit: Payer: Self-pay

## 2018-12-01 ENCOUNTER — Other Ambulatory Visit: Payer: 59

## 2018-12-01 DIAGNOSIS — Z5181 Encounter for therapeutic drug level monitoring: Secondary | ICD-10-CM

## 2018-12-02 LAB — ALT: ALT: 67 IU/L — ABNORMAL HIGH (ref 0–44)

## 2018-12-05 ENCOUNTER — Encounter: Payer: Self-pay | Admitting: Family Medicine

## 2018-12-05 ENCOUNTER — Other Ambulatory Visit: Payer: Self-pay

## 2018-12-05 ENCOUNTER — Ambulatory Visit: Payer: 59 | Admitting: Family Medicine

## 2018-12-05 VITALS — BP 132/92 | HR 76 | Temp 98.5°F | Ht 72.0 in | Wt 352.7 lb

## 2018-12-05 DIAGNOSIS — E782 Mixed hyperlipidemia: Secondary | ICD-10-CM

## 2018-12-05 DIAGNOSIS — Z1211 Encounter for screening for malignant neoplasm of colon: Secondary | ICD-10-CM

## 2018-12-05 DIAGNOSIS — I10 Essential (primary) hypertension: Secondary | ICD-10-CM

## 2018-12-05 DIAGNOSIS — E1169 Type 2 diabetes mellitus with other specified complication: Secondary | ICD-10-CM | POA: Diagnosis not present

## 2018-12-05 DIAGNOSIS — E559 Vitamin D deficiency, unspecified: Secondary | ICD-10-CM

## 2018-12-05 DIAGNOSIS — R748 Abnormal levels of other serum enzymes: Secondary | ICD-10-CM

## 2018-12-05 DIAGNOSIS — E1129 Type 2 diabetes mellitus with other diabetic kidney complication: Secondary | ICD-10-CM | POA: Diagnosis not present

## 2018-12-05 DIAGNOSIS — Z91199 Patient's noncompliance with other medical treatment and regimen due to unspecified reason: Secondary | ICD-10-CM

## 2018-12-05 DIAGNOSIS — Z9119 Patient's noncompliance with other medical treatment and regimen: Secondary | ICD-10-CM

## 2018-12-05 DIAGNOSIS — R809 Proteinuria, unspecified: Secondary | ICD-10-CM

## 2018-12-05 DIAGNOSIS — I152 Hypertension secondary to endocrine disorders: Secondary | ICD-10-CM

## 2018-12-05 DIAGNOSIS — E1159 Type 2 diabetes mellitus with other circulatory complications: Secondary | ICD-10-CM

## 2018-12-05 HISTORY — DX: Abnormal levels of other serum enzymes: R74.8

## 2018-12-05 LAB — POCT GLYCOSYLATED HEMOGLOBIN (HGB A1C): Hemoglobin A1C: 5.6 % (ref 4.0–5.6)

## 2018-12-05 MED ORDER — JENTADUETO 2.5-1000 MG PO TABS: 1.0000 | ORAL_TABLET | Freq: Two times a day (BID) | ORAL | 0 refills | Status: DC

## 2018-12-05 MED ORDER — PRAVASTATIN SODIUM 20 MG PO TABS: 20.0000 mg | ORAL_TABLET | Freq: Every day | ORAL | 1 refills | Status: DC

## 2018-12-05 MED ORDER — JENTADUETO 2.5-1000 MG PO TABS: 0.5000 | ORAL_TABLET | Freq: Two times a day (BID) | ORAL | 0 refills | Status: DC

## 2018-12-05 NOTE — Patient Instructions (Signed)
Your Jentadueto or the metformin- linagliptin tablet, I want you to take that 1/2 tablet twice daily instead of as written on the prescription is 1 tablet twice daily.  Your A1c was 5.6 is so good that we can now come down off this 1.  We will continue your Trulicity at the current dose because that one helps really control your blood sugar and helps with weight loss.

## 2018-12-05 NOTE — Progress Notes (Signed)
Impression and Recommendations:    1. Type 2 diabetes mellitus with other specified complication, without long-term current use of insulin ([place])   2. Hypertension associated with diabetes ([place])   3. Mixed diabetic hyperlipidemia associated with type 2 diabetes mellitus ([place])   4. Microalbuminuria due to type 2 diabetes mellitus ([place])   5. Morbid obesity ([place])-  BMI 46.2   6. Elevated liver enzymes   7. Vitamin D deficiency   8. Noncompliance-   not taking meds, checking BS or BP, no prudent diet or exercise etc.   9. Colon cancer screening     - Reviewed recent lab work (12/01/2018) in depth with patient today.  All lab work within normal limits unless otherwise noted.  Education provided and all questions were answered.    Diabetes Mellitus - Last A1c was 8.7 - Down to 5.6 after starting Trulicity.- great control now  - Stable at this time. - Reduce dose of Jentadueto BID.  See med list below. - Continue Trulicity as prescribed. - Patient tolerating meds well without complication.  Denies S-E  - Counseled patient on pathophysiology of disease and discussed various treatment options, which often includes dietary and lifestyle modifications as first line.  Importance of low carb diet discussed with patient in addition to regular exercise.   - Check FBS and 2 hours after the biggest meal of your day.  Keep log and bring in next OV for my review.   Also, if you ever feel poorly, please check your blood pressure and blood sugar, as one or the other could be the cause of your symptoms.  - Being a diabetic, you need yearly eye and foot exams. Make appt.for diabetic eye exam.    Elevated Liver Enzymes - ALT down to 67, from 93 in March.  - Encouraged patient to engage in more prudent health habits, such as cutting down alcohol, hydrating adequately, losing weight and exercising more often.  - Will continue to monitor.    Hypertension Associated with DM - Per pt, stable at  home. - Discussed goal BP with patient today.  - Continue treatment plan as established.  See med list below. - Patient tolerating meds well without complication.  Denies S-E  - Lifestyle changes such as dash diet and engaging in a regular exercise program discussed with patient.  Educational handouts provided  - Ambulatory BP monitoring encouraged. Keep log and bring in next OV.    Mixed Diabetic Hyperlipidemia - Not at goal last check.  - Need for re-check in near future as recommended. - Due to patient's elevated liver enzymes, will maintain on current statin.  See med list below. - Patient tolerating meds well without complication.  Denies S-E  - Prudent dietary changes such as low saturated & trans fat and low carb/ ketogenic diets discussed with patient.  Encouraged regular exercise and weight loss.  - Will continue to monitor.    BMI Counseling - BMI of 47.83, Morbidly Obese Explained to patient what BMI refers to, and what it means medically.    Told patient to think about it as a "medical risk stratification measurement" and how increasing BMI is associated with increasing risk/ or worsening state of various diseases such as hypertension, hyperlipidemia, diabetes, premature OA, depression etc.  American Heart Association guidelines for healthy diet, basically Mediterranean diet, and exercise guidelines of 30 minutes 5 days per week or more discussed in detail.  Health counseling performed.  All questions answered.  Pt was  interviewed and evaluated by me in the clinic today for 32.5+ minutes, with over 50% time spent in face to face counseling of patients various medical conditions, treatment plans of those medical conditions including medicine management and lifestyle modification, strategies to improve health and well being; and in coordination of care. SEE ABOVE TREATMENT PLAN FOR DETAILS    Lifestyle & Preventative Health Maintenance - Advised patient to continue  working toward exercising to improve overall mental, physical, and emotional health.    - Healthy dietary habits encouraged, including low-carb, and high amounts of lean protein in diet.   - Patient should also consume adequate amounts of water.   Education and routine counseling performed. Handouts provided.  - Need for CPE and FBW in near future. - Patient declines colonoscopy and wishes for ColoGuard instead.   Orders Placed This Encounter  Procedures  . Cologuard  . POCT glycosylated hemoglobin (Hb A1C)    Medications Discontinued During This Encounter  Medication Reason  . clindamycin (CLEOCIN) 300 MG capsule Completed Course  . linaGLIPtin-metFORMIN HCl (JENTADUETO) 2.08-998 MG TABS Patient Preference  . fexofenadine (ALLEGRA) 180 MG tablet Patient Preference  . linaGLIPtin-metFORMIN HCl (JENTADUETO) 2.08-998 MG TABS   . pravastatin (PRAVACHOL) 40 MG tablet Reorder      Meds ordered this encounter  Medications  . linaGLIPtin-metFORMIN HCl (JENTADUETO) 2.08-998 MG TABS    Sig: Take 0.5 tablets by mouth 2 (two) times daily.    Dispense:  90 tablet    Refill:  0  . pravastatin (PRAVACHOL) 20 MG tablet    Sig: Take 1 tablet (20 mg total) by mouth at bedtime.    Dispense:  90 tablet    Refill:  1    The patient was counseled, risk factors were discussed, anticipatory guidance given.  Gross side effects, risk and benefits, and alternatives of medications discussed with patient.  Patient is aware that all medications have potential side effects and we are unable to predict every side effect or drug-drug interaction that may occur.  Expresses verbal understanding and consents to current therapy plan and treatment regimen.   Return for 19mo CPE and FBW same day.   Please see AVS handed out to patient at the end of our visit for further patient instructions/ counseling done pertaining to today's office visit.    Note:  This document was prepared using Dragon voice  recognition software and may include unintentional dictation errors.   This document serves as a record of services personally performed by DMellody Dance DO. It was created on her behalf by KToni Amend a trained medical scribe. The creation of this record is based on the scribe's personal observations and the provider's statements to them.   I have reviewed the above medical documentation for accuracy and completeness and I concur.  DMellody Dance DO 12/06/2018 8:34 AM     Subjective:    Chief Complaint  Patient presents with  . Follow-up     HLadarren Wolf a 53y.o. [facility].    Patient does not want to do colonoscopy.  States "if I don't have to, I don't want to."  Liver Enzymes Patient says "all he did" was stopped taking alleve.  Says he's still drinking and doing everything else the same.  DM HPI: -  He has been working on diet, but not exercise for diabetes.  Says "I don't eat nowhere near as much as I used  to."  Confirms feeling less hungry on Trulicity, and feels that he's lost weight since last visit.  Patient was 370 lbs on March 9, now down to 352 lbs.  Says it's "hard to eat anything decently," but repeats he doesn't eat near as much as he used to.  Pt is currently maintained on the following medications for diabetes:   see med list today Medication compliance - continues on medications as prescribed.  Says sometimes he forgets to take his "sugar pill" in the morning, but not at night.  Home glucose readings range between 123 and 127 fasting in the mornings.  Feels his sugars are always fine and that he isn't experiencing lows.  Says he usually doesn't check in the afternoon.   Denies polyuria/polydipsia. Denies hypo/ hyperglycemia symptoms - He denies new onset of: chest pain, exercise intolerance, shortness of breath, dizziness, visual changes, headache, lower  extremity swelling or claudication.   Last diabetic eye exam was No results found for: HMDIABEYEEXA  Foot exam- UTD  Last A1C in the office was:  Lab Results  Component Value Date   HGBA1C 5.6 12/05/2018   HGBA1C 8.7 (A) 07/04/2018   HGBA1C 5.5 12/17/2017    Lab Results  Component Value Date   LDLCALC 109 (H) 07/04/2018   CREATININE 0.85 07/04/2018    1. HTN HPI:  -  His blood pressure has been controlled at home.  Pt is checking it at home.   Notes his BP runs around 126/88 at home. Confirms lower number does run higher, but not over 90 at all.  - Patient reports good compliance with blood pressure medications  - Denies medication S-E   - Smoking Status noted   - He denies new onset of: chest pain, exercise intolerance, shortness of breath, dizziness, visual changes, headache, lower extremity swelling or claudication.   Last 3 blood pressure readings in our office are as follows: BP Readings from Last 3 Encounters:  12/05/18 (!) 132/92  08/25/18 127/85  08/18/18 128/87    Filed Weights   12/05/18 1100  Weight: (!) 352 lb 11.2 oz (160 kg)       2. 53 y.o. male here for cholesterol follow-up.   - Patient reports good compliance with medications or treatment plan  - Denies medication S-E   - Smoking Status noted   - He denies new onset of: chest pain, exercise intolerance, shortness of breath, dizziness, visual changes, headache, lower extremity swelling or claudication.   Denies myalgias  The cholesterol last visit was:  Lab Results  Component Value Date   CHOL 199 07/04/2018   HDL 43 07/04/2018   LDLCALC 109 (H) 07/04/2018   TRIG 236 (H) 07/04/2018   CHOLHDL 4.6 07/04/2018    Hepatic Function Latest Ref Rng & Units 12/01/2018 07/04/2018 12/15/2017  Total Protein 6.0 - 8.5 g/dL - 7.8 7.8  Albumin 3.8 - 4.9 g/dL - 4.6 4.7  AST 0 - 40 IU/L - 78(H) 34  ALT 0 - 44 IU/L 67(H) 93(H) 34  Alk Phosphatase 39 - 117 IU/L - 86 69  Total Bilirubin 0.0 - 1.2  mg/dL - 0.4 0.8    Last 3 blood pressure readings in our office are as follows: BP Readings from Last 3 Encounters:  12/05/18 (!) 132/92  08/25/18 127/85  08/18/18 128/87    BMI Readings from Last 3 Encounters:  12/05/18 47.83 kg/m  08/25/18 49.10 kg/m  08/18/18 47.64 kg/m     No problems updated.    Patient  Care Team    Relationship Specialty Notifications Start End  Mellody Dance, DO PCP - General Family Medicine  11/10/17      Patient Active Problem List   Diagnosis Date Noted  . Microalbuminuria due to type 2 diabetes mellitus ([place]) 09/10/2018    Priority: High  . Diabetes mellitus ([place]) 11/10/2017    Priority: High  . Hypertension associated with diabetes ([place]) 11/10/2017    Priority: High  . Mixed diabetic hyperlipidemia associated with type 2 diabetes mellitus ([place]) 11/10/2017    Priority: High  . Morbid obesity ([place])-  BMI 46.2 11/10/2017    Priority: High  . History of smoking - 75-pack-year history; quit 2007 09/10/2018    Priority: Medium    Class: Health Concern  . Noncompliance 11/10/2017    Priority: Medium  . Family history of secondary lung cancer- father late 30s, heavy smoker 09/10/2018    Priority: Low  . Vitamin D deficiency 12/17/2017    Priority: Low  . Elevated liver enzymes 12/05/2018  . Tooth abscess 11/17/2018     Past Medical History:  Diagnosis Date  . Diabetes ([place])   . High cholesterol   . Hypertension      Past Surgical History:  Procedure Laterality Date  . BACK SURGERY  2003  . BACK SURGERY  2011     Family History  Problem Relation Age of Onset  . Diabetes Mother   . Lung cancer Father   . Diabetes Sister      Social History   Substance and Sexual Activity  Drug Use Never  ,  Social History   Substance and Sexual Activity  Alcohol Use Yes  . Alcohol/week: 2.0 standard drinks  . Types: 2 Standard drinks or equivalent per week  ,  Social History   Tobacco Use  Smoking Status Former Smoker   . Packs/day: 1.00  . Types: Cigarettes  . Quit date: 2007  . Years since quitting: 13.6  Smokeless Tobacco Never Used  ,    Current Outpatient Medications on File Prior to Visit  Medication Sig Dispense Refill  . lisinopril (PRINIVIL,ZESTRIL) 20 MG tablet Take 1 tablet (20 mg total) by mouth daily. 90 tablet 1  . metoprolol succinate (TOPROL-XL) 100 MG 24 hr tablet Take 1 tablet (100 mg total) by mouth daily. 90 tablet 1   No current facility-administered medications on file prior to visit.      Allergies  Allergen Reactions  . Keflet [Cephalexin] Rash  . Penicillins Rash  . Sulfa Antibiotics Rash     Review of Systems:   General:  Denies fever, chills Optho/Auditory:   Denies visual changes, blurred vision Respiratory:   Denies SOB, cough, wheeze, DIB  Cardiovascular:   Denies chest pain, palpitations, painful respirations Gastrointestinal:   Denies nausea, vomiting, diarrhea.  Endocrine:     Denies new hot or cold intolerance Musculoskeletal:  Denies joint swelling, gait issues, or new unexplained myalgias/ arthralgias Skin:  Denies rash, suspicious lesions  Neurological:    Denies dizziness, unexplained weakness, numbness  Psychiatric/Behavioral:   Denies mood changes    Objective:     Blood pressure (!) 132/92, pulse 76, temperature 98.5 F (36.9 C), height 6' (1.829 m), weight (!) 352 lb 11.2 oz (160 kg), SpO2 95 %.  Body mass index is 47.83 kg/m.  General: Well Developed, well nourished, and in no acute distress.  HEENT: Normocephalic, atraumatic, pupils equal round reactive to light, neck supple, No carotid bruits, no JVD Skin: Warm and dry,  cap RF less 2 sec Cardiac: Regular rate and rhythm, S1, S2 WNL's, no murmurs rubs or gallops Respiratory: ECTA B/L, Not using accessory muscles, speaking in full sentences. NeuroM-Sk: Ambulates w/o assistance, moves ext * 4 w/o difficulty, sensation grossly intact.  Ext: scant edema b/l lower ext Psych: No HI/SI,  judgement and insight good, Euthymic mood. Full Affect.

## 2018-12-10 ENCOUNTER — Other Ambulatory Visit: Payer: Self-pay | Admitting: Family Medicine

## 2018-12-10 DIAGNOSIS — E1169 Type 2 diabetes mellitus with other specified complication: Secondary | ICD-10-CM

## 2018-12-19 ENCOUNTER — Other Ambulatory Visit: Payer: Self-pay | Admitting: Family Medicine

## 2018-12-19 DIAGNOSIS — E559 Vitamin D deficiency, unspecified: Secondary | ICD-10-CM

## 2019-01-05 ENCOUNTER — Other Ambulatory Visit: Payer: Self-pay | Admitting: Family Medicine

## 2019-01-05 DIAGNOSIS — E1169 Type 2 diabetes mellitus with other specified complication: Secondary | ICD-10-CM

## 2019-01-12 ENCOUNTER — Other Ambulatory Visit: Payer: Self-pay

## 2019-01-12 ENCOUNTER — Encounter: Payer: Self-pay | Admitting: Family Medicine

## 2019-01-12 ENCOUNTER — Ambulatory Visit (INDEPENDENT_AMBULATORY_CARE_PROVIDER_SITE_OTHER): Payer: 59 | Admitting: Family Medicine

## 2019-01-12 VITALS — BP 157/65 | HR 91 | Resp 20 | Ht 73.0 in | Wt 347.0 lb

## 2019-01-12 DIAGNOSIS — M62830 Muscle spasm of back: Secondary | ICD-10-CM | POA: Diagnosis not present

## 2019-01-12 DIAGNOSIS — M5442 Lumbago with sciatica, left side: Secondary | ICD-10-CM | POA: Diagnosis not present

## 2019-01-12 DIAGNOSIS — M5416 Radiculopathy, lumbar region: Secondary | ICD-10-CM

## 2019-01-12 MED ORDER — CYCLOBENZAPRINE HCL 10 MG PO TABS: 10.0000 mg | ORAL_TABLET | Freq: Three times a day (TID) | ORAL | 0 refills | Status: DC | PRN

## 2019-01-12 MED ORDER — PREDNISONE 20 MG PO TABS: ORAL_TABLET | ORAL | 0 refills | Status: DC

## 2019-01-12 MED ORDER — GABAPENTIN 600 MG PO TABS: ORAL_TABLET | ORAL | 3 refills | Status: DC

## 2019-01-12 NOTE — Progress Notes (Signed)
Impression and Recommendations:    1. Acute back pain with sciatica, left   2. Muscle spasm of back   3. Left lumbar radiculopathy    - restrictions d/c pt for work. No heavy lifting over 5 lbs, no twisting and lifting at all - Pt declines PT referral - ice vs heating pad d/c pt - r/b meds d/c pt - desires them   Education and routine counseling performed. Handouts provided.   Return for - F-up of current med issues as previously d/c pt.  Gross side effects, risk and benefits, and alternatives of medications discussed with patient.  Patient is aware that all medications have potential side effects and we are unable to predict every sideeffect or drug-drug interaction that may occur.  Expresses verbal understanding and consents to current therapy plan and treatment regiment.  Please see AVS handed out to patient at the end of our visit for further patient instructions/ counseling done pertaining to today's office visit.  I have reviewed the above documentation for accuracy and completeness, and I agree with the above.   Mellody Dance 01/14/19 8:15 PM   -------------------------------------------------------------------------------------------------------------------------------------------------------------------------------------------------------------------------------------------- Subjective:   CC: BACK PAIN   sx started 2 wks ago. No issuing event  W in past 1 week  Location: L lower back, radiates to L hip- travels down outside entire leg into the foot and not into the toes yet.  Quality: Stabbing and sharp at times if he moves the wrong way   Worse: with walking makes it worse, certain positions--- sitting one position malkes worse  Better with :  Heat followed by ice packs and lying in bed not moving  Onset: just awoke one day and pain was there  Trauma: no   Best sitting/standing/leaning forward: best sitting , leaning forward makes - no change    Red Flags Fecal/urinary incontinence: no  Numbness/Weakness: yes, L leg ever since pain started -feels like he will not be able to hold himself up Fever/chills/sweats: no  Night pain: Yes the pain in his left leg wakes him from sleep. Unexplained weight loss: no  No relief with bedrest: no  h/o cancer/immunosuppression: no  IV drug use: no   Patient has had pain like this several times in the past.  He has a history of 2 back surgery.  When he has had pain like this it would only go away when he went in and got shots in his back.  Last lumbar spine was done in 7 of 2011.  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  Denies that he recalls any problems with his blood sugars and taking prednisone    Patient Care Team    Relationship Specialty Notifications Start End  Mellody Dance, DO PCP - General Family Medicine  11/10/17     The following portions of the patient's history were reviewed and updated as appropriate: allergies, current medications, past family history, past medical history, past social history, past surgical history and problem list.  Previous Medications   DULAGLUTIDE (TRULICITY) A999333 0000000 SOPN    Inject 0.5 mLs into the skin once a week.   LINAGLIPTIN-METFORMIN HCL (JENTADUETO) 2.08-998 MG TABS    Take 0.5 tablets by mouth 2 (two) times daily.   LISINOPRIL (PRINIVIL,ZESTRIL) 20 MG TABLET    Take  1 tablet (20 mg total) by mouth daily.   METOPROLOL SUCCINATE (TOPROL-XL) 100 MG 24 HR TABLET    Take 1 tablet (100 mg total) by mouth daily.   PRAVASTATIN (PRAVACHOL) 20 MG TABLET    Take 1 tablet (20 mg total) by mouth at bedtime.   VITAMIN D, ERGOCALCIFEROL, (DRISDOL) 1.25 MG (50000 UT) CAPS CAPSULE    TAKE 1 CAPSULE ON WED AND SUN WEEKLY    Review of  Systems: General:   Denies fever, chills, unexplained weight loss.  Optho/Auditory:   Denies visual changes, blurred vision/LOV Respiratory:   Denies SOB, DOE more than baseline levels.   Cardiovascular:   Denies chest pain, palpitations, new onset peripheral edema  Gastrointestinal:   Denies nausea, vomiting, diarrhea.  Genitourinary: Denies dysuria, freq/ urgency, flank pain or discharge from genitals.  Endocrine:     Denies hot or cold intolerance, polyuria, polydipsia. Musculoskeletal:   + myalgias, denies joint swelling, + arthralgias, denies gait problems.  Skin:  Denies rash, suspicious lesions Neurological:     Denies dizziness, unexplained weakness, numbness  Psychiatric/Behavioral:   Denies mood changes, suicidal or homicidal ideations, hallucinations    Objective:  Blood pressure (!) 157/65, pulse 91, resp. rate 20, height 6\' 1"  (1.854 m), weight (!) 347 lb (157.4 kg), SpO2 97 %. Body mass index is 45.78 kg/m.   Gen: A & O *3,  No acute distress HEENT:  [place]/AT Back Exam:  Inspection: Unremarkable  Motion: Flexion 45 deg, Extension 45 deg, Side Bending to 45 deg bilaterally,  Rotation to 45 deg bilaterally  SLR laying: Negative  XSLR laying: Negative  Palpable tenderness: None. FABER: negative. Sensory change: Gross sensation intact to all lumbar and sacral dermatomes.  Reflexes: 2+ at both patellar tendons, 2+ at achilles tendons, Babinski's downgoing.  Strength at foot  Plantar-flexion: 5/5 Dorsi-flexion: 5/5 Eversion: 5/5 Inversion: 5/5  Leg strength  Quad: 5/5 Hamstring: 5/5 Hip flexor: 5/5 Hip abductors: 5/5  Gait unremarkable.

## 2019-01-12 NOTE — Patient Instructions (Signed)
I have seen Ricardo Wolf today for his acute back pain.  We are sending him for further evaluation with a specialist.  For now due to his significant pain and immobility problems, I do not recommend he do any heavy lifting for staying in 1 position for any prolonged period of time.  -Further management and instruction will be per the back specialist.

## 2019-01-13 ENCOUNTER — Telehealth: Payer: Self-pay | Admitting: Family Medicine

## 2019-01-13 NOTE — Telephone Encounter (Signed)
Take 3 pills a day for 2 days, 2 pills a day for 2 days, 1 pill a day for 2 days then one half pill a day for 2 days then off  Is what provider spoke to patient about and put in prescription for prednisone. Pharmacy instructions read to take 6 pills a day for 3 days. Unsure where that information came from but conferred with patient that he is to take it how discussed at visit

## 2019-01-13 NOTE — Telephone Encounter (Signed)
Patient called states Dosing instructions differ on package from the ones Dr. Raliegh Scarlet told him. --forwarding message to medical assistant for review w/provider & to contact pt w/ dosing instructions clarification.@ [phone].   --glh

## 2019-01-16 ENCOUNTER — Telehealth: Payer: Self-pay | Admitting: Family Medicine

## 2019-01-16 NOTE — Telephone Encounter (Signed)
Pt is requesting a note to be out of work until he can see the "back specialist" because his employer doesn't have "light duty" work for him.  Letter needs to be sent to Abelina Bachelor at [employer].  Pt to call back with a fax number.  Per Dr. Raliegh Scarlet, pt may return to work with light duty only. No lifting over 5 lbs.  It will be up to the employer if they can find him light duty work or not.  Charyl Bigger, CMA

## 2019-01-16 NOTE — Telephone Encounter (Signed)
Pt informed.  Pt provided fax number.  Letter and fax information given to Annabell Sabal to fax.  Charyl Bigger, CMA

## 2019-01-16 NOTE — Telephone Encounter (Signed)
Patient request nurse or provider call him @ [phone].  -glh

## 2019-01-16 NOTE — Telephone Encounter (Signed)
Patient called to check status of referral to Neuro Surgery-- Per pt this provider did back surgery on him in 2011 :  Ricardo Wolf. Guy Begin.,  Neurosurgeon in [place], [place]  Get online care: [email or web address]  Address: [address], [place], [place] [address]  Phone: [phone] ---Forwarding this information to [facility] / referral coord.  --glh

## 2019-01-26 ENCOUNTER — Other Ambulatory Visit: Payer: Self-pay | Admitting: Neurosurgery

## 2019-01-30 ENCOUNTER — Other Ambulatory Visit: Payer: Self-pay | Admitting: Family Medicine

## 2019-01-30 DIAGNOSIS — E1169 Type 2 diabetes mellitus with other specified complication: Secondary | ICD-10-CM

## 2019-02-01 ENCOUNTER — Encounter (HOSPITAL_COMMUNITY): Admission: RE | Payer: Self-pay | Source: Home / Self Care

## 2019-02-01 ENCOUNTER — Ambulatory Visit (HOSPITAL_COMMUNITY): Admission: RE | Admit: 2019-02-01 | Payer: 59 | Source: Home / Self Care | Admitting: Neurosurgery

## 2019-02-01 SURGERY — LUMBAR LAMINECTOMY/DECOMPRESSION MICRODISCECTOMY 1 LEVEL
Anesthesia: General | Site: Back | Laterality: Left

## 2019-02-11 ENCOUNTER — Other Ambulatory Visit: Payer: Self-pay | Admitting: Family Medicine

## 2019-02-11 DIAGNOSIS — E1169 Type 2 diabetes mellitus with other specified complication: Secondary | ICD-10-CM

## 2019-02-24 ENCOUNTER — Other Ambulatory Visit: Payer: Self-pay | Admitting: Family Medicine

## 2019-02-24 DIAGNOSIS — E1169 Type 2 diabetes mellitus with other specified complication: Secondary | ICD-10-CM

## 2019-03-15 ENCOUNTER — Encounter: Payer: Self-pay | Admitting: Family Medicine

## 2019-03-15 ENCOUNTER — Ambulatory Visit: Payer: 59 | Admitting: Family Medicine

## 2019-03-15 ENCOUNTER — Other Ambulatory Visit: Payer: Self-pay | Admitting: Family Medicine

## 2019-03-15 ENCOUNTER — Other Ambulatory Visit: Payer: Self-pay

## 2019-03-15 VITALS — BP 121/87 | HR 97 | Temp 97.6°F | Resp 12 | Ht 74.0 in | Wt 343.6 lb

## 2019-03-15 DIAGNOSIS — E1169 Type 2 diabetes mellitus with other specified complication: Secondary | ICD-10-CM

## 2019-03-15 DIAGNOSIS — E559 Vitamin D deficiency, unspecified: Secondary | ICD-10-CM

## 2019-03-15 DIAGNOSIS — Z91199 Patient's noncompliance with other medical treatment and regimen due to unspecified reason: Secondary | ICD-10-CM

## 2019-03-15 DIAGNOSIS — Z23 Encounter for immunization: Secondary | ICD-10-CM | POA: Diagnosis not present

## 2019-03-15 DIAGNOSIS — E1159 Type 2 diabetes mellitus with other circulatory complications: Secondary | ICD-10-CM

## 2019-03-15 DIAGNOSIS — R748 Abnormal levels of other serum enzymes: Secondary | ICD-10-CM

## 2019-03-15 DIAGNOSIS — R809 Proteinuria, unspecified: Secondary | ICD-10-CM

## 2019-03-15 DIAGNOSIS — E782 Mixed hyperlipidemia: Secondary | ICD-10-CM

## 2019-03-15 DIAGNOSIS — E1129 Type 2 diabetes mellitus with other diabetic kidney complication: Secondary | ICD-10-CM

## 2019-03-15 DIAGNOSIS — Z9119 Patient's noncompliance with other medical treatment and regimen: Secondary | ICD-10-CM

## 2019-03-15 DIAGNOSIS — I10 Essential (primary) hypertension: Secondary | ICD-10-CM

## 2019-03-15 DIAGNOSIS — I152 Hypertension secondary to endocrine disorders: Secondary | ICD-10-CM

## 2019-03-15 NOTE — Progress Notes (Signed)
Impression and Recommendations:    1. Type 2 diabetes mellitus with other specified complication, without long-term current use of insulin ([place])   2. Hypertension associated with diabetes ([place])   3. Mixed diabetic hyperlipidemia associated with type 2 diabetes mellitus ([place])   4. Morbid obesity ([place])-  BMI 46.2   5. Microalbuminuria due to type 2 diabetes mellitus ([facility])   6. Elevated liver enzymes   7. Noncompliance-   not taking meds, checking BS or BP, no prudent diet or exercise etc.   8. Vitamin D deficiency   9. Need for influenza vaccination     - Last OV 12/05/2018.  Last labs obtained 07/04/2018. - Need for repeat lab work today and CPE near future.   Vitamin D Deficiency - Need for re-check today. - Continue supplementation as established. - Will continue to monitor.   Diabetes Mellitus  - Discussed that patient's A1c went up to 8.7 eight months ago, from 5.5 one year ago, then back down to 5.6 three months ago.  Advised patient of critical need to monitor his blood sugar due to his history of large blood sugar swings.  Lab Results  Component Value Date   HGBA1C 5.4 03/15/2019   HGBA1C 5.6 12/05/2018   HGBA1C 8.7 (A) 07/04/2018    - Last visit decreased dose of Jentadueto and continued treatment plan otherwise. Pt does not wish to decrease dose of any meds at this time although I recommend.  He desires to reck in another 3 months, and we will adjust then if still low.    - told pt to let us know if BS running low at any time and to keep a close eye on things!  - Counseled patient on pathophysiology of disease and discussed various treatment options, which always includes dietary and lifestyle modification as first line.    - Importance of low carb, heart-healthy diet discussed with patient in addition to regular aerobic exercise of 33mn 5d/week or more.   - Check FBS and 2 hours after the biggest meal of your day.  Keep log and bring in next OV for my  review.     - Also told patient if you ever feel poorly, please check your blood pressure and blood sugar, as one or the other could be the cause of your symptoms.  - Pt reminded about need for yearly eye and foot exams.  Told patient to make appt.for diabetic eye exam, CMAs here will do foot exams  - Handouts provided at patient's desire and or told to go online at the American Diabetes Association website for further information  - We will continue to monitor   Hypertension associated with DM - Stable in office today.  Not completely at goal yet of 130/80 but closer than prior - Continue management as established for now; continue to work on diet and inc activity. - Ongoing ambulatory blood pressure monitoring encouraged. - Told patient to keep a close eye on his blood pressure at home. - Will continue to monitor.   Hyperlipidemia - Need for re-check today. - Continue management as established.  - Dietary changes such as low saturated & trans fat diets for hyperlipidemia and low carb diets for hypertriglyceridemia discussed with patient.    - Encouraged patient to follow AHA guidelines for regular exercise and also engage in weight loss if BMI above 25.   - Will continue to monitor.   Elevated Liver Enzymes - Back Pain (Recent Back Surgery) -  Need for re-check today. - Will continue to monitor.  - Per patient, taking oxycodone, ibuprofen 800, and a muscle relaxer. - Per patient, taking ibuprofen 800 every 6-8 hours since October. - Discussed that this use of ibuprofen may adversely affect his kidney function. - Reviewed that use of additional pain medications may affect his liver function. - Need for check of kidney function and liver enzymes today.   BMI Counseling - Body mass index is 44.12 kg/m Explained to patient what BMI refers to, and what it means medically.    Told patient to think about it as a "medical risk stratification measurement" and how increasing BMI is  associated with increasing risk/ or worsening state of various diseases such as hypertension, hyperlipidemia, diabetes, premature OA, depression etc.  American Heart Association guidelines for healthy diet, basically Mediterranean diet, and exercise guidelines of 30 minutes 5 days per week or more discussed in detail.  Health counseling performed.  All questions answered.   Lifestyle & Preventative Health Maintenance - Advised patient to continue working toward exercising to improve overall mental, physical, and emotional health.    - Reviewed the "spokes of the wheel" of mood and health management.  Stressed the importance of ongoing prudent habits, including regular exercise, appropriate sleep hygiene, healthful dietary habits, and prayer/meditation to relax.  - Encouraged patient to engage in daily physical activity as tolerated, especially a formal exercise routine.  Recommended that the patient eventually strive for at least 150 minutes of moderate cardiovascular activity per week according to guidelines established by the [facility].   - Healthy dietary habits encouraged, including low-carb, and high amounts of lean protein in diet.   - Patient should also consume adequate amounts of water.   Recommendations - Follow up in 3 months. - If blood pressure, liver, cholesterol have improved on labs, may bump out f/up visit. - pt will f/up sooner than planned/ get in touch with [place] if BS running low etc. - When back is more healed, discussed need for return for CPE. - Flu shot declined.   Orders Placed This Encounter  Procedures  . Flu Vaccine QUAD 6+ mos PF IM (Fluarix Quad PF)  . Vit D  . Lipid panel  . Comprehensive metabolic panel with GFR  . Hgb A1c    No orders of the defined types were placed in this encounter.   Medications Discontinued During This Encounter  Medication Reason  . gabapentin (NEURONTIN) 600 MG tablet Error  . predniSONE (DELTASONE) 20 MG tablet Error      Gross side effects, risk and benefits, and alternatives of medications and treatment plan in general discussed with patient.  Patient is aware that all medications have potential side effects and we are unable to predict every side effect or drug-drug interaction that may occur.   Patient will call with any questions prior to using medication if they have concerns.    Expresses verbal understanding and consents to current therapy and treatment regimen.  No barriers to understanding were identified.  Red flag symptoms and signs discussed in detail.  Patient expressed understanding regarding what to do in case of emergency\urgent symptoms  Please see AVS handed out to patient at the end of our visit for further patient instructions/ counseling done pertaining to today's office visit.   Return for f/up  in 3 months, labs pending; return for CPE when back is healed.     Note:  This note was prepared with assistance of Dragon voice recognition software. Occasional  wrong-word or sound-a-like substitutions may have occurred due to the inherent limitations of voice recognition software.   This document serves as a record of services personally performed by Mellody Dance, DO. It was created on her behalf by Toni Amend, a trained medical scribe. The creation of this record is based on the scribe's personal observations and the provider's statements to them.   This case required medical decision making of at least moderate complexity. The above documentation has been reviewed to be accurate and was completed by Marjory Sneddon, D.O.      --------------------------------------------------------------------------------------------------------------------------------------------------------------------------------------------------------------------------------------------    Subjective:    Ricardo Wolf, am serving as scribe for Dr. Mellody Dance.  HPI: Ricardo Wolf is a 53  y.o. male who presents to [facility] today for issues as discussed below.  - Vitamin D Deficiency Confirms that he takes his Vitamin D on Sunday and Wednesday.  - Weight Loss Weighed 362 in April, now down to 343.  Notes "I was all the way down to 321."  - Recent Back Surgery Says he just had back surgery done on October 7, and "it just hurts so much to bend over and stuff."  Notes it took him 45 minutes to get dressed this morning.  Last checked up with specialist Dr. Saintclair Halsted on November 10 "to see how I was doing, and he just set me up with physical therapy."  He will be following up with [facility].  Takes a pain pill and muscle relaxer to go to bed.  Notes his wife has been taking care of him a lot.  Notes his short-term disability ends on the 29th of November.  He is hoping to go back to work by then.  Notes "the way I feel, I doubt it."  HPI:   Diabetes Mellitus:  Home glucose readings:  Has not been checking them.  Notes "been pretty much out of it."   - Patient reports good compliance with therapy plan: medication and/or lifestyle modification.  Says he's been taking his medicines; "usually taking them every day, might forget a day here and there."  - His denies acute concerns or problems related to treatment plan  - He denies new concerns.  Denies polyuria/polydipsia, hypo/ hyperglycemia symptoms.  Denies new onset of: chest pain, exercise intolerance, shortness of breath, dizziness, visual changes, headache, lower extremity swelling or claudication.   Last A1C in the office was:  Lab Results  Component Value Date   HGBA1C 5.4 03/15/2019   HGBA1C 5.6 12/05/2018   HGBA1C 8.7 (A) 07/04/2018   Lab Results  Component Value Date   LDLCALC 105 (H) 03/15/2019   CREATININE 0.94 03/15/2019   Wt Readings from Last 3 Encounters:  03/15/19 (!) 343 lb 9.6 oz (155.9 kg)  01/12/19 (!) 347 lb (157.4 kg)  12/05/18 (!) 352 lb 11.2 oz (160 kg)   HPI:   Hypertension:  -  Has not been taking his BP at home.  Notes "doctor up there has been checking it and it's been pretty high."  Says Dr. Saintclair Halsted told him that "you're blood pressure is going to be high because of the meds that you're taking."  - Patient reports good compliance with medication and/or lifestyle modification  - His denies acute concerns or problems related to treatment plan  - He denies new onset of: chest pain, exercise intolerance, shortness of breath, dizziness, visual changes, headache, lower extremity swelling or claudication.   Last 3 blood pressure readings in  our office are as follows: BP Readings from Last 3 Encounters:  03/15/19 121/87  01/12/19 (!) 157/65  12/05/18 (!) 132/92   Filed Weights   03/15/19 0816  Weight: (!) 343 lb 9.6 oz (155.9 kg)   HPI:  Hyperlipidemia:  53 y.o. male here for cholesterol follow-up.   - Patient reports good compliance with treatment plan of:  medication and/ or lifestyle management.    - Patient denies any acute concerns or problems with management plan   - He denies new onset of: myalgias, arthralgias, increased fatigue more than normal, chest pains, exercise intolerance, shortness of breath, dizziness, visual changes, headache, lower extremity swelling or claudication.   Most recent cholesterol panel was:  Lab Results  Component Value Date   CHOL 163 03/15/2019   HDL 34 (L) 03/15/2019   LDLCALC 105 (H) 03/15/2019   TRIG 134 03/15/2019   CHOLHDL 4.8 03/15/2019   Hepatic Function Latest Ref Rng & Units 03/15/2019 12/01/2018 07/04/2018  Total Protein 6.0 - 8.5 g/dL 7.6 - 7.8  Albumin 3.8 - 4.9 g/dL 4.3 - 4.6  AST 0 - 40 IU/L 51(H) - 78(H)  ALT 0 - 44 IU/L 47(H) 67(H) 93(H)  Alk Phosphatase 39 - 117 IU/L 74 - 86  Total Bilirubin 0.0 - 1.2 mg/dL 1.1 - 0.4    " Notes recorded by Mellody Dance, DO on 07/05/2018 at 6:16 PM EDT  Not sure why his liver enzymes jumped up significantly as it could be from medications ( esp if  any of the medicines were new in the past 4 to 6 months or it could be over-the-counter medicine such as Tylenol etc. that he is taking if metabolized through the liver this could be causing it as well. )     Also let patient know he will have to cut his statin medication in half. Have him take a half a tablet.  (I have already made this adjustment on the med list)    -Please tell him this is unfortunate as his cholesterol panel is significantly worse than 6 months ago as well. He will need to really work hard on diet and exercise to help improve his numbers. Also his triglycerides are extremely up due to his poorly controlled diabetes. These need to improve.   -I am surprised with him supposedly taking vitamin D tablets every Wednesday and Sunday, cannot quite understand why his vitamin D is only at 30 and not higher since prior to no supplementation it was 26. Please tell him it is very important he take this medicine as prescribed   Lastly, please let patient know that his microalbumin to creatinine ratio is significantly up!!  6 months ago it was normal and now it is almost in the "severely increased" range/ macroalbuminuria.  He really needs to work on controlling his blood sugars and blood pressures and getting them and much tighter control as, you can see, with his blood sugars being so poorly controlled several items of his health and wellbeing are worsened  - I recommend we recheck in 12 months and if this is not significantly better, patient may need to go to a nephrologist   Notes recorded by Mellody Dance, DO on 07/05/2018 at 5:38 PM EDT  Please call patient let him know that his diabetes went from 5.5 to now 8.7.   This is very poorly controlled and he cannot stop his insulin or any of his other diabetes meds he was on prior to our recent office visit.  He must resume all of them he was on prior.    Tell him this is very shocking and not consistent with the blood sugars  he has been getting at home thus, please make sure he is checking his blood sugars fasting as well as 2 hours after his largest meal, during times when he does not eat healthy as well as other times. Please write this down in a book and bring in next office visit   8.7 is very poorly controlled and, can cause significant damage to vital organs of his body. He will really need to work on diet and lifestyle modification as going from 5.5 to 8.7 is a crazy change "     Wt Readings from Last 3 Encounters:  03/15/19 (!) 343 lb 9.6 oz (155.9 kg)  01/12/19 (!) 347 lb (157.4 kg)  12/05/18 (!) 352 lb 11.2 oz (160 kg)   BP Readings from Last 3 Encounters:  03/15/19 121/87  01/12/19 (!) 157/65  12/05/18 (!) 132/92   Pulse Readings from Last 3 Encounters:  03/15/19 97  01/12/19 91  12/05/18 76   BMI Readings from Last 3 Encounters:  03/15/19 44.12 kg/m  01/12/19 45.78 kg/m  12/05/18 47.83 kg/m     Patient Care Team    Relationship Specialty Notifications Start End  Mellody Dance, DO PCP - General Family Medicine  11/10/17      Patient Active Problem List   Diagnosis Date Noted  . Microalbuminuria due to type 2 diabetes mellitus ([place]) 09/10/2018    Priority: High  . Diabetes mellitus ([place]) 11/10/2017    Priority: High  . Hypertension associated with diabetes ([place]) 11/10/2017    Priority: High  . Mixed diabetic hyperlipidemia associated with type 2 diabetes mellitus ([place]) 11/10/2017    Priority: High  . Morbid obesity ([place])-  BMI 46.2 11/10/2017    Priority: High  . History of smoking - 75-pack-year history; quit 2007 09/10/2018    Priority: Medium    Class: Health Concern  . Noncompliance 11/10/2017    Priority: Medium  . Family history of secondary lung cancer- father late 85s, heavy smoker 09/10/2018    Priority: Low  . Vitamin D deficiency 12/17/2017    Priority: Low  . Elevated liver enzymes 12/05/2018  . Tooth abscess 11/17/2018    Past Medical history,  Surgical history, Family history, Social history, Allergies and Medications have been entered into the medical record, reviewed and changed as needed.    Current Meds  Medication Sig  . cyclobenzaprine (FLEXERIL) 10 MG tablet Take 1 tablet (10 mg total) by mouth 3 (three) times daily as needed for muscle spasms.  Marland Kitchen ibuprofen (ADVIL) 800 MG tablet   . lisinopril (PRINIVIL,ZESTRIL) 20 MG tablet Take 1 tablet (20 mg total) by mouth daily.  . metoprolol succinate (TOPROL-XL) 100 MG 24 hr tablet Take 1 tablet (100 mg total) by mouth daily.  Marland Kitchen oxyCODONE-acetaminophen (PERCOCET/ROXICET) 5-325 MG tablet   . pravastatin (PRAVACHOL) 20 MG tablet Take 1 tablet (20 mg total) by mouth at bedtime. (Patient taking differently: Take 10 mg by mouth at bedtime. )  . TRULICITY 6.64 QI/3.4VQ SOPN INJECT 0.5 MLS INTO THE SKIN ONCE A WEEK.  . [DISCONTINUED] JENTADUETO 2.08-998 MG TABS TAKE 1 TABLET BY MOUTH 2 TIMES DAILY. NEEDS OFFICE VISIT.  . [DISCONTINUED] Vitamin D, Ergocalciferol, (DRISDOL) 1.25 MG (50000 UT) CAPS capsule TAKE 1 CAPSULE ON WED AND SUN WEEKLY    Allergies:  Allergies  Allergen Reactions  . Keflet [Cephalexin]  Rash  . Penicillins Rash  . Sulfa Antibiotics Rash     Review of Systems:  A fourteen system review of systems was performed and found to be positive as per HPI.   Objective:   Blood pressure 121/87, pulse 97, temperature 97.6 F (36.4 C), temperature source Oral, resp. rate 12, height '6\' 2"'  (1.88 m), weight (!) 343 lb 9.6 oz (155.9 kg), SpO2 96 %. Body mass index is 44.12 kg/m. General:  Well Developed, well nourished, appropriate for stated age.  Neuro:  Alert and oriented,  extra-ocular muscles intact  HEENT:  Normocephalic, atraumatic, neck supple, no carotid bruits appreciated  Skin:  no gross rash, warm, pink. Cardiac:  RRR, S1 S2 Respiratory:  ECTA B/L and A/P, Not using accessory muscles, speaking in full sentences- unlabored. Vascular:  Ext warm, no cyanosis  apprec.; cap RF less 2 sec. Psych:  No HI/SI, judgement and insight good, Euthymic mood. Full Affect.

## 2019-03-16 LAB — HEMOGLOBIN A1C
Est. average glucose Bld gHb Est-mCnc: 108 mg/dL
Hgb A1c MFr Bld: 5.4 % (ref 4.8–5.6)

## 2019-03-16 LAB — COMPREHENSIVE METABOLIC PANEL
ALT: 47 IU/L — ABNORMAL HIGH (ref 0–44)
AST: 51 IU/L — ABNORMAL HIGH (ref 0–40)
Albumin/Globulin Ratio: 1.3 (ref 1.2–2.2)
Albumin: 4.3 g/dL (ref 3.8–4.9)
Alkaline Phosphatase: 74 IU/L (ref 39–117)
BUN/Creatinine Ratio: 20 (ref 9–20)
BUN: 19 mg/dL (ref 6–24)
Bilirubin Total: 1.1 mg/dL (ref 0.0–1.2)
CO2: 23 mmol/L (ref 20–29)
Calcium: 9.5 mg/dL (ref 8.7–10.2)
Chloride: 101 mmol/L (ref 96–106)
Creatinine, Ser: 0.94 mg/dL (ref 0.76–1.27)
GFR calc Af Amer: 107 mL/min/{1.73_m2} (ref >59)
GFR calc non Af Amer: 93 mL/min/{1.73_m2} (ref >59)
Globulin, Total: 3.3 g/dL (ref 1.5–4.5)
Glucose: 119 mg/dL — ABNORMAL HIGH (ref 65–99)
Potassium: 4.2 mmol/L (ref 3.5–5.2)
Sodium: 140 mmol/L (ref 134–144)
Total Protein: 7.6 g/dL (ref 6.0–8.5)

## 2019-03-16 LAB — LIPID PANEL
Chol/HDL Ratio: 4.8 ratio (ref 0.0–5.0)
Cholesterol, Total: 163 mg/dL (ref 100–199)
HDL: 34 mg/dL — ABNORMAL LOW (ref >39)
LDL Chol Calc (NIH): 105 mg/dL — ABNORMAL HIGH (ref 0–99)
Triglycerides: 134 mg/dL (ref 0–149)
VLDL Cholesterol Cal: 24 mg/dL (ref 5–40)

## 2019-03-16 LAB — VITAMIN D 25 HYDROXY (VIT D DEFICIENCY, FRACTURES): Vit D, 25-Hydroxy: 64.4 ng/mL (ref 30.0–100.0)

## 2019-03-21 ENCOUNTER — Other Ambulatory Visit: Payer: Self-pay | Admitting: Family Medicine

## 2019-03-21 DIAGNOSIS — E1169 Type 2 diabetes mellitus with other specified complication: Secondary | ICD-10-CM

## 2019-03-21 MED ORDER — JENTADUETO 2.5-1000 MG PO TABS: ORAL_TABLET | ORAL | 2 refills | Status: DC

## 2019-03-31 ENCOUNTER — Other Ambulatory Visit: Payer: Self-pay | Admitting: Family Medicine

## 2019-03-31 DIAGNOSIS — E1169 Type 2 diabetes mellitus with other specified complication: Secondary | ICD-10-CM

## 2019-05-24 ENCOUNTER — Other Ambulatory Visit: Payer: Self-pay | Admitting: Family Medicine

## 2019-05-24 DIAGNOSIS — I152 Hypertension secondary to endocrine disorders: Secondary | ICD-10-CM

## 2019-05-24 DIAGNOSIS — E1159 Type 2 diabetes mellitus with other circulatory complications: Secondary | ICD-10-CM

## 2019-06-07 ENCOUNTER — Other Ambulatory Visit: Payer: Self-pay | Admitting: Family Medicine

## 2019-06-07 DIAGNOSIS — E1169 Type 2 diabetes mellitus with other specified complication: Secondary | ICD-10-CM

## 2019-06-15 ENCOUNTER — Ambulatory Visit (INDEPENDENT_AMBULATORY_CARE_PROVIDER_SITE_OTHER): Payer: 59 | Admitting: Family Medicine

## 2019-06-15 ENCOUNTER — Encounter: Payer: Self-pay | Admitting: Family Medicine

## 2019-06-15 ENCOUNTER — Other Ambulatory Visit: Payer: Self-pay

## 2019-06-15 VITALS — Ht 73.5 in | Wt 357.0 lb

## 2019-06-15 DIAGNOSIS — Z5329 Procedure and treatment not carried out because of patient's decision for other reasons: Secondary | ICD-10-CM | POA: Diagnosis not present

## 2019-06-15 DIAGNOSIS — Z91199 Patient's noncompliance with other medical treatment and regimen due to unspecified reason: Secondary | ICD-10-CM

## 2019-06-15 NOTE — Progress Notes (Signed)
-  Patient was worked up by [employer], CMA today with med history reviewed, medication list, allergies etc. etc.  However, he was not available to be reached via telephone for his subsequent telehealth visit with me  - Patient was called around 9:55 AM and answering machine was received.  Told patient we will call him back for another try around 11:15 AM this morning after other tele-vists for the morning have been done.  - Patient's answering machine was received for a second time during call at 11:15 AM today as advised prior.  - Advised patient on answering machine to call the clinic and re-schedule for a tele-health visit at his earliest convenience.  Reminded patient that his last OV occurred on 03/15/2019,  and his A1c was improving.  Reminded patient that prior to next OV, he will need to obtain an A1c measurement at home using an over the counter kit, or, if he has not done this OTC at home, we will need to return in-person to the clinic to have an A1c obtained 2 days prior to next appointment.

## 2019-06-18 ENCOUNTER — Other Ambulatory Visit: Payer: Self-pay | Admitting: Family Medicine

## 2019-06-18 DIAGNOSIS — E559 Vitamin D deficiency, unspecified: Secondary | ICD-10-CM

## 2019-06-22 ENCOUNTER — Other Ambulatory Visit: Payer: Self-pay | Admitting: Family Medicine

## 2019-06-22 DIAGNOSIS — Z91199 Patient's noncompliance with other medical treatment and regimen due to unspecified reason: Secondary | ICD-10-CM

## 2019-06-22 DIAGNOSIS — E1159 Type 2 diabetes mellitus with other circulatory complications: Secondary | ICD-10-CM

## 2019-06-22 DIAGNOSIS — Z9119 Patient's noncompliance with other medical treatment and regimen: Secondary | ICD-10-CM

## 2019-06-22 DIAGNOSIS — I152 Hypertension secondary to endocrine disorders: Secondary | ICD-10-CM

## 2019-06-22 DIAGNOSIS — E1169 Type 2 diabetes mellitus with other specified complication: Secondary | ICD-10-CM

## 2019-06-22 DIAGNOSIS — E559 Vitamin D deficiency, unspecified: Secondary | ICD-10-CM

## 2019-06-28 ENCOUNTER — Other Ambulatory Visit: Payer: Self-pay

## 2019-06-28 ENCOUNTER — Other Ambulatory Visit: Payer: 59

## 2019-06-28 ENCOUNTER — Other Ambulatory Visit: Payer: Self-pay | Admitting: Family Medicine

## 2019-06-28 DIAGNOSIS — E1169 Type 2 diabetes mellitus with other specified complication: Secondary | ICD-10-CM

## 2019-06-29 LAB — HEMOGLOBIN A1C
Est. average glucose Bld gHb Est-mCnc: 148 mg/dL
Hgb A1c MFr Bld: 6.8 % — ABNORMAL HIGH (ref 4.8–5.6)

## 2019-07-14 ENCOUNTER — Telehealth: Payer: Self-pay | Admitting: Family Medicine

## 2019-07-14 NOTE — Telephone Encounter (Signed)
Patient called states has had Lt side pain for several days, now pain radiating down to groin w/ nausea. Advised pt no available appt w/ provider today & that he maybe advised by med asst to go to nearest UC or ED. --informed him I am not clinical & that they would have to call him back @ [phone].  --Forwarding message to med asst .  --glh

## 2019-07-14 NOTE — Telephone Encounter (Signed)
Patient is aware to go to [facility] or ED per Dr. Raliegh Scarlet. AS< CMA

## 2019-07-18 ENCOUNTER — Encounter: Payer: Self-pay | Admitting: Family Medicine

## 2019-07-18 ENCOUNTER — Ambulatory Visit (INDEPENDENT_AMBULATORY_CARE_PROVIDER_SITE_OTHER): Payer: 59 | Admitting: Family Medicine

## 2019-07-18 ENCOUNTER — Other Ambulatory Visit: Payer: Self-pay

## 2019-07-18 VITALS — Ht 73.0 in | Wt 348.0 lb

## 2019-07-18 DIAGNOSIS — R809 Proteinuria, unspecified: Secondary | ICD-10-CM

## 2019-07-18 DIAGNOSIS — Z91199 Patient's noncompliance with other medical treatment and regimen due to unspecified reason: Secondary | ICD-10-CM

## 2019-07-18 DIAGNOSIS — E1169 Type 2 diabetes mellitus with other specified complication: Secondary | ICD-10-CM

## 2019-07-18 DIAGNOSIS — E782 Mixed hyperlipidemia: Secondary | ICD-10-CM

## 2019-07-18 DIAGNOSIS — E1129 Type 2 diabetes mellitus with other diabetic kidney complication: Secondary | ICD-10-CM

## 2019-07-18 DIAGNOSIS — E559 Vitamin D deficiency, unspecified: Secondary | ICD-10-CM

## 2019-07-18 DIAGNOSIS — E1159 Type 2 diabetes mellitus with other circulatory complications: Secondary | ICD-10-CM

## 2019-07-18 DIAGNOSIS — R748 Abnormal levels of other serum enzymes: Secondary | ICD-10-CM

## 2019-07-18 DIAGNOSIS — Z9119 Patient's noncompliance with other medical treatment and regimen: Secondary | ICD-10-CM

## 2019-07-18 DIAGNOSIS — I152 Hypertension secondary to endocrine disorders: Secondary | ICD-10-CM

## 2019-07-18 DIAGNOSIS — I10 Essential (primary) hypertension: Secondary | ICD-10-CM

## 2019-07-18 MED ORDER — PRAVASTATIN SODIUM 10 MG PO TABS: 10.0000 mg | ORAL_TABLET | Freq: Every day | ORAL | 0 refills | Status: DC

## 2019-07-18 MED ORDER — LISINOPRIL 20 MG PO TABS: 20.0000 mg | ORAL_TABLET | Freq: Every day | ORAL | 0 refills | Status: DC

## 2019-07-18 MED ORDER — METOPROLOL SUCCINATE ER 100 MG PO TB24: 100.0000 mg | ORAL_TABLET | Freq: Every day | ORAL | 0 refills | Status: DC

## 2019-07-18 MED ORDER — VITAMIN D (ERGOCALCIFEROL) 1.25 MG (50000 UNIT) PO CAPS: ORAL_CAPSULE | ORAL | 0 refills | Status: DC

## 2019-07-18 MED ORDER — TRULICITY 0.75 MG/0.5ML ~~LOC~~ SOAJ: 0.5000 mL | SUBCUTANEOUS | 1 refills | Status: DC

## 2019-07-18 NOTE — Progress Notes (Signed)
Telehealth office visit note for Mellody Dance, D.O- at Primary Care at [facility]   I connected with current patient today and verified that I am speaking with the correct person   . Location of the patient: Home . Location of the provider: Office - This visit type was conducted due to national recommendations for restrictions regarding the COVID-19 Pandemic (e.g. social distancing) in an effort to limit this patient's exposure and mitigate transmission in our community.    - No physical exam could be performed with this format, beyond that communicated to [place] by the patient/ family members as noted.   - Additionally my office staff/ schedulers were to discuss with the patient that there may be a monetary charge related to this service, depending on their medical insurance.  My understanding is that patient understood and consented to proceed.     _________________________________________________________________________________   History of Present Illness:  I, Toni Amend, am serving as Education administrator for [employer].   States today "I'm doing crappy."  He's been having some pains in his side, "right where my kidney is."  Says "I think I have a kidney stone."  He went to Urgent Care for this and was told to go to the hospital "just to make sure," "but [the doctor] feels like [a kidney stone is] what it is."  The patient has not gone to the hospital, because as he states, "I think I passed it, and I'm definitely feeling better."    HPI:  Hypertension:  Says he hasn't been very good at checking his blood pressure lately, but when he does check it, it runs around 126/88 on average.  - Patient reports good compliance with medication and/or lifestyle modification  - His denies acute concerns or problems related to treatment plan  - He denies new onset of: chest pain, exercise intolerance, shortness of breath, dizziness, visual changes, headache, lower extremity swelling or  claudication.   Last 3 blood pressure readings in our office are as follows: BP Readings from Last 3 Encounters:  03/15/19 121/87  01/12/19 (!) 157/65  12/05/18 (!) 132/92   Filed Weights   07/18/19 0900  Weight: (!) 348 lb (157.9 kg)     HPI:   Diabetes Mellitus:  Home glucose readings:  His fasting sugars were 121 yesterday morning.  His fasting sugars have been "around there," and sometimes 110-115.  Says "I've been cutting back on a lot of the sugar, and I don't drink a lot of sugared [drinks]."   - Patient reports good compliance with therapy plan: medication and/or lifestyle modification  - His denies acute concerns or problems related to treatment plan  - He denies new concerns.  Denies polyuria/polydipsia, hypo/ hyperglycemia symptoms.  Denies new onset of: chest pain, exercise intolerance, shortness of breath, dizziness, visual changes, headache, lower extremity swelling or claudication.   Last A1C in the office was:  Lab Results  Component Value Date   HGBA1C 6.8 (H) 06/28/2019   HGBA1C 5.4 03/15/2019   HGBA1C 5.6 12/05/2018   Lab Results  Component Value Date   LDLCALC 105 (H) 03/15/2019   CREATININE 0.94 03/15/2019   BP Readings from Last 3 Encounters:  03/15/19 121/87  01/12/19 (!) 157/65  12/05/18 (!) 132/92   Wt Readings from Last 3 Encounters:  07/18/19 (!) 348 lb (157.9 kg)  06/15/19 (!) 357 lb (161.9 kg)  03/15/19 (!) 343 lb 9.6 oz (155.9 kg)     HPI:  Hyperlipidemia:  54  y.o. male here for cholesterol follow-up.   - Patient reports good compliance with treatment plan of:  medication and/ or lifestyle management.    He continues taking a half tablet of Pravachol. -->   Which is all he can tolerate due to elevated liver enzymes  - Patient denies any acute concerns or problems with management plan   - He denies new onset of: myalgias, arthralgias, increased fatigue more than normal, chest pains, exercise intolerance, shortness of breath,  dizziness, visual changes, headache, lower extremity swelling or claudication.   Most recent cholesterol panel was:  Lab Results  Component Value Date   CHOL 163 03/15/2019   HDL 34 (L) 03/15/2019   LDLCALC 105 (H) 03/15/2019   TRIG 134 03/15/2019   CHOLHDL 4.8 03/15/2019   Hepatic Function Latest Ref Rng & Units 03/15/2019 12/01/2018 07/04/2018  Total Protein 6.0 - 8.5 g/dL 7.6 - 7.8  Albumin 3.8 - 4.9 g/dL 4.3 - 4.6  AST 0 - 40 IU/L 51(H) - 78(H)  ALT 0 - 44 IU/L 47(H) 67(H) 93(H)  Alk Phosphatase 39 - 117 IU/L 74 - 86  Total Bilirubin 0.0 - 1.2 mg/dL 1.1 - 0.4      GAD 7 : Generalized Anxiety Score 12/05/2018  Nervous, Anxious, on Edge 0  Control/stop worrying 0  Worry too much - different things 0  Trouble relaxing 0  Restless 0  Easily annoyed or irritable 0  Afraid - awful might happen 0  Total GAD 7 Score 0    Depression screen [facility] 2/9 07/18/2019 06/15/2019 03/15/2019 01/12/2019 12/05/2018  Decreased Interest 0 0 0 0 0  Down, Depressed, Hopeless 0 0 0 0 0  PHQ - 2 Score 0 0 0 0 0  Altered sleeping 0 0 0 1 0  Tired, decreased energy 0 0 0 1 0  Change in appetite 0 0 0 0 0  Feeling bad or failure about yourself  0 0 0 0 0  Trouble concentrating 0 0 0 0 0  Moving slowly or fidgety/restless 0 0 0 0 0  Suicidal thoughts 0 0 0 0 0  PHQ-9 Score 0 0 0 2 0  Difficult doing work/chores - - - Not difficult at all Not difficult at all  Some recent data might be hidden      Impression and Recommendations:     1. Type 2 diabetes mellitus with other specified complication, without long-term current use of insulin ([place])   2. Hypertension associated with diabetes ([place])   3. Mixed diabetic hyperlipidemia associated with type 2 diabetes mellitus ([facility])   4. Elevated liver enzymes   5. Morbid obesity ([place])-  BMI 46.2   6. Microalbuminuria due to type 2 diabetes mellitus ([place])   7. Vitamin D deficiency   8. Noncompliance-   not taking meds, checking BS or BP, no prudent diet or  exercise etc.       Type 2 DM w/ other specified complication, w/out long-term current use of insulin - A1c 6.8 last check two weeks ago, up from 5.4 four months ago.  - A1c most recently is at goal under 7.0. - Pt will continue current treatment regimen.  See med list.  - Counseled patient on pathophysiology of disease and discussed various treatment options, which always includes dietary and lifestyle modification as first line.     - Patient declines referral to diabetic nutritionist today.   - STRONGLY Encouraged patient to engage in lifestyle modification.  Importance of low carb, heart-healthy diet  discussed with patient in addition to regular aerobic exercise of 7mn 5d/week or more.   - Check FBS and 2 hours after the biggest meal of your day.  Keep log and bring in next OV for my review.    - Also told patient if you ever feel poorly, please check your blood pressure and blood sugar, as one or the other could be the cause of your symptoms.  - Pt reminded about need for yearly eye and foot exams.  Told patient to make appt.for diabetic eye exam, CMAs here will do foot exams  - We will continue to monitor and re-check as discussed.   Hypertension associated with Type 2 DM - Blood pressure currently is stable, at goal. - Patient will continue current treatment regimen.  See med list.  - Counseled patient on pathophysiology of disease and discussed various treatment options, which always includes dietary and lifestyle modification as first line.   - Lifestyle changes such as dash and heart healthy diets and engaging in a regular exercise program discussed extensively with patient.   - Ambulatory blood pressure monitoring encouraged at least 3 times weekly.  Keep log and bring in every office visit.  Reminded patient that if they ever feel poorly in any way, to check their blood pressure and pulse.  - Handouts provided at patient's desire and/or told to go [place] at the  [place] information  - We will continue to monitor.   Mixed Diabetic Hyperlipidemia - Last FLP obtained 4 months ago:  Triglycerides = 134, WNL. LDL = 105, elevated. HDL = 34, low.  - Cholesterol levels are suboptimally controlled.  - Discussed that due to patient's history of elevated liver enzymes, we cannot increase his dose of cholesterol medication at this time.  - For increased control of his cholesterol, encouraged patient to engage in prudent weight loss and lifestyle modifications.  - Prudent dietary changes such as low saturated & trans fat diets for hyperlipidemia and low carb diets for hypertriglyceridemia discussed with patient.    - Encouraged patient to follow AHA guidelines for regular exercise and also engage in weight loss   - We will continue to monitor.   BMI Counseling - Body mass index is 45.91 kg/m, Morbid Obesity Explained to patient what BMI refers to, and what it means medically.    Told patient to think about it as a "medical risk stratification measurement" and how increasing BMI is associated with increasing risk/ or worsening state of various diseases such as hypertension, hyperlipidemia, diabetes, premature OA, depression etc.  American Heart Association guidelines for healthy diet, basically Mediterranean diet, and exercise guidelines of 30 minutes 5 days per week or more discussed in detail.  - Strongly encouraged patient to engage in prudent weight loss.  - Encouraged patient to lose a pound of weight per week, for a goal of 12 lbs weight loss in 12 weeks.  If further assistance is desired, encouraged patient to look into a program such as Weight Watchers for guidance.  - Health counseling performed.  All questions answered.  - Additionally, when appropriate, discussion had with patient regarding our treatment plan, and their biases/concerns about that plan were used in my medical decision making today.     - The patient agreed with the plan and demonstrated an understanding of the instructions.   No barriers to understanding were identified.     - The patient was advised to call back or seek an in-person evaluation  if the symptoms worsen or if the condition fails to improve as anticipated.   Return for f/up DM, HTN, HLD, every 3 months.    Meds ordered this encounter  Medications  . pravastatin (PRAVACHOL) 10 MG tablet    Sig: Take 1 tablet (10 mg total) by mouth daily.    Dispense:  90 tablet    Refill:  0  . Dulaglutide (TRULICITY) 9.02 IO/9.7DZ SOPN    Sig: Inject 0.5 mLs into the skin once a week.    Dispense:  2 pen    Refill:  1  . metoprolol succinate (TOPROL-XL) 100 MG 24 hr tablet    Sig: Take 1 tablet (100 mg total) by mouth daily.    Dispense:  90 tablet    Refill:  0  . Vitamin D, Ergocalciferol, (DRISDOL) 1.25 MG (50000 UNIT) CAPS capsule    Sig: Take 1 capsule on Wednesday and Sunday weekly    Dispense:  27 capsule    Refill:  0  . lisinopril (ZESTRIL) 20 MG tablet    Sig: Take 1 tablet (20 mg total) by mouth daily.    Dispense:  90 tablet    Refill:  0    Medications Discontinued During This Encounter  Medication Reason  . pravastatin (PRAVACHOL) 20 MG tablet Dose change  . metoprolol succinate (TOPROL-XL) 100 MG 24 hr tablet Reorder  . lisinopril (ZESTRIL) 20 MG tablet Reorder  . TRULICITY 3.29 JM/4.2AS SOPN Reorder  . Vitamin D, Ergocalciferol, (DRISDOL) 1.25 MG (50000 UNIT) CAPS capsule Reorder      Time spent on visit including pre-visit chart review and post-visit care was 13+ minutes.   Note:  This note was prepared with assistance of Dragon voice recognition software. Occasional wrong-word or sound-a-like substitutions may have occurred due to the inherent limitations of voice recognition software.  The [place] was signed into law in 2016 which includes the topic of electronic health records.  This provides immediate access to  information in MyChart.  This includes consultation notes, operative notes, office notes, lab results and pathology reports.  If you have any questions about what you read please let us know at your next visit or call us at the office.  We are right here with you.  This document serves as a record of services personally performed by Mellody Dance, DO. It was created on her behalf by Toni Amend, a trained medical scribe. The creation of this record is based on the scribe's personal observations and the provider's statements to them.    The above documentation from Toni Amend, medical scribe, has been reviewed by Marjory Sneddon, D.O.    __________________________________________________________________________________     Patient Care Team    Relationship Specialty Notifications Start End  Mellody Dance, DO PCP - General Family Medicine  11/10/17      -Vitals obtained; medications/ allergies reconciled;  personal medical, social, Sx etc.histories were updated by CMA, reviewed by me and are reflected in chart   Patient Active Problem List   Diagnosis Date Noted  . Microalbuminuria due to type 2 diabetes mellitus ([place]) 09/10/2018  . Diabetes mellitus ([place]) 11/10/2017  . Hypertension associated with diabetes ([place]) 11/10/2017  . Mixed diabetic hyperlipidemia associated with type 2 diabetes mellitus ([place]) 11/10/2017  . Morbid obesity ([place])-  BMI 46.2 11/10/2017  . History of smoking - 75-pack-year history; quit 2007 09/10/2018  . Noncompliance 11/10/2017  . Family history of secondary lung cancer- father late 13s, heavy smoker 09/10/2018  .  Vitamin D deficiency 12/17/2017  . Elevated liver enzymes 12/05/2018  . Tooth abscess 11/17/2018     Current Meds  Medication Sig  . cyclobenzaprine (FLEXERIL) 10 MG tablet Take 1 tablet (10 mg total) by mouth 3 (three) times daily as needed for muscle spasms.  . Dulaglutide (TRULICITY) 3.33 LK/5.6YB SOPN Inject 0.5 mLs  into the skin once a week.  . linaGLIPtin-metFORMIN HCl (JENTADUETO) 2.08-998 MG TABS 1/2 tab BID  . lisinopril (ZESTRIL) 20 MG tablet Take 1 tablet (20 mg total) by mouth daily.  . metoprolol succinate (TOPROL-XL) 100 MG 24 hr tablet Take 1 tablet (100 mg total) by mouth daily.  . Vitamin D, Ergocalciferol, (DRISDOL) 1.25 MG (50000 UNIT) CAPS capsule Take 1 capsule on Wednesday and Sunday weekly  . [DISCONTINUED] lisinopril (ZESTRIL) 20 MG tablet TAKE 1 TABLET BY MOUTH EVERY DAY  . [DISCONTINUED] metoprolol succinate (TOPROL-XL) 100 MG 24 hr tablet Take 1 tablet (100 mg total) by mouth daily.  . [DISCONTINUED] pravastatin (PRAVACHOL) 20 MG tablet Take 1 tablet (20 mg total) by mouth at bedtime. (Patient taking differently: Take 10 mg by mouth at bedtime. )  . [DISCONTINUED] TRULICITY 6.38 LH/7.3SK SOPN INJECT 0.5 MLS INTO THE SKIN ONCE A WEEK.  . [DISCONTINUED] Vitamin D, Ergocalciferol, (DRISDOL) 1.25 MG (50000 UNIT) CAPS capsule TAKE 1 CAPSULE ON WED AND SUN WEEKLY     Allergies:  Allergies  Allergen Reactions  . Keflet [Cephalexin] Rash  . Penicillins Rash  . Sulfa Antibiotics Rash     ROS:  See above HPI for pertinent positives and negatives   Objective:   Height '6\' 1"'  (1.854 m), weight (!) 348 lb (157.9 kg).  (if some vitals are omitted, this means that patient was UNABLE to obtain them even though they were asked to get them prior to OV today.  They were asked to call us at their earliest convenience with these once obtained. ) General: A & O * 3; sounds in no acute distress; in usual state of health.  Skin: Pt confirms warm and dry extremities and pink fingertips HEENT: Pt confirms lips non-cyanotic Chest: Patient confirms normal chest excursion and movement Respiratory: speaking in full sentences, no conversational dyspnea; patient confirms no use of accessory muscles Psych: insight appears good, mood- appears full

## 2019-10-18 ENCOUNTER — Encounter: Payer: Self-pay | Admitting: Physician Assistant

## 2019-10-18 ENCOUNTER — Ambulatory Visit (INDEPENDENT_AMBULATORY_CARE_PROVIDER_SITE_OTHER): Payer: 59 | Admitting: Physician Assistant

## 2019-10-18 ENCOUNTER — Other Ambulatory Visit: Payer: Self-pay

## 2019-10-18 VITALS — BP 127/84 | HR 75 | Temp 98.2°F | Ht 73.0 in | Wt 362.4 lb

## 2019-10-18 DIAGNOSIS — E1169 Type 2 diabetes mellitus with other specified complication: Secondary | ICD-10-CM | POA: Diagnosis not present

## 2019-10-18 DIAGNOSIS — E1159 Type 2 diabetes mellitus with other circulatory complications: Secondary | ICD-10-CM | POA: Diagnosis not present

## 2019-10-18 DIAGNOSIS — Z713 Dietary counseling and surveillance: Secondary | ICD-10-CM

## 2019-10-18 DIAGNOSIS — I152 Hypertension secondary to endocrine disorders: Secondary | ICD-10-CM

## 2019-10-18 DIAGNOSIS — E782 Mixed hyperlipidemia: Secondary | ICD-10-CM

## 2019-10-18 DIAGNOSIS — I10 Essential (primary) hypertension: Secondary | ICD-10-CM

## 2019-10-18 LAB — POCT GLYCOSYLATED HEMOGLOBIN (HGB A1C): Hemoglobin A1C: 7.2 % — AB (ref 4.0–5.6)

## 2019-10-18 NOTE — Patient Instructions (Addendum)
Diabetes Mellitus and Nutrition, Adult When you have diabetes (diabetes mellitus), it is very important to have healthy eating habits because your blood sugar (glucose) levels are greatly affected by what you eat and drink. Eating healthy foods in the appropriate amounts, at about the same times every day, can help you:  Control your blood glucose.  Lower your risk of heart disease.  Improve your blood pressure.  Reach or maintain a healthy weight. Every person with diabetes is different, and each person has different needs for a meal plan. Your health care provider may recommend that you work with a diet and nutrition specialist (dietitian) to make a meal plan that is best for you. Your meal plan may vary depending on factors such as:  The calories you need.  The medicines you take.  Your weight.  Your blood glucose, blood pressure, and cholesterol levels.  Your activity level.  Other health conditions you have, such as heart or kidney disease. How do carbohydrates affect me? Carbohydrates, also called carbs, affect your blood glucose level more than any other type of food. Eating carbs naturally raises the amount of glucose in your blood. Carb counting is a method for keeping track of how many carbs you eat. Counting carbs is important to keep your blood glucose at a healthy level, especially if you use insulin or take certain oral diabetes medicines. It is important to know how many carbs you can safely have in each meal. This is different for every person. Your dietitian can help you calculate how many carbs you should have at each meal and for each snack. Foods that contain carbs include:  Bread, cereal, rice, pasta, and crackers.  Potatoes and corn.  Peas, beans, and lentils.  Milk and yogurt.  Fruit and juice.  Desserts, such as cakes, cookies, ice cream, and candy. How does alcohol affect me? Alcohol can cause a sudden decrease in blood glucose (hypoglycemia),  especially if you use insulin or take certain oral diabetes medicines. Hypoglycemia can be a life-threatening condition. Symptoms of hypoglycemia (sleepiness, dizziness, and confusion) are similar to symptoms of having too much alcohol. If your health care provider says that alcohol is safe for you, follow these guidelines:  Limit alcohol intake to no more than 1 drink per day for nonpregnant women and 2 drinks per day for men. One drink equals 12 oz of beer, 5 oz of wine, or 1 oz of hard liquor.  Do not drink on an empty stomach.  Keep yourself hydrated with water, diet soda, or unsweetened iced tea.  Keep in mind that regular soda, juice, and other mixers may contain a lot of sugar and must be counted as carbs. What are tips for following this plan?  Reading food labels  Start by checking the serving size on the "Nutrition Facts" label of packaged foods and drinks. The amount of calories, carbs, fats, and other nutrients listed on the label is based on one serving of the item. Many items contain more than one serving per package.  Check the total grams (g) of carbs in one serving. You can calculate the number of servings of carbs in one serving by dividing the total carbs by 15. For example, if a food has 30 g of total carbs, it would be equal to 2 servings of carbs.  Check the number of grams (g) of saturated and trans fats in one serving. Choose foods that have low or no amount of these fats.  Check the number of   milligrams (mg) of salt (sodium) in one serving. Most people should limit total sodium intake to less than 2,300 mg per day.  Always check the nutrition information of foods labeled as "low-fat" or "nonfat". These foods may be higher in added sugar or refined carbs and should be avoided.  Talk to your dietitian to identify your daily goals for nutrients listed on the label. Shopping  Avoid buying canned, premade, or processed foods. These foods tend to be high in fat, sodium,  and added sugar.  Shop around the outside edge of the grocery store. This includes fresh fruits and vegetables, bulk grains, fresh meats, and fresh dairy. Cooking  Use low-heat cooking methods, such as baking, instead of high-heat cooking methods like deep frying.  Cook using healthy oils, such as olive, canola, or sunflower oil.  Avoid cooking with butter, cream, or high-fat meats. Meal planning  Eat meals and snacks regularly, preferably at the same times every day. Avoid going long periods of time without eating.  Eat foods high in fiber, such as fresh fruits, vegetables, beans, and whole grains. Talk to your dietitian about how many servings of carbs you can eat at each meal.  Eat 4-6 ounces (oz) of lean protein each day, such as lean meat, chicken, fish, eggs, or tofu. One oz of lean protein is equal to: ? 1 oz of meat, chicken, or fish. ? 1 egg. ?  cup of tofu.  Eat some foods each day that contain healthy fats, such as avocado, nuts, seeds, and fish. Lifestyle  Check your blood glucose regularly.  Exercise regularly as told by your health care provider. This may include: ? 150 minutes of moderate-intensity or vigorous-intensity exercise each week. This could be brisk walking, biking, or water aerobics. ? Stretching and doing strength exercises, such as yoga or weightlifting, at least 2 times a week.  Take medicines as told by your health care provider.  Do not use any products that contain nicotine or tobacco, such as cigarettes and e-cigarettes. If you need help quitting, ask your health care provider.  Work with a Social worker or diabetes educator to identify strategies to manage stress and any emotional and social challenges. Questions to ask a health care provider  Do I need to meet with a diabetes educator?  Do I need to meet with a dietitian?  What number can I call if I have questions?  When are the best times to check my blood glucose? Where to find more  information:  American Diabetes Association: diabetes.org  Academy of Nutrition and Dietetics: www.eatright.[employer] of Diabetes and Digestive and Kidney Diseases (NIH): [employer] Summary  A healthy meal plan will help you control your blood glucose and maintain a healthy lifestyle.  Working with a diet and nutrition specialist (dietitian) can help you make a meal plan that is best for you.  Keep in mind that carbohydrates (carbs) and alcohol have immediate effects on your blood glucose levels. It is important to count carbs and to use alcohol carefully. This information is not intended to replace advice given to you by your health care provider. Make sure you discuss any questions you have with your health care provider. Document Revised: 03/26/2017 Document Reviewed: 05/18/2016 Elsevier Patient Education  [place] Modification Ideas for Weight Management  Weight management involves adopting a healthy lifestyle that includes a knowledge of nutrition and exercise, a positive attitude and the right kind of motivation. Internal motives such as better health,  increased energy, self-esteem and personal control increase your chances of lifelong weight management success.  Remember to have realistic goals and think long-term success. Believe in yourself and you can do it. The following information will give you ideas to help you meet your goals.  Control Your Home Environment  Eat only while sitting down at the kitchen or dining room table. Do not eat while watching television, reading, cooking, talking on the phone, standing at the refrigerator or working on the computer. Keep tempting foods out of the house -- don't buy them. Keep tempting foods out of sight. Have low-calorie foods ready to eat. Unless you are preparing a meal, stay out of the kitchen. Have healthy snacks at your disposal, such as small pieces of fruit, vegetables, canned fruit,  pretzels, low-fat string cheese and nonfat cottage cheese.  Control Your Work Environment  Do not eat at [employer] or keep tempting snacks at your desk. If you get hungry between meals, plan healthy snacks and bring them with you to work. During your breaks, go for a walk instead of eating. If you work around food, plan in advance the one item you will eat at mealtime. Make it inconvenient to nibble on food by chewing gum, sugarless candy or drinking water or another low-calorie beverage. Do not work through meals. Skipping meals slows down metabolism and may result in overeating at the next meal. If food is available for special occasions, either pick the healthiest item, nibble on low-fat snacks brought from home, don't have anything offered, choose one option and have a small amount, or have only a beverage.  Control Your Mealtime Environment  Serve your plate of food at the stove or kitchen counter. Do not put the serving dishes on the table. If you do put dishes on the table, remove them immediately when finished eating. Fill half of your plate with vegetables, a quarter with lean protein and a quarter with starch. Use smaller plates, bowls and glasses. A smaller portion will look large when it is in a little dish. Politely refuse second helpings. When fixing your plate, limit portions of food to one scoop/serving or less.   Daily Food Management  Replace eating with another activity that you will not associate with food. Wait 20 minutes before eating something you are craving. Drink a large glass of water or diet soda before eating. Always have a big glass or bottle of water to drink throughout the day. Avoid high-calorie add-ons such as cream with your coffee, butter, mayonnaise and salad dressings.  Shopping: Do not shop when hungry or tired. Shop from a list and avoid buying anything that is not on your list. If you must have tempting foods, buy individual-sized packages and  try to find a lower-calorie alternative. Don't taste test in the store. Read food labels. Compare products to help you make the healthiest choices.  Preparation: Chew a piece of gum while cooking meals. Use a quarter teaspoon if you taste test your food. Try to only fix what you are going to eat, leaving yourself no chance for seconds. If you have prepared more food than you need, portion it into individual containers and freeze or refrigerate immediately. Don't snack while cooking meals.  Eating: Eat slowly. Remember it takes about 20 minutes for your stomach to send a message to your brain that it is full. Don't let fake hunger make you think you need more. The ideal way to eat is to take a bite, put your  utensil down, take a sip of water, cut your next bite, take a bit, put your utensil down and so on. Do not cut your food all at one time. Cut only as needed. Take small bites and chew your food well. Stop eating for a minute or two at least once during a meal or snack. Take breaks to reflect and have conversation.  Cleanup and Leftovers: Label leftovers for a specific meal or snack. Freeze or refrigerate individual portions of leftovers. Do not clean up if you are still hungry.  Eating Out and Social Eating  Do not arrive hungry. Eat something light before the meal. Try to fill up on low-calorie foods, such as vegetables and fruit, and eat smaller portions of the high-calorie foods. Eat foods that you like, but choose small portions. If you want seconds, wait at least 20 minutes after you have eaten to see if you are actually hungry or if your eyes are bigger than your stomach. Limit alcoholic beverages. Try a soda water with a twist of lime. Do not skip other meals in the day to save room for the special event.  At Restaurants: Order  la carte rather than buffet style. Order some vegetables or a salad for an appetizer instead of eating bread. If you order a high-calorie dish,  share it with someone. Try an after-dinner mint with your coffee. If you do have dessert, share it with two or more people. Don't overeat because you do not want to waste food. Ask for a doggie bag to take extra food home. Tell the server to put half of your entree in a to go bag before the meal is served to you. Ask for salad dressing, gravy or high-fat sauces on the side. Dip the tip of your fork in the dressing before each bite. If bread is served, ask for only one piece. Try it plain without butter or oil. At [employer] where oil and vinegar is served with bread, use only a small amount of oil and a lot of vinegar for dipping.  At a [facility]: Offer to bring a dish, appetizer or dessert that is low in calories. Serve yourself small portions or tell the host that you only want a small amount. Stand or sit away from the snack table. Stay away from the kitchen or stay busy if you are near the food. Limit your alcohol intake.  At [employer] and Cafeterias: Cover most of your plate with lettuce and/or vegetables. Use a salad plate instead of a dinner plate. After eating, clear away your dishes before having coffee or tea.  Entertaining at Home: Explore low-fat, low-cholesterol cookbooks. Use single-serving foods like chicken breasts or hamburger patties. Prepare low-calorie appetizers and desserts.   Holidays: Keep tempting foods out of sight. Decorate the house without using food. Have low-calorie beverages and foods on hand for guests. Allow yourself one planned treat a day. Don't skip meals to save up for the holiday feast. Eat regular, planned meals.   Exercise Well  Make exercise a priority and a planned activity in the day. If possible, walk the entire or part of the distance to work. Get an exercise buddy. Go for a walk with a colleague during one of your breaks, go to the gym, run or take a walk with a friend, walk in the mall with a shopping companion. Park at  the end of the parking lot and walk to the store or office entrance. Always take the stairs all of  the way or at least part of the way to your floor. If you have a desk job, walk around the office frequently. Do leg lifts while sitting at your desk. Do something outside on the weekends like going for a hike or a bike ride.   Have a Healthy Attitude  Make health your weight management priority. Be realistic. Have a goal to achieve a healthier you, not necessarily the lowest weight or ideal weight based on calculations or tables. Focus on a healthy eating style, not on dieting. Dieting usually lasts for a short amount of time and rarely produces long-term success. Think long term. You are developing new healthy behaviors to follow next month, in a year and in a decade.    This information is for educational purposes only and is not intended to replace the advice of your doctor or health care provider. We encourage you to discuss with your doctor any questions or concerns you may have.        Guidelines for Losing Weight   We want weight loss that will last so you should lose 1-2 pounds a week.  THAT IS IT! Please pick THREE things a month to change. Once it is a habit check off the item. Then pick another three items off the list to become habits.  If you are already doing a habit on the list GREAT!  Cross that item off!  Don't drink your calories. Ie, alcohol, soda, fruit juice, and sweet tea.   Drink more water. Drink a glass when you feel hungry or before each meal.   Eat breakfast - Complex carb and protein (likeDannon light and fit yogurt, oatmeal, fruit, eggs, [place] bacon).  Measure your cereal.  Eat no more than one cup a day. (ie Kashi)  Eat an apple a day.  Add a vegetable a day.  Try a new vegetable a month.  Use Pam! Stop using oil or butter to cook.  Don't finish your plate or use smaller plates.  Share your dessert.  Eat sugar free Jello for dessert or  frozen grapes.  Don't eat 2-3 hours before bed.  Switch to whole wheat bread, pasta, and brown rice.  Make healthier choices when you eat out. No fries!  Pick baked chicken, NOT fried.  Don't forget to SLOW DOWN when you eat. It is not going anywhere.   Take the stairs.  Park far away in the parking lot  Lift soup cans (or weights) for 10 minutes while watching TV.  Walk at work for 10 minutes during break.  Walk outside 1 time a week with your friend, kids, dog, or significant other.  Start a walking group at church.  Walk the mall as much as you can tolerate.   Keep a food diary.  Weigh yourself daily.  Walk for 15 minutes 3 days per week.  Cook at home more often and eat out less. If life happens and you go back to old habits, it is okay.  Just start over. You can do it!  If you experience chest pain, get short of breath, or tired during the exercise, please stop immediately and inform your doctor.    Before you even begin to attack a weight-loss plan, it pays to remember this: You are not fat. You have fat. Losing weight isn't about blame or shame; it's simply another achievement to accomplish. Dieting is like any other skill--you have to buckle down and work at it. As long as you act in  a smart, reasonable way, you'll ultimately get where you want to be. Here are some weight loss pearls for you.   1. It's Not a Diet. It's a Lifestyle Thinking of a diet as something you're on and suffering through only for the short term doesn't work. To shed weight and keep it off, you need to make permanent changes to the way you eat. It's OK to indulge occasionally, of course, but if you cut calories temporarily and then revert to your old way of eating, you'll gain back the weight quicker than you can say yo-yo. Use it to lose it. Research shows that one of the best predictors of long-term weight loss is how many pounds you drop in the first month. For that reason, nutritionists  often suggest being stricter for the first two weeks of your new eating strategy to build momentum. Cut out added sugar and alcohol and avoid unrefined carbs. After that, figure out how you can reincorporate them in a way that's healthy and maintainable.  2. There's a Right Way to Exercise Working out burns calories and fat and boosts your metabolism by building muscle. But those trying to lose weight are notorious for overestimating the number of calories they burn and underestimating the amount they take in. Unfortunately, your system is biologically programmed to hold on to extra pounds and that means when you start exercising, your body senses the deficit and ramps up its hunger signals. If you're not diligent, you'll eat everything you burn and then some. Use it, to lose it. Cardio gets all the exercise glory, but strength and interval training are the real heroes. They help you build lean muscle, which in turn increases your metabolism and calorie-burning ability 3. Don't Overreact to Mild Hunger Some people have a hard time losing weight because of hunger anxiety. To them, being hungry is bad--something to be avoided at all costs--so they carry snacks with them and eat when they don't need to. Others eat because they're stressed out or bored. While you never want to get to the point of being ravenous (that's when bingeing is likely to happen), a hunger pang, a craving, or the fact that it's 3:00 p.m. should not send you racing for the vending machine or obsessing about the energy bar in your purse. Ideally, you should put off eating until your stomach is growling and it's difficult to concentrate.  Use it to lose it. When you feel the urge to eat, use the HALT method. Ask yourself, Am I really hungry? Or am I angry or anxious, lonely or bored, or tired? If you're still not certain, try the apple test. If you're truly hungry, an apple should seem delicious; if it doesn't, something else is going on. Or  you can try drinking water and making yourself busy, if you are still hungry try a healthy snack.  4. Not All Calories Are Created Equal The mechanics of weight loss are pretty simple: Take in fewer calories than you use for energy. But the kind of food you eat makes all the difference. Processed food that's high in saturated fat and refined starch or sugar can cause inflammation that disrupts the hormone signals that tell your brain you're full. The result: You eat a lot more.  Use it to lose it. Clean up your diet. Swap in whole, unprocessed foods, including vegetables, lean protein, and healthy fats that will fill you up and give you the biggest nutritional bang for your calorie buck. In a few  weeks, as your brain starts receiving regular hunger and fullness signals once again, you'll notice that you feel less hungry overall and naturally start cutting back on the amount you eat.  5. Protein, Produce, and Plant-Based Fats Are Your Weight-Loss Trinity Here's why eating the three Ps regularly will help you drop pounds. Protein fills you up. You need it to build lean muscle, which keeps your metabolism humming so that you can torch more fat. People in a weight-loss program who ate double the recommended daily allowance for protein (about 110 grams for a 150-pound woman) lost 70 percent of their weight from fat, while people who ate the RDA lost only about 40 percent, one study found. Produce is packed with filling fiber. "It's very difficult to consume too many calories if you're eating a lot of vegetables. Example: Three cups of broccoli is a lot of food, yet only 93 calories. (Fruit is another story. It can be easy to overeat and can contain a lot of calories from sugar, so be sure to monitor your intake.) Plant-based fats like olive oil and those in avocados and nuts are healthy and extra satiating.  Use it to lose it. Aim to incorporate each of the three Ps into every meal and snack. People who eat  protein throughout the day are able to keep weight off, according to a study in the [place] of Clinical Nutrition. In addition to meat, poultry and seafood, good sources are beans, lentils, eggs, tofu, and yogurt. As for fat, keep portion sizes in check by measuring out salad dressing, oil, and nut butters (shoot for one to two tablespoons). Finally, eat veggies or a little fruit at every meal. People who did that consumed 308 fewer calories but didn't feel any hungrier than when they didn't eat more produce.  7. How You Eat Is As Important As What You Eat In order for your brain to register that you're full, you need to focus on what you're eating. Sit down whenever you eat, preferably at a table. Turn off the TV or computer, put down your phone, and look at your food. Smell it. Chew slowly, and don't put another bite on your fork until you swallow. When women ate lunch this attentively, they consumed 30 percent less when snacking later than those who listened to an audiobook at lunchtime, according to a study in the [place] of Nutrition. 8. Weighing Yourself Really Works The scale provides the best evidence about whether your efforts are paying off. Seeing the numbers tick up or down or stagnate is motivation to keep going--or to rethink your approach. A 2015 study at [facility] found that daily weigh-ins helped people lose more weight, keep it off, and maintain that loss, even after two years. Use it to lose it. Step on the scale at the same time every day for the best results. If your weight shoots up several pounds from one weigh-in to the next, don't freak out. Eating a lot of salt the night before or having your period is the likely culprit. The number should return to normal in a day or two. It's a steady climb that you need to do something about. 9. Too Much Stress and Too Little Sleep Are Your Enemies When you're tired and frazzled, your body cranks up the production of  cortisol, the stress hormone that can cause carb cravings. Not getting enough sleep also boosts your levels of ghrelin, a hormone associated with hunger, while suppressing leptin, a hormone  that signals fullness and satiety. People on a diet who slept only five and a half hours a night for two weeks lost 55 percent less fat and were hungrier than those who slept eight and a half hours, according to a study in the [place]. Use it to lose it. Prioritize sleep, aiming for seven hours or more a night, which research shows helps lower stress. And make sure you're getting quality zzz's. If a snoring spouse or a fidgety cat wakes you up frequently throughout the night, you may end up getting the equivalent of just four hours of sleep, according to a study from [facility]. Keep pets out of the bedroom, and use a white-noise app to drown out snoring. 10. You Will Hit a plateau--And You Can Bust Through It As you slim down, your body releases much less leptin, the fullness hormone.  If you're not strength training, start right now. Building muscle can raise your metabolism to help you overcome a plateau. To keep your body challenged and burning calories, incorporate new moves and more intense intervals into your workouts or add another sweat session to your weekly routine. Alternatively, cut an extra 100 calories or so a day from your diet. Now that you've lost weight, your body simply doesn't need as much fuel.    Since food equals calories, in order to lose weight you must either eat fewer calories, exercise more to burn off calories with activity, or both. Food that is not used to fuel the body is stored as fat. A major component of losing weight is to make smarter food choices. Here's how:  1)   Limit non-nutritious foods, such as: Sugar, honey, syrups and candy Pastries, donuts, pies, cakes and cookies Soft drinks, sweetened juices and alcoholic beverages  2)  Cut down  on high-fat foods by: - Choosing poultry, fish or lean red meat - Choosing low-fat cooking methods, such as baking, broiling, steaming, grilling and boiling - Using low-fat or non-fat dairy products - Using vinaigrette, herbs, lemon or fat-free salad dressings - Avoiding fatty meats, such as bacon, sausage, franks, ribs and luncheon meats - Avoiding high-fat snacks like nuts, chips and chocolate - Avoiding fried foods - Using less butter, margarine, oil and mayonnaise - Avoiding high-fat gravies, cream sauces and cream-based soups  3) Eat a variety of foods, including: - Fruit and vegetables that are raw, steamed or baked - Whole grains, breads, cereal, rice and pasta - Dairy products, such as low-fat or non-fat milk or yogurt, low-fat cottage cheese and low-fat cheese - Protein-rich foods like chicken, [place], fish, lean meat and legumes, or beans  4) Change your eating habits by: - Eat three balanced meals a day to help control your hunger - Watch portion sizes and eat small servings of a variety of foods - Choose low-calorie snacks - Eat only when you are hungry and stop when you are satisfied - Eat slowly and try not to perform other tasks while eating - Find other activities to distract you from food, such as walking, taking up a hobby or being involved in the community - Include regular exercise in your daily routine ( minimum of 20 min of moderate-intensity exercise at least 5 days/week)  - Find a support group, if necessary, for emotional support in your weight loss journey           Easy ways to cut 100 calories   1. Eat your eggs with hot sauce OR salsa  instead of cheese.  Eggs are great for breakfast, but many people consider eggs and cheese to be BFFs. Instead of cheese--1 oz. of cheddar has 114 calories--top your eggs with hot sauce, which contains no calories and helps with satiety and metabolism. Salsa is also a great option!!  2. Top your toast, waffles or  pancakes with fresh berries instead of jelly or syrup. Half a cup of berries--fresh, frozen or thawed--has about 40 calories, compared with 2 tbsp. of maple syrup or jelly, which both have about 100 calories. The berries will also give you a good punch of fiber, which helps keep you full and satisfied and won't spike blood sugar quickly like the jelly or syrup. 3. Swap the non-fat latte for black coffee with a splash of half-and-half. Contrary to its name, that non-fat latte has 130 calories and a startling 19g of carbohydrates per 16 oz. serving. Replacing that 'light' drinkable dessert with a black coffee with a splash of half-and-half saves you more than 100 calories per 16 oz. serving. 4. Sprinkle salads with freeze-dried raspberries instead of dried cranberries. If you want a sweet addition to your nutritious salad, stay away from dried cranberries. They have a whopping 130 calories per  cup and 30g carbohydrates. Instead, sprinkle freeze-dried raspberries guilt-free and save more than 100 calories per  cup serving, adding 3g of belly-filling fiber. 5. Go for mustard in place of mayo on your sandwich. Mustard can add really nice flavor to any sandwich, and there are tons of varieties, from spicy to honey. A serving of mayo is 95 calories, versus 10 calories in a serving of mustard.  Or try an avocado mayo spread: You can find the recipe few click this link: https://www.californiaavocado.com/recipes/recipe-container/[place]-avocado-mayo 6. Choose a DIY salad dressing instead of the store-bought kind. Mix Dijon or whole grain mustard with low-fat Kefir or red wine vinegar and garlic. 7. Use hummus as a spread instead of a dip. Use hummus as a spread on a high-fiber cracker or tortilla with a sandwich and save on calories without sacrificing taste. 8. Pick just one salad "accessory." Salad isn't automatically a calorie winner. It's easy to over-accessorize with toppings. Instead of topping your  salad with nuts, avocado and cranberries (all three will clock in at 313 calories), just pick one. The next day, choose a different accessory, which will also keep your salad interesting. You don't wear all your jewelry every day, right? 9. Ditch the white pasta in favor of spaghetti squash. One cup of cooked spaghetti squash has about 40 calories, compared with traditional spaghetti, which comes with more than 200. Spaghetti squash is also nutrient-dense. It's a good source of fiber and Vitamins A and C, and it can be eaten just like you would eat pasta--with a great tomato sauce and [place] meatballs or with pesto, tofu and spinach, for example. 10. Dress up your chili, soups and stews with non-fat [place] yogurt instead of sour cream. Just a 'dollop' of sour cream can set you back 115 calories and a whopping 12g of fat--seven of which are of the artery-clogging variety. Added bonus: [place] yogurt is packed with muscle-building protein, calcium and B Vitamins. 11. Mash cauliflower instead of mashed potatoes. One cup of traditional mashed potatoes--in all their creamy goodness--has more than 200 calories, compared to mashed cauliflower, which you can typically eat for less than 100 calories per 1 cup serving. Cauliflower is a great source of the antioxidant indole-3-carbinol (I3C), which may help reduce the risk of some  cancers, like breast cancer. 12. Ditch the ice cream sundae in favor of a [place] yogurt parfait. Instead of a cup of ice cream or fro-yo for dessert, try 1 cup of nonfat Greek yogurt topped with fresh berries and a sprinkle of cacao nibs. Both toppings are packed with antioxidants, which can help reduce cellular inflammation and oxidative damage. And the comparison is a no-brainer: One cup of ice cream has about 275 calories; one cup of frozen yogurt has about 230; and a cup of Greek yogurt has just 130, plus twice the protein, so you're less likely to return to the freezer for a second  helping. 13. Put olive oil in a spray container instead of using it directly from the bottle. Each tablespoon of olive oil is 120 calories and 15g of fat. Use a mister instead of pouring it straight into the pan or onto a salad. This allows for portion control and will save you more than 100 calories. 14. When baking, substitute canned pumpkin for butter or oil. Canned pumpkin--not pumpkin pie mix--is loaded with Vitamin A, which is important for skin and eye health, as well as immunity. And the comparisons are pretty crazy:  cup of canned pumpkin has about 40 calories, compared to butter or oil, which has more than 800 calories. Yes, 800 calories. Applesauce and mashed banana can also serve as good substitutions for butter or oil, usually in a 1:1 ratio. 15. Top casseroles with high-fiber cereal instead of breadcrumbs. Breadcrumbs are typically made with white bread, while breakfast cereals contain 5-9g of fiber per serving. Not only will you save more than 150 calories per  cup serving, the swap will also keep you more full and you'll get a metabolism boost from the added fiber. 16. Snack on pistachios instead of macadamia nuts. Believe it or not, you get the same amount of calories from 35 pistachios (100 calories) as you would from only five macadamia nuts. 17. Chow down on kale chips rather than potato chips. This is my favorite 'don't knock it 'till you try it' swap. Kale chips are so easy to make at home, and you can spice them up with a little grated parmesan or chili powder. Plus, they're a mere fraction of the calories of potato chips, but with the same crunch factor we crave so often. 18. Add seltzer and some fruit slices to your cocktail instead of soda or fruit juice. One cup of soda or fruit juice can pack on as much as 140 calories. Instead, use seltzer and fruit slices. The fruit provides valuable phytochemicals, such as flavonoids and anthocyanins, which help to combat cancer and stave  off the aging process.

## 2019-10-18 NOTE — Progress Notes (Signed)
Established Patient Office Visit  Subjective:  Patient ID: Ricardo Wolf, male    DOB: [birth year]  Age: 54 y.o. MRN: [id number]  CC:  Chief Complaint  Patient presents with  . Diabetes  . Hypertension  . Hyperlipidemia    HPI Ricardo Wolf presents for follow-up on diabetes mellitus, hypertension, and hyperlipidemia.  Diabetes: Pt denies increased urination or thirst. Pt reports medication compliance. No hypoglycemic events. Not checking glucose at home. States he has been drinking sodas again which is most likely why his A1c has increased.   HTN: Pt denies chest pain, palpitations, dizziness or lower extremity swelling. Taking medication as directed without side effects. Doesn't check BP at home. Pt follows a low salt diet. Reports good hydration.   HLD: Pt taking medication as directed without issues. Denies side effects including myalgias and RUQ pain. States he has reduced fried foods. He does some walking with his job but verbalizes awareness to increase physical activity.    Past Medical History:  Diagnosis Date  . Diabetes ([place])   . High cholesterol   . Hypertension     Past Surgical History:  Procedure Laterality Date  . BACK SURGERY  2003  . BACK SURGERY  2011    Family History  Problem Relation Age of Onset  . Diabetes Mother   . Lung cancer Father   . Diabetes Sister     Social History   Socioeconomic History  . Marital status: Married    Spouse name: Not on file  . Number of children: Not on file  . Years of education: Not on file  . Highest education level: Not on file  Occupational History  . Not on file  Tobacco Use  . Smoking status: Former Smoker    Packs/day: 1.00    Types: Cigarettes    Quit date: 2007    Years since quitting: 14.5  . Smokeless tobacco: Never Used  Vaping Use  . Vaping Use: Never used  Substance and Sexual Activity  . Alcohol use: Yes    Alcohol/week: 2.0 standard drinks    Types: 2 Standard drinks or equivalent  per week  . Drug use: Never  . Sexual activity: Yes    Partners: Female    Birth control/protection: None  Other Topics Concern  . Not on file  Social History Narrative  . Not on file   Social Determinants of Health   Financial Resource Strain:   . Difficulty of Paying Living Expenses:   Food Insecurity:   . Worried About Charity fundraiser in the Last Year:   . Arboriculturist in the Last Year:   Transportation Needs:   . Film/video editor (Medical):   Marland Kitchen Lack of Transportation (Non-Medical):   Physical Activity:   . Days of Exercise per Week:   . Minutes of Exercise per Session:   Stress:   . Feeling of Stress :   Social Connections:   . Frequency of Communication with Friends and Family:   . Frequency of Social Gatherings with Friends and Family:   . Attends Religious Services:   . Active Member of Clubs or Organizations:   . Attends Archivist Meetings:   Marland Kitchen Marital Status:   Intimate Partner Violence:   . Fear of Current or Ex-Partner:   . Emotionally Abused:   Marland Kitchen Physically Abused:   . Sexually Abused:     Outpatient Medications Prior to Visit  Medication Sig Dispense Refill  . cyclobenzaprine (FLEXERIL)  10 MG tablet Take 1 tablet (10 mg total) by mouth 3 (three) times daily as needed for muscle spasms. 30 tablet 0  . linaGLIPtin-metFORMIN HCl (JENTADUETO) 2.08-998 MG TABS 1/2 tab BID 90 tablet 2  . lisinopril (ZESTRIL) 20 MG tablet Take 1 tablet (20 mg total) by mouth daily. 90 tablet 0  . metoprolol succinate (TOPROL-XL) 100 MG 24 hr tablet Take 1 tablet (100 mg total) by mouth daily. 90 tablet 0  . pravastatin (PRAVACHOL) 10 MG tablet Take 1 tablet (10 mg total) by mouth daily. 90 tablet 0  . Vitamin D, Ergocalciferol, (DRISDOL) 1.25 MG (50000 UNIT) CAPS capsule Take 1 capsule on Wednesday and Sunday weekly 27 capsule 0  . Dulaglutide (TRULICITY) 6.06 TK/1.6WF SOPN Inject 0.5 mLs into the skin once a week. 2 pen 1  . ibuprofen (ADVIL) 800 MG  tablet  (Patient not taking: Reported on 10/18/2019)    . oxyCODONE-acetaminophen (PERCOCET/ROXICET) 5-325 MG tablet  (Patient not taking: Reported on 10/18/2019)     No facility-administered medications prior to visit.    Allergies  Allergen Reactions  . Keflet [Cephalexin] Rash  . Penicillins Rash  . Sulfa Antibiotics Rash    ROS Review of Systems Review of Systems: General:   Denies fever, chills, unexplained weight loss.  Optho/Auditory:   Denies visual changes, blurred vision Respiratory:   Denies SOB, DOE more than baseline levels.  Cardiovascular:   Denies chest pain, palpitations, new onset peripheral edema  Gastrointestinal:   Denies nausea, vomiting, diarrhea.  Genitourinary: Denies dysuria, freq/ urgency, flank pain  Endocrine:     Denies hot or cold intolerance, polyuria, polydipsia. Musculoskeletal:   Denies unexplained myalgias, joint swelling, unexplained arthralgias, gait problems.  Skin:  Denies rash, suspicious lesions Neurological:     Denies dizziness, unexplained weakness, numbness  Psychiatric/Behavioral: Denies mood changes, suicidal or homicidal ideations, hallucinations    Objective:    Physical Exam General:  Well Developed, well nourished, in no acute distress.  Neuro:  Alert and oriented,  extra-ocular muscles intact  HEENT:  Normocephalic, atraumatic, neck supple Skin:  no gross rash, warm, pink. Cardiac:  RRR, S1 S2 Respiratory:  ECTA B/L, Not using accessory muscles, speaking in full sentences- unlabored. Vascular:  Ext warm, no cyanosis apprec.; cap RF less 2 sec. No edema noted. Psych:  No HI/SI, judgement and insight good, Euthymic mood. Full Affect.   BP 127/84   Pulse 75   Temp 98.2 F (36.8 C) (Oral)   Ht 6\' 1"  (1.854 m)   Wt (!) 362 lb 6.4 oz (164.4 kg)   SpO2 94%   BMI 47.81 kg/m  Wt Readings from Last 3 Encounters:  10/18/19 (!) 362 lb 6.4 oz (164.4 kg)  07/18/19 (!) 348 lb (157.9 kg)  06/15/19 (!) 357 lb (161.9 kg)      Health Maintenance Due  Topic Date Due  . Hepatitis C Screening  Never done  . PNEUMOCOCCAL POLYSACCHARIDE VACCINE AGE 9-64 HIGH RISK  Never done  . OPHTHALMOLOGY EXAM  Never done  . COVID-19 Vaccine (1) Never done  . FOOT EXAM  12/18/2018    There are no preventive care reminders to display for this patient.  Lab Results  Component Value Date   TSH 3.040 07/04/2018   Lab Results  Component Value Date   WBC 6.5 07/04/2018   HGB 15.6 07/04/2018   HCT 45.2 07/04/2018   MCV 87 07/04/2018   PLT 215 07/04/2018   Lab Results  Component Value Date  NA 140 03/15/2019   K 4.2 03/15/2019   CO2 23 03/15/2019   GLUCOSE 119 (H) 03/15/2019   BUN 19 03/15/2019   CREATININE 0.94 03/15/2019   BILITOT 1.1 03/15/2019   ALKPHOS 74 03/15/2019   AST 51 (H) 03/15/2019   ALT 47 (H) 03/15/2019   PROT 7.6 03/15/2019   ALBUMIN 4.3 03/15/2019   CALCIUM 9.5 03/15/2019   Lab Results  Component Value Date   CHOL 163 03/15/2019   Lab Results  Component Value Date   HDL 34 (L) 03/15/2019   Lab Results  Component Value Date   LDLCALC 105 (H) 03/15/2019   Lab Results  Component Value Date   TRIG 134 03/15/2019   Lab Results  Component Value Date   CHOLHDL 4.8 03/15/2019   Lab Results  Component Value Date   HGBA1C 7.2 (A) 10/18/2019      Assessment & Plan:   Problem List Items Addressed This Visit      Cardiovascular and Mediastinum   Hypertension associated with diabetes ([place]) (Chronic)     Endocrine   Diabetes mellitus ([place]) - Primary (Chronic)   Relevant Orders   POCT glycosylated hemoglobin (Hb A1C) (Completed)   Mixed diabetic hyperlipidemia associated with type 2 diabetes mellitus ([facility]) (Chronic)     Other   Morbid obesity ([facility])-  BMI 46.2 (Chronic)    Other Visit Diagnoses    Weight loss counseling, encounter for         Diabetes Mellitus: - A1c today is 7.2, increased from 4 months ago -Discussed with patient the importance of controlling diabetes  to help reduce risk of complications and recommended to improve diet by reducing glucose (sodas). If A1c remains above goal or continues to increase next office visit will make medication adjustments. Patient verbalized understanding and is agreeable. - Continue Trulicity and Jentadueto. - Encourage ambulatory glucose monitoring and notify clinic if FBS consistently <80 or >160. - Follow diet and stay as active as possible.  HTN: - BP today is 127/84 HR 75, at goal. - Continue lisinopril 20 mg and metoprolol 100 mg. - Recommend ambulatory BP and pulse monitoring and keep a log. - Continue DASH diet. - Stay well hydrated, at least 64 fl oz  Mixed diabetic hyperlipidemia associated with type 2 DM: -Last lipid panel: Total cholesterol 163, triglycerides 134, HDL low at 34, LDL mildly elevated at 105 -Continue pravastatin 10 mg. -Follow heart healthy diet.  Morbid obesity, Weight loss counseling: -Recommend to increase physical activity level such as walking for 30 minutes 3-4 times per week with ultimate goal of 150 minutes/week. -Recommend to continue to improve diet by reducing cholesterol, saturated and trans fat as well as carbohydrates and glucose. -Advise 5% of weight loss.  Body mass index is 47.81 kg/m.   No orders of the defined types were placed in this encounter.   Follow-up: Return in about 3 months (around 01/18/2020) for DM, HTN, HLD, Wt and FBW (lipid panel, cmp, a1c).    Lorrene Reid, PA-C

## 2019-10-24 ENCOUNTER — Telehealth: Payer: Self-pay | Admitting: Physician Assistant

## 2019-10-24 ENCOUNTER — Other Ambulatory Visit: Payer: Self-pay | Admitting: Family Medicine

## 2019-10-24 ENCOUNTER — Other Ambulatory Visit: Payer: Self-pay | Admitting: Physician Assistant

## 2019-10-24 DIAGNOSIS — E1169 Type 2 diabetes mellitus with other specified complication: Secondary | ICD-10-CM

## 2019-10-24 MED ORDER — TRULICITY 0.75 MG/0.5ML ~~LOC~~ SOAJ: 0.7500 mg | SUBCUTANEOUS | 1 refills | Status: DC

## 2019-10-24 NOTE — Telephone Encounter (Signed)
Patient called while office clsd for lunch left msg requesting someone call him back--- No reason given .  -CB pt back no answer, left him responding message.  --FYI to staff.  --Ricardo Wolf

## 2019-10-24 NOTE — Telephone Encounter (Signed)
Pt called back states he needs a refill on :   Dulaglutide (TRULICITY) 9.79 NR/0.4HJ SOPN [[id number]]   Order Details Dose: 0.5 mL Route: Subcutaneous Frequency: Weekly  Dispense Quantity: 2 pen Refills: 1       Sig: Inject 0.5 mLs into the skin once a week.       --- Request to med asst  ( Pt's last OV 10/18/19 w / Herb Grays   --- pt uses :   [id number] - [place], [place] - [address] Phone:  [phone]  Fax:  [phone]     --glh

## 2019-11-16 ENCOUNTER — Telehealth: Payer: Self-pay | Admitting: Physician Assistant

## 2019-11-16 DIAGNOSIS — I152 Hypertension secondary to endocrine disorders: Secondary | ICD-10-CM

## 2019-11-16 DIAGNOSIS — E559 Vitamin D deficiency, unspecified: Secondary | ICD-10-CM

## 2019-11-16 MED ORDER — LISINOPRIL 20 MG PO TABS: 20.0000 mg | ORAL_TABLET | Freq: Every day | ORAL | 0 refills | Status: DC

## 2019-11-16 MED ORDER — METOPROLOL SUCCINATE ER 100 MG PO TB24: 100.0000 mg | ORAL_TABLET | Freq: Every day | ORAL | 0 refills | Status: DC

## 2019-11-16 MED ORDER — VITAMIN D (ERGOCALCIFEROL) 1.25 MG (50000 UNIT) PO CAPS: ORAL_CAPSULE | ORAL | 0 refills | Status: DC

## 2019-11-16 MED ORDER — PRAVASTATIN SODIUM 10 MG PO TABS: 10.0000 mg | ORAL_TABLET | Freq: Every day | ORAL | 0 refills | Status: DC

## 2019-11-16 NOTE — Telephone Encounter (Signed)
Refill sent to requested pharmacy. AS, CMA 

## 2019-11-16 NOTE — Addendum Note (Signed)
Addended by: Mickel Crow on: 11/16/2019 09:35 AM   Modules accepted: Orders

## 2019-11-16 NOTE — Telephone Encounter (Signed)
Patient is requesting a refill of his lisinopril, metoprolol, pravastatin, and Vit D. If approved [facility] to [facility] [place] on [address].

## 2019-12-13 ENCOUNTER — Other Ambulatory Visit: Payer: Self-pay | Admitting: Physician Assistant

## 2019-12-13 DIAGNOSIS — E1169 Type 2 diabetes mellitus with other specified complication: Secondary | ICD-10-CM

## 2020-01-14 ENCOUNTER — Other Ambulatory Visit: Payer: Self-pay | Admitting: Physician Assistant

## 2020-01-14 DIAGNOSIS — E1169 Type 2 diabetes mellitus with other specified complication: Secondary | ICD-10-CM

## 2020-01-23 ENCOUNTER — Other Ambulatory Visit: Payer: Self-pay

## 2020-01-23 ENCOUNTER — Ambulatory Visit (INDEPENDENT_AMBULATORY_CARE_PROVIDER_SITE_OTHER): Payer: 59 | Admitting: Physician Assistant

## 2020-01-23 ENCOUNTER — Encounter: Payer: Self-pay | Admitting: Physician Assistant

## 2020-01-23 VITALS — BP 135/84 | HR 91 | Ht 73.0 in | Wt 364.8 lb

## 2020-01-23 DIAGNOSIS — E1159 Type 2 diabetes mellitus with other circulatory complications: Secondary | ICD-10-CM | POA: Diagnosis not present

## 2020-01-23 DIAGNOSIS — E1129 Type 2 diabetes mellitus with other diabetic kidney complication: Secondary | ICD-10-CM

## 2020-01-23 DIAGNOSIS — E1169 Type 2 diabetes mellitus with other specified complication: Secondary | ICD-10-CM

## 2020-01-23 DIAGNOSIS — I152 Hypertension secondary to endocrine disorders: Secondary | ICD-10-CM

## 2020-01-23 DIAGNOSIS — R809 Proteinuria, unspecified: Secondary | ICD-10-CM

## 2020-01-23 DIAGNOSIS — M2550 Pain in unspecified joint: Secondary | ICD-10-CM

## 2020-01-23 DIAGNOSIS — I10 Essential (primary) hypertension: Secondary | ICD-10-CM

## 2020-01-23 DIAGNOSIS — E782 Mixed hyperlipidemia: Secondary | ICD-10-CM

## 2020-01-23 LAB — POCT UA - MICROALBUMIN
Albumin/Creatinine Ratio, Urine, POC: <30
Creatinine, POC: 300 mg/dL
Microalbumin Ur, POC: 30 mg/L

## 2020-01-23 LAB — POCT GLYCOSYLATED HEMOGLOBIN (HGB A1C): Hemoglobin A1C: 7.7 % — AB (ref 4.0–5.6)

## 2020-01-23 MED ORDER — JENTADUETO 2.5-1000 MG PO TABS: ORAL_TABLET | ORAL | 1 refills | Status: DC

## 2020-01-23 NOTE — Assessment & Plan Note (Signed)
-  Last lipid panel: LDL 105, HDL 34 -Continue Pravastatin 10 mg -Follow a heart healthy diet, increase vegetables, and reduce saturated and trans fats. -Will recheck lipid panel and hepatic function today.

## 2020-01-23 NOTE — Assessment & Plan Note (Signed)
-  Stable -UA microalbumin today normal, A:C <30 mg/g -Will continue to monitor.

## 2020-01-23 NOTE — Assessment & Plan Note (Addendum)
-  BP stable -Continue current medication regimen. -Follow a low sodium diet. -Will continue to monitor. 

## 2020-01-23 NOTE — Assessment & Plan Note (Signed)
-  A1c today increased from 7.2 to 7.7 so will make medication adjustment.  Patient reports in the past he did well with taking 1 tablet of Jentadueto 2.08-998 mg BID instead of 0.5 tablet BID, so will resume original dose and send new rx.  -Restart checking FBS and keep a log. -Dicussed with patient to work on reducing sugary drinks and monitor carbohydrates.  -Recommend to schedule diabetic eye exam, especially with recent vision changes. Pt verbalized understanding and plans to do schedule one soon. -Recommend to stay as active as possible. -Will continue to monitor.

## 2020-01-23 NOTE — Patient Instructions (Addendum)
Diabetes Mellitus and Nutrition, Adult When you have diabetes (diabetes mellitus), it is very important to have healthy eating habits because your blood sugar (glucose) levels are greatly affected by what you eat and drink. Eating healthy foods in the appropriate amounts, at about the same times every day, can help you:  Control your blood glucose.  Lower your risk of heart disease.  Improve your blood pressure.  Reach or maintain a healthy weight. Every person with diabetes is different, and each person has different needs for a meal plan. Your health care provider may recommend that you work with a diet and nutrition specialist (dietitian) to make a meal plan that is best for you. Your meal plan may vary depending on factors such as:  The calories you need.  The medicines you take.  Your weight.  Your blood glucose, blood pressure, and cholesterol levels.  Your activity level.  Other health conditions you have, such as heart or kidney disease. How do carbohydrates affect me? Carbohydrates, also called carbs, affect your blood glucose level more than any other type of food. Eating carbs naturally raises the amount of glucose in your blood. Carb counting is a method for keeping track of how many carbs you eat. Counting carbs is important to keep your blood glucose at a healthy level, especially if you use insulin or take certain oral diabetes medicines. It is important to know how many carbs you can safely have in each meal. This is different for every person. Your dietitian can help you calculate how many carbs you should have at each meal and for each snack. Foods that contain carbs include:  Bread, cereal, rice, pasta, and crackers.  Potatoes and corn.  Peas, beans, and lentils.  Milk and yogurt.  Fruit and juice.  Desserts, such as cakes, cookies, ice cream, and candy. How does alcohol affect me? Alcohol can cause a sudden decrease in blood glucose (hypoglycemia),  especially if you use insulin or take certain oral diabetes medicines. Hypoglycemia can be a life-threatening condition. Symptoms of hypoglycemia (sleepiness, dizziness, and confusion) are similar to symptoms of having too much alcohol. If your health care provider says that alcohol is safe for you, follow these guidelines:  Limit alcohol intake to no more than 1 drink per day for nonpregnant women and 2 drinks per day for men. One drink equals 12 oz of beer, 5 oz of wine, or 1 oz of hard liquor.  Do not drink on an empty stomach.  Keep yourself hydrated with water, diet soda, or unsweetened iced tea.  Keep in mind that regular soda, juice, and other mixers may contain a lot of sugar and must be counted as carbs. What are tips for following this plan?  Reading food labels  Start by checking the serving size on the "Nutrition Facts" label of packaged foods and drinks. The amount of calories, carbs, fats, and other nutrients listed on the label is based on one serving of the item. Many items contain more than one serving per package.  Check the total grams (g) of carbs in one serving. You can calculate the number of servings of carbs in one serving by dividing the total carbs by 15. For example, if a food has 30 g of total carbs, it would be equal to 2 servings of carbs.  Check the number of grams (g) of saturated and trans fats in one serving. Choose foods that have low or no amount of these fats.  Check the number of   milligrams (mg) of salt (sodium) in one serving. Most people should limit total sodium intake to less than 2,300 mg per day.  Always check the nutrition information of foods labeled as "low-fat" or "nonfat". These foods may be higher in added sugar or refined carbs and should be avoided.  Talk to your dietitian to identify your daily goals for nutrients listed on the label. Shopping  Avoid buying canned, premade, or processed foods. These foods tend to be high in fat, sodium,  and added sugar.  Shop around the outside edge of the grocery store. This includes fresh fruits and vegetables, bulk grains, fresh meats, and fresh dairy. Cooking  Use low-heat cooking methods, such as baking, instead of high-heat cooking methods like deep frying.  Cook using healthy oils, such as olive, canola, or sunflower oil.  Avoid cooking with butter, cream, or high-fat meats. Meal planning  Eat meals and snacks regularly, preferably at the same times every day. Avoid going long periods of time without eating.  Eat foods high in fiber, such as fresh fruits, vegetables, beans, and whole grains. Talk to your dietitian about how many servings of carbs you can eat at each meal.  Eat 4-6 ounces (oz) of lean protein each day, such as lean meat, chicken, fish, eggs, or tofu. One oz of lean protein is equal to: ? 1 oz of meat, chicken, or fish. ? 1 egg. ?  cup of tofu.  Eat some foods each day that contain healthy fats, such as avocado, nuts, seeds, and fish. Lifestyle  Check your blood glucose regularly.  Exercise regularly as told by your health care provider. This may include: ? 150 minutes of moderate-intensity or vigorous-intensity exercise each week. This could be brisk walking, biking, or water aerobics. ? Stretching and doing strength exercises, such as yoga or weightlifting, at least 2 times a week.  Take medicines as told by your health care provider.  Do not use any products that contain nicotine or tobacco, such as cigarettes and e-cigarettes. If you need help quitting, ask your health care provider.  Work with a Social worker or diabetes educator to identify strategies to manage stress and any emotional and social challenges. Questions to ask a health care provider  Do I need to meet with a diabetes educator?  Do I need to meet with a dietitian?  What number can I call if I have questions?  When are the best times to check my blood glucose? Where to find more  information:  American Diabetes Association: diabetes.org  Academy of Nutrition and Dietetics: www.eatright.[employer] of Diabetes and Digestive and Kidney Diseases (NIH): [employer] Summary  A healthy meal plan will help you control your blood glucose and maintain a healthy lifestyle.  Working with a diet and nutrition specialist (dietitian) can help you make a meal plan that is best for you.  Keep in mind that carbohydrates (carbs) and alcohol have immediate effects on your blood glucose levels. It is important to count carbs and to use alcohol carefully. This information is not intended to replace advice given to you by your health care provider. Make sure you discuss any questions you have with your health care provider. Document Revised: 03/26/2017 Document Reviewed: 05/18/2016 Elsevier Patient Education  2020 [place].   Osteoarthritis  Osteoarthritis is a type of arthritis that affects tissue that covers the ends of bones in joints (cartilage). Cartilage acts as a cushion between the bones and helps them move smoothly. Osteoarthritis results when cartilage  in the joints gets worn down. Osteoarthritis is sometimes called "wear and tear" arthritis. Osteoarthritis is the most common form of arthritis. It often occurs in older people. It is a condition that gets worse over time (a progressive condition). Joints that are most often affected by this condition are in:  Fingers.  Toes.  Hips.  Knees.  Spine, including neck and lower back. What are the causes? This condition is caused by age-related wearing down of cartilage that covers the ends of bones. What increases the risk? The following factors may make you more likely to develop this condition:  Older age.  Being overweight or obese.  Overuse of joints, such as in athletes.  Past injury of a joint.  Past surgery on a joint.  Family history of osteoarthritis. What are the signs or  symptoms? The main symptoms of this condition are pain, swelling, and stiffness in the joint. The joint may lose its shape over time. Small pieces of bone or cartilage may break off and float inside of the joint, which may cause more pain and damage to the joint. Small deposits of bone (osteophytes) may grow on the edges of the joint. Other symptoms may include:  A grating or scraping feeling inside the joint when you move it.  Popping or creaking sounds when you move. Symptoms may affect one or more joints. Osteoarthritis in a major joint, such as your knee or hip, can make it painful to walk or exercise. If you have osteoarthritis in your hands, you might not be able to grip items, twist your hand, or control small movements of your hands and fingers (fine motor skills). How is this diagnosed? This condition may be diagnosed based on:  Your medical history.  A physical exam.  Your symptoms.  X-rays of the affected joint(s).  Blood tests to rule out other types of arthritis. How is this treated? There is no cure for this condition, but treatment can help to control pain and improve joint function. Treatment plans may include:  A prescribed exercise program that allows for rest and joint relief. You may work with a physical therapist.  A weight control plan.  Pain relief techniques, such as: ? Applying heat and cold to the joint. ? Electric pulses delivered to nerve endings under the skin (transcutaneous electrical nerve stimulation, or TENS). ? Massage. ? Certain nutritional supplements.  NSAIDs or prescription medicines to help relieve pain.  Medicine to help relieve pain and inflammation (corticosteroids). This can be given by mouth (orally) or as an injection.  Assistive devices, such as a brace, wrap, splint, specialized glove, or cane.  Surgery, such as: ? An osteotomy. This is done to reposition the bones and relieve pain or to remove loose pieces of bone and  cartilage. ? Joint replacement surgery. You may need this surgery if you have very bad (advanced) osteoarthritis. Follow these instructions at home: Activity  Rest your affected joints as directed by your health care provider.  Do not drive or use heavy machinery while taking prescription pain medicine.  Exercise as directed. Your health care provider or physical therapist may recommend specific types of exercise, such as: ? Strengthening exercises. These are done to strengthen the muscles that support joints that are affected by arthritis. They can be performed with weights or with exercise bands to add resistance. ? Aerobic activities. These are exercises, such as brisk walking or water aerobics, that get your heart pumping. ? Range-of-motion activities. These keep your joints easy to  move. ? Balance and agility exercises. Managing pain, stiffness, and swelling      If directed, apply heat to the affected area as often as told by your health care provider. Use the heat source that your health care provider recommends, such as a moist heat pack or a heating pad. ? If you have a removable assistive device, remove it as told by your health care provider. ? Place a towel between your skin and the heat source. If your health care provider tells you to keep the assistive device on while you apply heat, place a towel between the assistive device and the heat source. ? Leave the heat on for 20-30 minutes. ? Remove the heat if your skin turns bright red. This is especially important if you are unable to feel pain, heat, or cold. You may have a greater risk of getting burned.  If directed, put ice on the affected joint: ? If you have a removable assistive device, remove it as told by your health care provider. ? Put ice in a plastic bag. ? Place a towel between your skin and the bag. If your health care provider tells you to keep the assistive device on during icing, place a towel between the  assistive device and the bag. ? Leave the ice on for 20 minutes, 2-3 times a day. General instructions  Take over-the-counter and prescription medicines only as told by your health care provider.  Maintain a healthy weight. Follow instructions from your health care provider for weight control. These may include dietary restrictions.  Do not use any products that contain nicotine or tobacco, such as cigarettes and e-cigarettes. These can delay bone healing. If you need help quitting, ask your health care provider.  Use assistive devices as directed by your health care provider.  Keep all follow-up visits as told by your health care provider. This is important. Where to find more information  [employer] of Arthritis and Musculoskeletal and Skin Diseases: www.niams.[employer].es  [employer] on Aging: http://kim-Siedschlag.com/  American College of Rheumatology: www.rheumatology.org Contact a health care provider if:  Your skin turns red.  You develop a rash.  You have pain that gets worse.  You have a fever along with joint or muscle aches. Get help right away if:  You lose a lot of weight.  You suddenly lose your appetite.  You have night sweats. Summary  Osteoarthritis is a type of arthritis that affects tissue covering the ends of bones in joints (cartilage).  This condition is caused by age-related wearing down of cartilage that covers the ends of bones.  The main symptom of this condition is pain, swelling, and stiffness in the joint.  There is no cure for this condition, but treatment can help to control pain and improve joint function. This information is not intended to replace advice given to you by your health care provider. Make sure you discuss any questions you have with your health care provider. Document Revised: 03/26/2017 Document Reviewed: 12/16/2015 Elsevier Patient Education  2020 [employer].

## 2020-01-23 NOTE — Progress Notes (Signed)
Established Patient Office Visit  Subjective:  Patient ID: Ricardo Wolf, male    DOB: [birth year]  Age: 53 y.o. MRN: [id number]  CC:  Chief Complaint  Patient presents with  . Diabetes  . Hypertension  . Hyperlipidemia    HPI Ricardo Wolf presents for follow up on diabetes mellitus, hypertension, and hyperlipidemia.  Diabetes: Pt denies increased urination or thirst. Pt reports medication compliance. No hypoglycemic events. Reports he was not checking his FBS because he thought he had lost his glucometer, but his wife was using his so will restart checking them again.  Reports he reduced his soda consumption but could do better, and needs to eventually completely stop drinking them. States he doesn't have a routine exercise regimen. By the time he gets home he is exhausted and has also been having joint pain.  Also reports he is having some vision issues where he is having trouble seeing up close.  HTN: Pt denies chest pain, palpitations, dizziness or lower extremity swelling. Taking medication as directed without side effects. Tries to watch sodium intake.  HLD: Pt taking medication as directed without issues. Denies side effects. States he doesn't monitor his diet for saturated and trans fats, but tries to make better options when eating out. He tries to limit fried foods. He doesn't eat much vegetables.   Hand pain, left leg pain: Reports he has noticed both hands locking up and left lower leg sometimes tends to lock up/stiffen. Symptoms usually happen towards the end of the day. No fever, eryhthema or chills.   Past Medical History:  Diagnosis Date  . Diabetes ([place])   . High cholesterol   . Hypertension     Past Surgical History:  Procedure Laterality Date  . BACK SURGERY  2003  . BACK SURGERY  2011    Family History  Problem Relation Age of Onset  . Diabetes Mother   . Lung cancer Father   . Diabetes Sister     Social History   Socioeconomic History  . Marital  status: Married    Spouse name: Not on file  . Number of children: Not on file  . Years of education: Not on file  . Highest education level: Not on file  Occupational History  . Not on file  Tobacco Use  . Smoking status: Former Smoker    Packs/day: 1.00    Types: Cigarettes    Quit date: 2007    Years since quitting: 14.7  . Smokeless tobacco: Never Used  Vaping Use  . Vaping Use: Never used  Substance and Sexual Activity  . Alcohol use: Yes    Alcohol/week: 2.0 standard drinks    Types: 2 Standard drinks or equivalent per week  . Drug use: Never  . Sexual activity: Yes    Partners: Female    Birth control/protection: None  Other Topics Concern  . Not on file  Social History Narrative  . Not on file   Social Determinants of Health   Financial Resource Strain:   . Difficulty of Paying Living Expenses: Not on file  Food Insecurity:   . Worried About Charity fundraiser in the Last Year: Not on file  . Ran Out of Food in the Last Year: Not on file  Transportation Needs:   . Lack of Transportation (Medical): Not on file  . Lack of Transportation (Non-Medical): Not on file  Physical Activity:   . Days of Exercise per Week: Not on file  . Minutes of Exercise  per Session: Not on file  Stress:   . Feeling of Stress : Not on file  Social Connections:   . Frequency of Communication with Friends and Family: Not on file  . Frequency of Social Gatherings with Friends and Family: Not on file  . Attends Religious Services: Not on file  . Active Member of Clubs or Organizations: Not on file  . Attends Archivist Meetings: Not on file  . Marital Status: Not on file  Intimate Partner Violence:   . Fear of Current or Ex-Partner: Not on file  . Emotionally Abused: Not on file  . Physically Abused: Not on file  . Sexually Abused: Not on file    Outpatient Medications Prior to Visit  Medication Sig Dispense Refill  . lisinopril (ZESTRIL) 20 MG tablet Take 1 tablet  (20 mg total) by mouth daily. 90 tablet 0  . metoprolol succinate (TOPROL-XL) 100 MG 24 hr tablet Take 1 tablet (100 mg total) by mouth daily. 90 tablet 0  . pravastatin (PRAVACHOL) 10 MG tablet Take 1 tablet (10 mg total) by mouth daily. 90 tablet 0  . TRULICITY 5.17 OH/6.0VP SOPN INJECT 0.5 MLS (0.75 MG TOTAL) INTO THE SKIN ONCE A WEEK. 0.5 mL 0  . Vitamin D, Ergocalciferol, (DRISDOL) 1.25 MG (50000 UNIT) CAPS capsule Take 1 capsule on Wednesday and Sunday weekly 27 capsule 0  . linaGLIPtin-metFORMIN HCl (JENTADUETO) 2.08-998 MG TABS 1/2 tab BID 90 tablet 2  . cyclobenzaprine (FLEXERIL) 10 MG tablet Take 1 tablet (10 mg total) by mouth 3 (three) times daily as needed for muscle spasms. (Patient not taking: Reported on 01/23/2020) 30 tablet 0  . ibuprofen (ADVIL) 800 MG tablet  (Patient not taking: Reported on 10/18/2019)    . oxyCODONE-acetaminophen (PERCOCET/ROXICET) 5-325 MG tablet  (Patient not taking: Reported on 10/18/2019)     No facility-administered medications prior to visit.    Allergies  Allergen Reactions  . Keflet [Cephalexin] Rash  . Penicillins Rash  . Sulfa Antibiotics Rash    ROS Review of Systems A fourteen system review of systems was performed and found to be positive as per HPI.  Objective:    Physical Exam General:  Well Developed, well nourished, appropriate for stated age.  Neuro:  Alert and oriented,  extra-ocular muscles intact, no focal deficits  HEENT:  Normocephalic, atraumatic, neck supple Skin:  no gross rash, warm, pink. Cardiac:  RRR, S1 S2 Respiratory:  ECTA B/L and A/P, Not using accessory muscles, speaking in full sentences- unlabored. Vascular:  Ext warm, no cyanosis apprec.; cap RF less 2 sec. Psych:  No HI/SI, judgement and insight good, Euthymic mood. Full Affect.   BP 135/84   Pulse 91   Ht '6\' 1"'  (1.854 m)   Wt (!) 364 lb 12.8 oz (165.5 kg)   SpO2 98%   BMI 48.13 kg/m  Wt Readings from Last 3 Encounters:  01/23/20 (!) 364 lb 12.8 oz  (165.5 kg)  10/18/19 (!) 362 lb 6.4 oz (164.4 kg)  07/18/19 (!) 348 lb (157.9 kg)     Health Maintenance Due  Topic Date Due  . Hepatitis C Screening  Never done  . PNEUMOCOCCAL POLYSACCHARIDE VACCINE AGE 42-64 HIGH RISK  Never done  . OPHTHALMOLOGY EXAM  Never done  . COVID-19 Vaccine (1) Never done  . TETANUS/TDAP  Never done  . COLONOSCOPY  Never done  . FOOT EXAM  12/18/2018  . INFLUENZA VACCINE  11/26/2019    There are no preventive care reminders  to display for this patient.  Lab Results  Component Value Date   TSH 3.040 07/04/2018   Lab Results  Component Value Date   WBC 6.5 07/04/2018   HGB 15.6 07/04/2018   HCT 45.2 07/04/2018   MCV 87 07/04/2018   PLT 215 07/04/2018   Lab Results  Component Value Date   NA 140 03/15/2019   K 4.2 03/15/2019   CO2 23 03/15/2019   GLUCOSE 119 (H) 03/15/2019   BUN 19 03/15/2019   CREATININE 0.94 03/15/2019   BILITOT 1.1 03/15/2019   ALKPHOS 74 03/15/2019   AST 51 (H) 03/15/2019   ALT 47 (H) 03/15/2019   PROT 7.6 03/15/2019   ALBUMIN 4.3 03/15/2019   CALCIUM 9.5 03/15/2019   Lab Results  Component Value Date   CHOL 163 03/15/2019   Lab Results  Component Value Date   HDL 34 (L) 03/15/2019   Lab Results  Component Value Date   LDLCALC 105 (H) 03/15/2019   Lab Results  Component Value Date   TRIG 134 03/15/2019   Lab Results  Component Value Date   CHOLHDL 4.8 03/15/2019   Lab Results  Component Value Date   HGBA1C 7.7 (A) 01/23/2020      Assessment & Plan:   Problem List Items Addressed This Visit      Cardiovascular and Mediastinum   Hypertension associated with diabetes ([place]) (Chronic)    -BP stable -Continue current medication regimen. -Follow a low sodium diet. -Will continue to monitor.       Relevant Medications   linaGLIPtin-metFORMIN HCl (JENTADUETO) 2.08-998 MG TABS     Endocrine   Diabetes mellitus ([place]) - Primary (Chronic)    -A1c today increased from 7.2 to 7.7 so will make  medication adjustment.  Patient reports in the past he did well with taking 1 tablet of Jentadueto 2.08-998 mg BID instead of 0.5 tablet BID, so will resume original dose and send new rx.  -Restart checking FBS and keep a log. -Dicussed with patient to work on reducing sugary drinks and monitor carbohydrates.  -Recommend to schedule diabetic eye exam, especially with recent vision changes. Pt verbalized understanding and plans to do schedule one soon. -Recommend to stay as active as possible. -Will continue to monitor.       Relevant Medications   linaGLIPtin-metFORMIN HCl (JENTADUETO) 2.08-998 MG TABS   Other Relevant Orders   POCT glycosylated hemoglobin (Hb A1C) (Completed)   POCT UA - Microalbumin (Completed)   Comp Met (CMET)   Mixed diabetic hyperlipidemia associated with type 2 diabetes mellitus ([facility]) (Chronic)    -Last lipid panel: LDL 105, HDL 34 -Continue Pravastatin 10 mg -Follow a heart healthy diet, increase vegetables, and reduce saturated and trans fats. -Will recheck lipid panel and hepatic function today.      Relevant Medications   linaGLIPtin-metFORMIN HCl (JENTADUETO) 2.08-998 MG TABS   Other Relevant Orders   Lipid Profile   Microalbuminuria due to type 2 diabetes mellitus ([facility]) (Chronic)    -Stable -UA microalbumin today normal, A:C <30 mg/g -Will continue to monitor.      Relevant Medications   linaGLIPtin-metFORMIN HCl (JENTADUETO) 2.08-998 MG TABS    Other Visit Diagnoses    Polyarthralgia         Polyarthralgia: -Discussed with patient symptoms likely related to osteoarthritis and recommend to apply topical anti-inflammatory such as Voltaren gel and take Ibuprofen with prudent use for severe pain. Last CMP-renal function wnl  -If symptoms worsen or fail to improve recommend  further evaluation such as imaging/labs.  Of note: Patient reports he is taking Gabapentin for neuropathy, which Dr. Raliegh Scarlet prescribed for him. Medication is not on med  list.  Meds ordered this encounter  Medications  . linaGLIPtin-metFORMIN HCl (JENTADUETO) 2.08-998 MG TABS    Sig: Take 1 tablet by mouth twice daily.    Dispense:  180 tablet    Refill:  1    Order Specific Question:   Supervising Provider    Answer:   Beatrice Lecher D [[id number]]    Follow-up: Return in about 3 months (around 04/23/2020) for DM HTN, HLD.   Note:  This note was prepared with assistance of Dragon voice recognition software. Occasional wrong-word or sound-a-like substitutions may have occurred due to the inherent limitations of voice recognition software.  Lorrene Reid, PA-C

## 2020-01-24 LAB — COMPREHENSIVE METABOLIC PANEL
ALT: 83 IU/L — ABNORMAL HIGH (ref 0–44)
AST: 58 IU/L — ABNORMAL HIGH (ref 0–40)
Albumin/Globulin Ratio: 1.4 (ref 1.2–2.2)
Albumin: 4.4 g/dL (ref 3.8–4.9)
Alkaline Phosphatase: 76 IU/L (ref 44–121)
BUN/Creatinine Ratio: 20 (ref 9–20)
BUN: 16 mg/dL (ref 6–24)
Bilirubin Total: 0.5 mg/dL (ref 0.0–1.2)
CO2: 22 mmol/L (ref 20–29)
Calcium: 9.4 mg/dL (ref 8.7–10.2)
Chloride: 100 mmol/L (ref 96–106)
Creatinine, Ser: 0.8 mg/dL (ref 0.76–1.27)
GFR calc Af Amer: 118 mL/min/{1.73_m2} (ref >59)
GFR calc non Af Amer: 102 mL/min/{1.73_m2} (ref >59)
Globulin, Total: 3.1 g/dL (ref 1.5–4.5)
Glucose: 170 mg/dL — ABNORMAL HIGH (ref 65–99)
Potassium: 4.7 mmol/L (ref 3.5–5.2)
Sodium: 137 mmol/L (ref 134–144)
Total Protein: 7.5 g/dL (ref 6.0–8.5)

## 2020-01-24 LAB — LIPID PANEL
Chol/HDL Ratio: 4.6 ratio (ref 0.0–5.0)
Cholesterol, Total: 199 mg/dL (ref 100–199)
HDL: 43 mg/dL (ref >39)
LDL Chol Calc (NIH): 126 mg/dL — ABNORMAL HIGH (ref 0–99)
Triglycerides: 167 mg/dL — ABNORMAL HIGH (ref 0–149)
VLDL Cholesterol Cal: 30 mg/dL (ref 5–40)

## 2020-02-12 ENCOUNTER — Other Ambulatory Visit: Payer: Self-pay | Admitting: Physician Assistant

## 2020-02-12 DIAGNOSIS — E1159 Type 2 diabetes mellitus with other circulatory complications: Secondary | ICD-10-CM

## 2020-02-12 DIAGNOSIS — E1169 Type 2 diabetes mellitus with other specified complication: Secondary | ICD-10-CM

## 2020-02-12 DIAGNOSIS — I152 Hypertension secondary to endocrine disorders: Secondary | ICD-10-CM

## 2020-05-06 ENCOUNTER — Encounter: Payer: Self-pay | Admitting: Physician Assistant

## 2020-05-06 ENCOUNTER — Other Ambulatory Visit: Payer: Self-pay

## 2020-05-06 ENCOUNTER — Ambulatory Visit (INDEPENDENT_AMBULATORY_CARE_PROVIDER_SITE_OTHER): Payer: 59 | Admitting: Physician Assistant

## 2020-05-06 VITALS — BP 134/86 | HR 79 | Ht 73.0 in | Wt 363.8 lb

## 2020-05-06 DIAGNOSIS — I152 Hypertension secondary to endocrine disorders: Secondary | ICD-10-CM

## 2020-05-06 DIAGNOSIS — M25511 Pain in right shoulder: Secondary | ICD-10-CM

## 2020-05-06 DIAGNOSIS — M25512 Pain in left shoulder: Secondary | ICD-10-CM

## 2020-05-06 DIAGNOSIS — E1159 Type 2 diabetes mellitus with other circulatory complications: Secondary | ICD-10-CM

## 2020-05-06 DIAGNOSIS — E782 Mixed hyperlipidemia: Secondary | ICD-10-CM

## 2020-05-06 DIAGNOSIS — E1169 Type 2 diabetes mellitus with other specified complication: Secondary | ICD-10-CM

## 2020-05-06 LAB — POCT GLYCOSYLATED HEMOGLOBIN (HGB A1C): Hemoglobin A1C: 7 % — AB (ref 4.0–5.6)

## 2020-05-06 MED ORDER — GLUCOSE BLOOD VI STRP: ORAL_STRIP | 12 refills | Status: AC

## 2020-05-06 MED ORDER — EZ SMART BLOOD GLUCOSE LANCETS MISC: 12 refills | Status: AC

## 2020-05-06 NOTE — Progress Notes (Signed)
Established Patient Office Visit  Subjective:  Patient ID: Ricardo Wolf, male    DOB: [birth year]  Age: 55 y.o. MRN: [id number]  CC:  Chief Complaint  Patient presents with  . Diabetes  . Hypertension  . Hyperlipidemia    HPI Ricardo Wolf presents for follow up on diabetes mellitus, hypertension and hyperlipidemia.  Complains of bilateral shoulder pain and back pain. Has been taking naproxen when needed for pain relief, which helps.  Diabetes: Pt denies increased urination or thirst. Pt reports medication compliance. No hypoglycemic events. Checking glucose at home. Brings glucometer and FBS range from 130s to 170s.  Patient reports has decreased sodas.  HTN: Pt denies chest pain, palpitations, dizziness or leg swelling. Taking medication as directed without side effects. Does not check BP at home. Pt tries to stay hydrated.   HLD: Pt taking medication as directed without issues. Reports diet consists of chicken, pork, and vegetables. Does not eat a lot of red meat.    Past Medical History:  Diagnosis Date  . Diabetes ([place])   . High cholesterol   . Hypertension     Past Surgical History:  Procedure Laterality Date  . BACK SURGERY  2003  . BACK SURGERY  2011    Family History  Problem Relation Age of Onset  . Diabetes Mother   . Lung cancer Father   . Diabetes Sister     Social History   Socioeconomic History  . Marital status: Married    Spouse name: Not on file  . Number of children: Not on file  . Years of education: Not on file  . Highest education level: Not on file  Occupational History  . Not on file  Tobacco Use  . Smoking status: Former Smoker    Packs/day: 1.00    Types: Cigarettes    Quit date: 2007    Years since quitting: 15.0  . Smokeless tobacco: Never Used  Vaping Use  . Vaping Use: Never used  Substance and Sexual Activity  . Alcohol use: Yes    Alcohol/week: 2.0 standard drinks    Types: 2 Standard drinks or equivalent per week  .  Drug use: Never  . Sexual activity: Yes    Partners: Female    Birth control/protection: None  Other Topics Concern  . Not on file  Social History Narrative  . Not on file   Social Determinants of Health   Financial Resource Strain: Not on file  Food Insecurity: Not on file  Transportation Needs: Not on file  Physical Activity: Not on file  Stress: Not on file  Social Connections: Not on file  Intimate Partner Violence: Not on file    Outpatient Medications Prior to Visit  Medication Sig Dispense Refill  . ibuprofen (ADVIL) 800 MG tablet     . linaGLIPtin-metFORMIN HCl (JENTADUETO) 2.08-998 MG TABS Take 1 tablet by mouth twice daily. 180 tablet 1  . lisinopril (ZESTRIL) 20 MG tablet TAKE 1 TABLET BY MOUTH EVERY DAY 90 tablet 0  . metoprolol succinate (TOPROL-XL) 100 MG 24 hr tablet TAKE 1 TABLET BY MOUTH EVERY DAY 90 tablet 0  . pravastatin (PRAVACHOL) 10 MG tablet TAKE 1 TABLET BY MOUTH EVERY DAY 90 tablet 0  . TRULICITY 8.24 MP/5.3IR SOPN INJECT 0.5 MLS (0.75 MG TOTAL) INTO THE SKIN ONCE A WEEK. 6 mL 0  . Vitamin D, Ergocalciferol, (DRISDOL) 1.25 MG (50000 UNIT) CAPS capsule Take 1 capsule on Wednesday and Sunday weekly 27 capsule 0  . cyclobenzaprine (  FLEXERIL) 10 MG tablet Take 1 tablet (10 mg total) by mouth 3 (three) times daily as needed for muscle spasms. 30 tablet 0  . oxyCODONE-acetaminophen (PERCOCET/ROXICET) 5-325 MG tablet  (Patient not taking: No sig reported)     No facility-administered medications prior to visit.    Allergies  Allergen Reactions  . Keflet [Cephalexin] Rash  . Penicillins Rash  . Sulfa Antibiotics Rash    ROS Review of Systems  A fourteen system review of systems was performed and found to be positive as per HPI.    Objective:    Physical Exam General:  Well Developed, well nourished, in no acute distress  Neuro:  Alert and oriented,  extra-ocular muscles intact  HEENT:  Normocephalic, atraumatic, neck supple Skin:  no gross rash,  warm, pink. Cardiac:  RRR, S1 S2 Respiratory:  ECTA B/L and A/P, Not using accessory muscles, speaking in full sentences- unlabored. MSK: Good ROM of UE bilaterally, no bony abnormality, mild TTP of bilateral biceps tendon, positive empty can test bilaterally, negative Hawkin's test Vascular:  Ext warm, no cyanosis apprec.; no gross edema Psych:  No HI/SI, judgement and insight good, Euthymic mood. Full Affect.   BP 134/86   Pulse 79   Ht 6\' 1"  (1.854 m)   Wt (!) 363 lb 12.8 oz (165 kg)   SpO2 95%   BMI 48.00 kg/m  Wt Readings from Last 3 Encounters:  05/06/20 (!) 363 lb 12.8 oz (165 kg)  01/23/20 (!) 364 lb 12.8 oz (165.5 kg)  10/18/19 (!) 362 lb 6.4 oz (164.4 kg)     Health Maintenance Due  Topic Date Due  . Hepatitis C Screening  Never done  . PNEUMOCOCCAL POLYSACCHARIDE VACCINE AGE 53-64 HIGH RISK  Never done  . OPHTHALMOLOGY EXAM  Never done  . COVID-19 Vaccine (1) Never done  . TETANUS/TDAP  Never done  . COLONOSCOPY (Pts 45-25yrs Insurance coverage will need to be confirmed)  Never done  . FOOT EXAM  12/18/2018  . INFLUENZA VACCINE  11/26/2019    There are no preventive care reminders to display for this patient.  Lab Results  Component Value Date   TSH 3.040 07/04/2018   Lab Results  Component Value Date   WBC 6.5 07/04/2018   HGB 15.6 07/04/2018   HCT 45.2 07/04/2018   MCV 87 07/04/2018   PLT 215 07/04/2018   Lab Results  Component Value Date   NA 137 01/23/2020   K 4.7 01/23/2020   CO2 22 01/23/2020   GLUCOSE 170 (H) 01/23/2020   BUN 16 01/23/2020   CREATININE 0.80 01/23/2020   BILITOT 0.5 01/23/2020   ALKPHOS 76 01/23/2020   AST 58 (H) 01/23/2020   ALT 83 (H) 01/23/2020   PROT 7.5 01/23/2020   ALBUMIN 4.4 01/23/2020   CALCIUM 9.4 01/23/2020   Lab Results  Component Value Date   CHOL 199 01/23/2020   Lab Results  Component Value Date   HDL 43 01/23/2020   Lab Results  Component Value Date   LDLCALC 126 (H) 01/23/2020   Lab Results   Component Value Date   TRIG 167 (H) 01/23/2020   Lab Results  Component Value Date   CHOLHDL 4.6 01/23/2020   Lab Results  Component Value Date   HGBA1C 7.0 (A) 05/06/2020      Assessment & Plan:   Problem List Items Addressed This Visit      Cardiovascular and Mediastinum   Hypertension associated with diabetes ([place]) (Chronic)    -  BP fairly controlled. -Continue current medication regimen. -Recommend to follow a low sodium diet and stay well hydrated. -Will continue to monitor.        Endocrine   Diabetes mellitus ([place]) - Primary (Chronic)    -A1c 7.0, has improved from 7.7 -Will continue current medication regimen and recommend to continue to reduce sweets and carbohydrates. -Continue ambulatory glucose monitoring. Provided refills for testing supplies. -Encourage to stay as active as possible. -Will continue to monitor.      Relevant Orders   POCT glycosylated hemoglobin (Hb A1C) (Completed)   Mixed diabetic hyperlipidemia associated with type 2 diabetes mellitus ([facility]) (Chronic)    -Last lipid panel: Total cholesterol 199, triglycerides 167, HDL 43, LDL 126 (above goal). -Discussed with patient possibly increasing pravastatin if lipid panel (LDL) remains above goal.  Will repeat lipid panel and hepatic function at next office visit and adjust medications as needed. -Recommend to follow a heart healthy diet and reduce saturated and transfats. -Will continue to monitor.       Other Visit Diagnoses    Bilateral shoulder pain, unspecified chronicity         Bilateral shoulder pain, unspecified chronicity: -Discussed with patient possibly shoulder tendinopathy so recommend to continue with naproxen as needed for pain relief. Provided shoulder exercises. If symptoms fail to improve or worsen recommend imaging studies with x-rays.   Meds ordered this encounter  Medications  . glucose blood test strip    Sig: Use as instructed    Dispense:  100 each    Refill:   12  . Ez Smart Blood Glucose Lancets MISC    Sig: Use to check fasting blood sugar    Dispense:  100 each    Refill:  12    Follow-up: Return in about 3 months (around 08/04/2020) for DM, HLD, HTN with FBW few days before.   Note:  This note was prepared with assistance of Dragon voice recognition software. Occasional wrong-word or sound-a-like substitutions may have occurred due to the inherent limitations of voice recognition software.  Lorrene Reid, PA-C

## 2020-05-06 NOTE — Assessment & Plan Note (Addendum)
-  A1c 7.0, has improved from 7.7 -Will continue current medication regimen and recommend to continue to reduce sweets and carbohydrates. -Continue ambulatory glucose monitoring. Provided refills for testing supplies. -Encourage to stay as active as possible. -Will continue to monitor.

## 2020-05-06 NOTE — Assessment & Plan Note (Signed)
-  BP fairly controlled. -Continue current medication regimen. -Recommend to follow a low sodium diet and stay well hydrated. -Will continue to monitor.

## 2020-05-06 NOTE — Patient Instructions (Addendum)
Heart-Healthy Eating Plan Many factors influence your heart (coronary) health, including eating and exercise habits. Coronary risk increases with abnormal blood fat (lipid) levels. Heart-healthy meal planning includes limiting unhealthy fats, increasing healthy fats, and making other diet and lifestyle changes. What is my plan? Your health care provider may recommend that you:  Limit your fat intake to _________% or less of your total calories each day.  Limit your saturated fat intake to _________% or less of your total calories each day.  Limit the amount of cholesterol in your diet to less than _________ mg per day. What are tips for following this plan? Cooking Cook foods using methods other than frying. Baking, boiling, grilling, and broiling are all good options. Other ways to reduce fat include:  Removing the skin from poultry.  Removing all visible fats from meats.  Steaming vegetables in water or broth. Meal planning  At meals, imagine dividing your plate into fourths: ? Fill one-half of your plate with vegetables and green salads. ? Fill one-fourth of your plate with whole grains. ? Fill one-fourth of your plate with lean protein foods.  Eat 4-5 servings of vegetables per day. One serving equals 1 cup raw or cooked vegetable, or 2 cups raw leafy greens.  Eat 4-5 servings of fruit per day. One serving equals 1 medium whole fruit,  cup dried fruit,  cup fresh, frozen, or canned fruit, or  cup 100% fruit juice.  Eat more foods that contain soluble fiber. Examples include apples, broccoli, carrots, beans, peas, and barley. Aim to get 25-30 g of fiber per day.  Increase your consumption of legumes, nuts, and seeds to 4-5 servings per week. One serving of dried beans or legumes equals  cup cooked, 1 serving of nuts is  cup, and 1 serving of seeds equals 1 tablespoon.   Fats  Choose healthy fats more often. Choose monounsaturated and polyunsaturated fats, such as olive and  canola oils, flaxseeds, walnuts, almonds, and seeds.  Eat more omega-3 fats. Choose salmon, mackerel, sardines, tuna, flaxseed oil, and ground flaxseeds. Aim to eat fish at least 2 times each week.  Check food labels carefully to identify foods with trans fats or high amounts of saturated fat.  Limit saturated fats. These are found in animal products, such as meats, butter, and cream. Plant sources of saturated fats include palm oil, palm kernel oil, and coconut oil.  Avoid foods with partially hydrogenated oils in them. These contain trans fats. Examples are stick margarine, some tub margarines, cookies, crackers, and other baked goods.  Avoid fried foods. General information  Eat more home-cooked food and less restaurant, buffet, and fast food.  Limit or avoid alcohol.  Limit foods that are high in starch and sugar.  Lose weight if you are overweight. Losing just 5-10% of your body weight can help your overall health and prevent diseases such as diabetes and heart disease.  Monitor your salt (sodium) intake, especially if you have high blood pressure. Talk with your health care provider about your sodium intake.  Try to incorporate more vegetarian meals weekly. What foods can I eat? Fruits All fresh, canned (in natural juice), or frozen fruits. Vegetables Fresh or frozen vegetables (raw, steamed, roasted, or grilled). Green salads. Grains Most grains. Choose whole wheat and whole grains most of the time. Rice and pasta, including brown rice and pastas made with whole wheat. Meats and other proteins Lean, well-trimmed beef, veal, pork, and lamb. Chicken and turkey without skin. All fish and shellfish.   Wild duck, rabbit, pheasant, and venison. Egg whites or low-cholesterol egg substitutes. Dried beans, peas, lentils, and tofu. Seeds and most nuts. Dairy Low-fat or nonfat cheeses, including ricotta and mozzarella. Skim or 1% milk (liquid, powdered, or evaporated). Buttermilk made  with low-fat milk. Nonfat or low-fat yogurt. Fats and oils Non-hydrogenated (trans-free) margarines. Vegetable oils, including soybean, sesame, sunflower, olive, peanut, safflower, corn, canola, and cottonseed. Salad dressings or mayonnaise made with a vegetable oil. Beverages Water (mineral or sparkling). Coffee and tea. Diet carbonated beverages. Sweets and desserts Sherbet, gelatin, and fruit ice. Small amounts of dark chocolate. Limit all sweets and desserts. Seasonings and condiments All seasonings and condiments. The items listed above may not be a complete list of foods and beverages you can eat. Contact a dietitian for more options. What foods are not recommended? Fruits Canned fruit in heavy syrup. Fruit in cream or butter sauce. Fried fruit. Limit coconut. Vegetables Vegetables cooked in cheese, cream, or butter sauce. Fried vegetables. Grains Breads made with saturated or trans fats, oils, or whole milk. Croissants. Sweet rolls. Donuts. High-fat crackers, such as cheese crackers. Meats and other proteins Fatty meats, such as hot dogs, ribs, sausage, bacon, rib-eye roast or steak. High-fat deli meats, such as salami and bologna. Caviar. Domestic duck and goose. Organ meats, such as liver. Dairy Cream, sour cream, cream cheese, and creamed cottage cheese. Whole milk cheeses. Whole or 2% milk (liquid, evaporated, or condensed). Whole buttermilk. Cream sauce or high-fat cheese sauce. Whole-milk yogurt. Fats and oils Meat fat, or shortening. Cocoa butter, hydrogenated oils, palm oil, coconut oil, palm kernel oil. Solid fats and shortenings, including bacon fat, salt pork, lard, and butter. Nondairy cream substitutes. Salad dressings with cheese or sour cream. Beverages Regular sodas and any drinks with added sugar. Sweets and desserts Frosting. Pudding. Cookies. Cakes. Pies. Milk chocolate or white chocolate. Buttered syrups. Full-fat ice cream or ice cream drinks. The items listed  above may not be a complete list of foods and beverages to avoid. Contact a dietitian for more information. Summary  Heart-healthy meal planning includes limiting unhealthy fats, increasing healthy fats, and making other diet and lifestyle changes.  Lose weight if you are overweight. Losing just 5-10% of your body weight can help your overall health and prevent diseases such as diabetes and heart disease.  Focus on eating a balance of foods, including fruits and vegetables, low-fat or nonfat dairy, lean protein, nuts and legumes, whole grains, and heart-healthy oils and fats. This information is not intended to replace advice given to you by your health care provider. Make sure you discuss any questions you have with your health care provider. Document Revised: 05/21/2017 Document Reviewed: 05/21/2017 Elsevier Patient Education  2021 [place].    Shoulder Impingement Syndrome  Shoulder impingement syndrome is a condition that causes pain when connective tissues (tendons) surrounding the shoulder joint become pinched. These tendons are part of the group of muscles and tissues that help to stabilize the shoulder (rotator cuff). Beneath the rotator cuff is a fluid-filled sac (bursa) that allows the muscles and tendons to glide smoothly. The bursa may become swollen or irritated (bursitis). Bursitis, swelling in the rotator cuff tendons, or both conditions can decrease how much space is under a bone in the shoulder joint (acromion), resulting in impingement. What are the causes? Shoulder impingement syndrome may be caused by bursitis or swelling of the rotator cuff tendons, which may result from:  Repetitive overhead arm movements.  Falling onto the shoulder.  Weakness  in the shoulder muscles. What increases the risk? You may be more likely to develop this condition if you:  Play sports that involve throwing, such as baseball.  Participate in sports such as tennis, volleyball, and  swimming.  Work as a Curator, Games developer, or Architect. Some people are also more likely to develop impingement syndrome because of the shape of their acromion bone. What are the signs or symptoms? The main symptom of this condition is pain on the front or side of the shoulder. The pain may:  Get worse when lifting or raising the arm.  Get worse at night.  Wake you up from sleeping.  Feel sharp when the shoulder is moved and then fade to an ache. Other symptoms may include:  Tenderness.  Stiffness.  Inability to raise the arm above shoulder level or behind the body.  Weakness. How is this diagnosed? This condition may be diagnosed based on:  Your symptoms and medical history.  A physical exam.  Imaging tests, such as: ? X-rays. ? MRI. ? Ultrasound. How is this treated? This condition may be treated by:  Resting your shoulder and avoiding all activities that cause pain or put stress on the shoulder.  Icing your shoulder.  NSAIDs to help reduce pain and swelling.  One or more injections of medicines to numb the area and reduce inflammation.  Physical therapy.  Surgery. This may be needed if nonsurgical treatments have not helped. Surgery may involve repairing the rotator cuff, reshaping the acromion, or removing the bursa. Follow these instructions at home: Managing pain, stiffness, and swelling  If directed, put ice on the injured area. ? Put ice in a plastic bag. ? Place a towel between your skin and the bag. ? Leave the ice on for 20 minutes, 2-3 times a day.   Activity  Rest and return to your normal activities as told by your health care provider. Ask your health care provider what activities are safe for you.  Do exercises as told by your health care provider. General instructions  Do not use any products that contain nicotine or tobacco, such as cigarettes, e-cigarettes, and chewing tobacco. These can delay healing. If you need help quitting, ask  your health care provider.  Ask your health care provider when it is safe for you to drive.  Take over-the-counter and prescription medicines only as told by your health care provider.  Keep all follow-up visits as told by your health care provider. This is important. How is this prevented?  Give your body time to rest between periods of activity.  Be safe and responsible while being active. This will help you avoid falls.  Maintain physical fitness, including strength and flexibility. Contact a health care provider if:  Your symptoms have not improved after 1-2 months of treatment and rest.  You cannot lift your arm away from your body. Summary  Shoulder impingement syndrome is a condition that causes pain when connective tissues (tendons) surrounding the shoulder joint become pinched.  The main symptom of this condition is pain on the front or side of the shoulder.  This condition is usually treated with rest, ice, and pain medicines as needed. This information is not intended to replace advice given to you by your health care provider. Make sure you discuss any questions you have with your health care provider. Document Revised: 08/05/2018 Document Reviewed: 10/06/2017 Elsevier Patient Education  2021 [place].   Shoulder Range of Motion Exercises Shoulder range of motion (ROM) exercises  are done to keep the shoulder moving freely or to increase movement. They are often recommended for people who have shoulder pain or stiffness or who are recovering from a shoulder surgery. Phase 1 exercises When you are able, do this exercise 1-2 times per day for 30-60 seconds in each direction, or as directed by your health care provider. Pendulum exercise To do this exercise while sitting: 1. Sit in a chair or at the edge of your bed with your feet flat on the floor. 2. Let your affected arm hang down in front of you over the edge of the bed or chair. 3. Relax your shoulder, arm,  and hand. [place] your body so your arm gently swings in small circles. You can also use your unaffected arm to start the motion. 5. Repeat changing the direction of the circles, swinging your arm left and right, and swinging your arm forward and back. To do this exercise while standing: 1. Stand next to a sturdy chair or table, and hold on to it with your hand on your unaffected side. 2. Bend forward at the waist. 3. Bend your knees slightly. 4. Relax your shoulder, arm, and hand. 5. While keeping your shoulder relaxed, use body motion to swing your arm in small circles. 6. Repeat changing the direction of the circles, swinging your arm left and right, and swinging your arm forward and back. 7. Between exercises, stand up tall and take a short break to relax your lower back.   Phase 2 exercises Do these exercises 1-2 times per day or as told by your health care provider. Hold each stretch for 30 seconds, and repeat 3 times. Do the exercises with one or both arms as instructed by your health care provider. For these exercises, sit at a table with your hand and arm supported by the table. A chair that slides easily or has wheels can be helpful. External rotation 1. Turn your chair so that your affected side is nearest to the table. 2. Place your forearm on the table to your side. Bend your elbow about 90 at the elbow (right angle) and place your hand palm facing down on the table. Your elbow should be about 6 inches away from your side. 3. Keeping your arm on the table, lean your body forward. Abduction 1. Turn your chair so that your affected side is nearest to the table. 2. Place your forearm and hand on the table so that your thumb points toward the ceiling and your arm is straight out to your side. 3. Slide your hand out to the side and away from you, using your unaffected arm to do the work. 4. To increase the stretch, you can slide your chair away from the table. Flexion: forward  stretch 1. Sit facing the table. Place your hand and elbow on the table in front of you. 2. Slide your hand forward and away from you, using your unaffected arm to do the work. 3. To increase the stretch, you can slide your chair backward. Phase 3 exercises Do these exercises 1-2 times per day or as told by your health care provider. Hold each stretch for 30 seconds, and repeat 3 times. Do the exercises with one or both arms as instructed by your health care provider. Cross-body stretch: posterior capsule stretch 1. Lift your arm straight out in front of you. 2. Bend your arm 90 at the elbow (right angle) so your forearm moves across your body. 3. Use your other arm  to gently pull the elbow across your body, toward your other shoulder. Wall climbs 1. Stand with your affected arm extended out to the side with your hand resting on a door frame. 2. Slide your hand slowly up the door frame. 3. To increase the stretch, step through the door frame. Keep your body upright and do not lean. Wand exercises You will need a cane, a piece of PVC pipe, or a sturdy wooden dowel for wand exercises. Flexion To do this exercise while standing: 1. Hold the wand with both of your hands, palms down. 2. Using the other arm to help, lift your arms up and over your head, if able. 3. Push upward with your other arm to gently increase the stretch. To do this exercise while lying down: 1. Lie on your back with your elbows resting on the floor and the wand in both your hands. Your hands will be palm down, or pointing toward your feet. 2. Lift your hands toward the ceiling, using your unaffected arm to help if needed. 3. Bring your arms overhead as able, using your unaffected arm to help if needed. Internal rotation 1. Stand while holding the wand behind you with both hands. Your unaffected arm should be extended above your head with the arm of the affected side extended behind you at the level of your waist. The wand  should be pointing straight up and down as you hold it. 2. Slowly pull the wand up behind your back by straightening the elbow of your unaffected arm and bending the elbow of your affected arm. External rotation 1. Lie on your back with your affected upper arm supported on a small pillow or rolled towel. When you first do this exercise, keep your upper arm close to your body. Over time, bring your arm up to a 90 angle out to the side. 2. Hold the wand across your stomach and with both hands palm up. Your elbow on your affected side should be bent at a 90 angle. 3. Use your unaffected side to help push your forearm away from you and toward the floor. Keep your elbow on your affected side bent at a 90 angle. Contact a health care provider if you have:  New or increasing pain.  New numbness, tingling, weakness, or discoloration in your arm or hand. This information is not intended to replace advice given to you by your health care provider. Make sure you discuss any questions you have with your health care provider. Document Revised: 05/26/2017 Document Reviewed: 05/26/2017 Elsevier Patient Education  2021 [employer].

## 2020-05-06 NOTE — Assessment & Plan Note (Addendum)
-  Last lipid panel: Total cholesterol 199, triglycerides 167, HDL 43, LDL 126 (above goal). -Discussed with patient possibly increasing pravastatin if lipid panel (LDL) remains above goal.  Will repeat lipid panel and hepatic function at next office visit and adjust medications as needed. -Recommend to follow a heart healthy diet and reduce saturated and transfats. -Will continue to monitor.

## 2020-05-17 ENCOUNTER — Other Ambulatory Visit: Payer: Self-pay | Admitting: Physician Assistant

## 2020-05-17 DIAGNOSIS — E1169 Type 2 diabetes mellitus with other specified complication: Secondary | ICD-10-CM

## 2020-05-17 DIAGNOSIS — I152 Hypertension secondary to endocrine disorders: Secondary | ICD-10-CM

## 2020-06-18 ENCOUNTER — Other Ambulatory Visit: Payer: Self-pay | Admitting: Physician Assistant

## 2020-06-18 DIAGNOSIS — E559 Vitamin D deficiency, unspecified: Secondary | ICD-10-CM

## 2020-07-25 ENCOUNTER — Other Ambulatory Visit: Payer: Self-pay | Admitting: Physician Assistant

## 2020-07-25 DIAGNOSIS — E1159 Type 2 diabetes mellitus with other circulatory complications: Secondary | ICD-10-CM

## 2020-07-25 DIAGNOSIS — I152 Hypertension secondary to endocrine disorders: Secondary | ICD-10-CM

## 2020-07-25 DIAGNOSIS — E1169 Type 2 diabetes mellitus with other specified complication: Secondary | ICD-10-CM

## 2020-07-25 DIAGNOSIS — E782 Mixed hyperlipidemia: Secondary | ICD-10-CM

## 2020-07-25 DIAGNOSIS — E559 Vitamin D deficiency, unspecified: Secondary | ICD-10-CM

## 2020-07-25 DIAGNOSIS — Z0000 Encounter for general adult medical examination without abnormal findings: Secondary | ICD-10-CM

## 2020-08-01 ENCOUNTER — Other Ambulatory Visit: Payer: 59

## 2020-08-01 ENCOUNTER — Other Ambulatory Visit: Payer: Self-pay

## 2020-08-01 DIAGNOSIS — E559 Vitamin D deficiency, unspecified: Secondary | ICD-10-CM

## 2020-08-01 DIAGNOSIS — Z0000 Encounter for general adult medical examination without abnormal findings: Secondary | ICD-10-CM

## 2020-08-01 DIAGNOSIS — E782 Mixed hyperlipidemia: Secondary | ICD-10-CM

## 2020-08-01 DIAGNOSIS — E1159 Type 2 diabetes mellitus with other circulatory complications: Secondary | ICD-10-CM

## 2020-08-01 DIAGNOSIS — I152 Hypertension secondary to endocrine disorders: Secondary | ICD-10-CM

## 2020-08-01 DIAGNOSIS — E1169 Type 2 diabetes mellitus with other specified complication: Secondary | ICD-10-CM

## 2020-08-02 LAB — COMPREHENSIVE METABOLIC PANEL
ALT: 95 IU/L — ABNORMAL HIGH (ref 0–44)
AST: 83 IU/L — ABNORMAL HIGH (ref 0–40)
Albumin/Globulin Ratio: 1.4 (ref 1.2–2.2)
Albumin: 4.2 g/dL (ref 3.8–4.9)
Alkaline Phosphatase: 69 IU/L (ref 44–121)
BUN/Creatinine Ratio: 19 (ref 9–20)
BUN: 16 mg/dL (ref 6–24)
Bilirubin Total: 0.6 mg/dL (ref 0.0–1.2)
CO2: 20 mmol/L (ref 20–29)
Calcium: 9.1 mg/dL (ref 8.7–10.2)
Chloride: 101 mmol/L (ref 96–106)
Creatinine, Ser: 0.85 mg/dL (ref 0.76–1.27)
Globulin, Total: 3.1 g/dL (ref 1.5–4.5)
Glucose: 151 mg/dL — ABNORMAL HIGH (ref 65–99)
Potassium: 4.6 mmol/L (ref 3.5–5.2)
Sodium: 138 mmol/L (ref 134–144)
Total Protein: 7.3 g/dL (ref 6.0–8.5)
eGFR: 103 mL/min/{1.73_m2} (ref >59)

## 2020-08-02 LAB — HEMOGLOBIN A1C
Est. average glucose Bld gHb Est-mCnc: 157 mg/dL
Hgb A1c MFr Bld: 7.1 % — ABNORMAL HIGH (ref 4.8–5.6)

## 2020-08-02 LAB — VITAMIN D 25 HYDROXY (VIT D DEFICIENCY, FRACTURES): Vit D, 25-Hydroxy: 54.8 ng/mL (ref 30.0–100.0)

## 2020-08-02 LAB — LIPID PANEL
Chol/HDL Ratio: 4.4 ratio (ref 0.0–5.0)
Cholesterol, Total: 192 mg/dL (ref 100–199)
HDL: 44 mg/dL (ref >39)
LDL Chol Calc (NIH): 124 mg/dL — ABNORMAL HIGH (ref 0–99)
Triglycerides: 132 mg/dL (ref 0–149)
VLDL Cholesterol Cal: 24 mg/dL (ref 5–40)

## 2020-08-02 LAB — TSH: TSH: 2.21 u[IU]/mL (ref 0.450–4.500)

## 2020-08-02 LAB — CBC
Hematocrit: 43.2 % (ref 37.5–51.0)
Hemoglobin: 14.7 g/dL (ref 13.0–17.7)
MCH: 29.4 pg (ref 26.6–33.0)
MCHC: 34 g/dL (ref 31.5–35.7)
MCV: 86 fL (ref 79–97)
Platelets: 127 10*3/uL — ABNORMAL LOW (ref 150–450)
RBC: 5 x10E6/uL (ref 4.14–5.80)
RDW: 13.3 % (ref 11.6–15.4)
WBC: 4.7 10*3/uL (ref 3.4–10.8)

## 2020-08-05 ENCOUNTER — Ambulatory Visit: Payer: 59 | Admitting: Physician Assistant

## 2020-08-07 ENCOUNTER — Encounter: Payer: Self-pay | Admitting: Physician Assistant

## 2020-08-07 ENCOUNTER — Other Ambulatory Visit: Payer: Self-pay

## 2020-08-07 ENCOUNTER — Ambulatory Visit (INDEPENDENT_AMBULATORY_CARE_PROVIDER_SITE_OTHER): Payer: 59 | Admitting: Physician Assistant

## 2020-08-07 VITALS — BP 128/79 | HR 96 | Temp 98.3°F | Ht 74.0 in | Wt 363.4 lb

## 2020-08-07 DIAGNOSIS — M2550 Pain in unspecified joint: Secondary | ICD-10-CM | POA: Diagnosis not present

## 2020-08-07 DIAGNOSIS — R252 Cramp and spasm: Secondary | ICD-10-CM | POA: Diagnosis not present

## 2020-08-07 DIAGNOSIS — E1159 Type 2 diabetes mellitus with other circulatory complications: Secondary | ICD-10-CM | POA: Diagnosis not present

## 2020-08-07 DIAGNOSIS — I152 Hypertension secondary to endocrine disorders: Secondary | ICD-10-CM

## 2020-08-07 DIAGNOSIS — E1169 Type 2 diabetes mellitus with other specified complication: Secondary | ICD-10-CM

## 2020-08-07 DIAGNOSIS — R748 Abnormal levels of other serum enzymes: Secondary | ICD-10-CM

## 2020-08-07 DIAGNOSIS — E782 Mixed hyperlipidemia: Secondary | ICD-10-CM

## 2020-08-07 LAB — POCT GLYCOSYLATED HEMOGLOBIN (HGB A1C): Hemoglobin A1C: 7.2 % — AB (ref 4.0–5.6)

## 2020-08-07 MED ORDER — PRAVASTATIN SODIUM 20 MG PO TABS: 20.0000 mg | ORAL_TABLET | Freq: Every day | ORAL | 0 refills | Status: DC

## 2020-08-07 MED ORDER — MELOXICAM 7.5 MG PO TABS: 7.5000 mg | ORAL_TABLET | Freq: Every day | ORAL | 0 refills | Status: DC | PRN

## 2020-08-07 NOTE — Progress Notes (Signed)
Established Patient Office Visit  Subjective:  Patient ID: Ricardo Wolf, male    DOB: 10-14-1965  Age: 55 y.o. MRN: [id number]  CC:  Chief Complaint  Patient presents with  . Diabetes  . Hypertension  . Hyperlipidemia    HPI Ricardo Wolf presents for follow up on diabetes mellitus, hypertension and hyperlipidemia. Patient reports is currently going to PT for left ankle sprain and knee pain which is worker's comp related (went to [facility] initially). Continues to complain of joint clean at multiple sites (elbows, knees, ankles, wrists) and joint stiffness usually at the end of the day.  Denies fever, unintentional weight loss or erythema.  States is taking gabapentin twice a day for pain relief.  Patient reports prescription is coming from our office.  Also reports left hand and left leg locking up like a muscle cramp once in a while, usually happens at bedtime.  Diabetes: Pt denies increased urination or thirst. Pt reports medication compliance. States sometimes feels like if his sugar is low because he has a headache. Checking glucose at home occasionally and FBS range from 102-108. Reports has been drinking sodas and sweets. Continues to drink to alcohol. Patient states binge drinks socially.  HTN: Pt denies chest pain, palpitations, dizziness or increased lower extremity swelling. Taking medication as directed without side effects. Not checking BP at home.   HLD: Pt reports has missed his medication the last 3 weeks. Went out-of-town and forgot his medication.    Past Medical History:  Diagnosis Date  . Diabetes ([place])   . High cholesterol   . Hypertension     Past Surgical History:  Procedure Laterality Date  . BACK SURGERY  2003  . BACK SURGERY  2011    Family History  Problem Relation Age of Onset  . Diabetes Mother   . Lung cancer Father   . Diabetes Sister     Social History   Socioeconomic History  . Marital status: Married    Spouse name: Not on file  .  Number of children: Not on file  . Years of education: Not on file  . Highest education level: Not on file  Occupational History  . Not on file  Tobacco Use  . Smoking status: Former Smoker    Packs/day: 1.00    Types: Cigarettes    Quit date: 2007    Years since quitting: 15.2  . Smokeless tobacco: Never Used  Vaping Use  . Vaping Use: Never used  Substance and Sexual Activity  . Alcohol use: Yes    Alcohol/week: 2.0 standard drinks    Types: 2 Standard drinks or equivalent per week  . Drug use: Never  . Sexual activity: Yes    Partners: Female    Birth control/protection: None  Other Topics Concern  . Not on file  Social History Narrative  . Not on file   Social Determinants of Health   Financial Resource Strain: Not on file  Food Insecurity: Not on file  Transportation Needs: Not on file  Physical Activity: Not on file  Stress: Not on file  Social Connections: Not on file  Intimate Partner Violence: Not on file    Outpatient Medications Prior to Visit  Medication Sig Dispense Refill  . Ez Smart Blood Glucose Lancets MISC Use to check fasting blood sugar 100 each 12  . glucose blood test strip Use as instructed 100 each 12  . ibuprofen (ADVIL) 800 MG tablet     . linaGLIPtin-metFORMIN HCl (JENTADUETO) 2.08-998  MG TABS Take 1 tablet by mouth twice daily. 180 tablet 1  . lisinopril (ZESTRIL) 20 MG tablet TAKE 1 TABLET BY MOUTH EVERY DAY 90 tablet 0  . metoprolol succinate (TOPROL-XL) 100 MG 24 hr tablet Take 1 tablet (100 mg total) by mouth daily. 90 tablet 0  . TRULICITY 7.61 YW/7.3XT SOPN INJECT 0.5 MLS (0.75 MG TOTAL) INTO THE SKIN ONCE A WEEK. 6 mL 2  . Vitamin D, Ergocalciferol, (DRISDOL) 1.25 MG (50000 UNIT) CAPS capsule TAKE 1 CAPSULE ON WEDNESDAY AND SUNDAY WEEKLY 9 capsule 2  . pravastatin (PRAVACHOL) 10 MG tablet TAKE 1 TABLET BY MOUTH EVERY DAY 90 tablet 0   No facility-administered medications prior to visit.    Allergies  Allergen Reactions  .  Keflet [Cephalexin] Rash  . Penicillins Rash  . Sulfa Antibiotics Rash    ROS Review of Systems A fourteen system review of systems was performed and found to be positive as per HPI.   Objective:    Physical Exam General:  Well Developed, well nourished, in no acute distress Neuro:  Alert and oriented,  extra-ocular muscles intact  HEENT:  Normocephalic, atraumatic, neck supple Skin:  no gross rash, warm, pink. Cardiac:  RRR, S1 S2 wnl's,  Respiratory:  ECTA B/L w/o wheezing, Not using accessory muscles, speaking in full sentences- unlabored. MSK: good ROM of UE and LE, no bony deformity noted, normal gait  Vascular:  Ext warm, no cyanosis apprec.; cap RF less 2 sec. Psych:  No HI/SI, judgement and insight good, Euthymic mood. Full Affect.  BP 128/79   Pulse 96   Temp 98.3 F (36.8 C)   Ht 6\' 2"  (1.88 m)   Wt (!) 363 lb 6.4 oz (164.8 kg)   SpO2 97%   BMI 46.66 kg/m  Wt Readings from Last 3 Encounters:  08/07/20 (!) 363 lb 6.4 oz (164.8 kg)  05/06/20 (!) 363 lb 12.8 oz (165 kg)  01/23/20 (!) 364 lb 12.8 oz (165.5 kg)     Health Maintenance Due  Topic Date Due  . Hepatitis C Screening  Never done  . PNEUMOCOCCAL POLYSACCHARIDE VACCINE AGE 62-64 HIGH RISK  Never done  . COVID-19 Vaccine (1) Never done  . OPHTHALMOLOGY EXAM  Never done  . TETANUS/TDAP  Never done  . COLONOSCOPY (Pts 45-32yrs Insurance coverage will need to be confirmed)  Never done  . FOOT EXAM  12/18/2018    There are no preventive care reminders to display for this patient.  Lab Results  Component Value Date   TSH 2.210 08/01/2020   Lab Results  Component Value Date   WBC 4.7 08/01/2020   HGB 14.7 08/01/2020   HCT 43.2 08/01/2020   MCV 86 08/01/2020   PLT 127 (L) 08/01/2020   Lab Results  Component Value Date   NA 138 08/01/2020   K 4.6 08/01/2020   CO2 20 08/01/2020   GLUCOSE 151 (H) 08/01/2020   BUN 16 08/01/2020   CREATININE 0.85 08/01/2020   BILITOT 0.6 08/01/2020   ALKPHOS 69  08/01/2020   AST 83 (H) 08/01/2020   ALT 95 (H) 08/01/2020   PROT 7.3 08/01/2020   ALBUMIN 4.2 08/01/2020   CALCIUM 9.1 08/01/2020   Lab Results  Component Value Date   CHOL 192 08/01/2020   Lab Results  Component Value Date   HDL 44 08/01/2020   Lab Results  Component Value Date   LDLCALC 124 (H) 08/01/2020   Lab Results  Component Value Date   TRIG  132 08/01/2020   Lab Results  Component Value Date   CHOLHDL 4.4 08/01/2020   Lab Results  Component Value Date   HGBA1C 7.2 (A) 08/07/2020      Assessment & Plan:   Problem List Items Addressed This Visit      Cardiovascular and Mediastinum   Hypertension associated with diabetes ([place]) (Chronic)   Relevant Medications   pravastatin (PRAVACHOL) 20 MG tablet     Endocrine   Diabetes mellitus ([facility]) - Primary (Chronic)   Relevant Medications   pravastatin (PRAVACHOL) 20 MG tablet   Other Relevant Orders   POCT glycosylated hemoglobin (Hb A1C) (Completed)   Mixed diabetic hyperlipidemia associated with type 2 diabetes mellitus ([facility]) (Chronic)   Relevant Medications   pravastatin (PRAVACHOL) 20 MG tablet     Other   Elevated liver enzymes    Other Visit Diagnoses    Polyarthralgia       Relevant Medications   meloxicam (MOBIC) 7.5 MG tablet   Other Relevant Orders   B12 and Folate Panel (Completed)   Magnesium (Completed)   Sedimentation rate (Completed)   C-reactive protein (Completed)   Antinuclear Antib (ANA) (Completed)   Rheumatoid Factor (Completed)   Muscle cramp, nocturnal       Relevant Orders   B12 and Folate Panel (Completed)   Magnesium (Completed)     Diabetes mellitus: -Most recent A1c stable, discussed with patient reducing sodas/alcohol and sugar intake to improve A1c. Goal is <7.0. -Continue current medication regimen. Recommend to monitor glucose daily. -Will continue to monitor.  Hypertension associated with diabetes: -Controlled. -Continue current medication  regimen. -Recommend to stay well-hydrated at least 64 fluid ounces and monitor sodium intake. -Recent CMP renal function electrolytes normal.  Mixed diabetic hyperlipidemia associated with type 2 diabetes mellitus: -Recent lipid panel: Total cholesterol 192, triglycerides 132, HDL 44, LDL 124 (goal <70). -Discussed with patient increasing pravastatin to 20 mg to help improve LDL.  Patient verbalized understanding and agreeable. Advised to let me know if unable to tolerate increased dose due to side effects. Will closely monitor liver function. -Recommend to reduce saturated and transfats. -Will continue to monitor,  will repeat lipid panel and hepatic function at f/up visit.  Elevated liver enzymes: -Recent AST 83, ALT 95, liver enzymes have increased from prior and likely related to alcohol consumption vs statin (pt has not been taking medication x 3 weeks).  -Discussed with patient reducing alcohol use, limiting to 2 drinks/day and/or 14 drinks/wk. -Recommend further evaluation with imaging studies (RUQ ultrasound) and labs, will further discuss at follow up visit.  -Will repeat hepatic function at follow up visit.  Polyarthralgia, nocturnal muscle cramp: -Discussed with patient potential etiologies and will place lab orders to evaluate for possible autoimmune cause or nutritional deficiency. Likely inflammatory arthralgia.  -Recommend adequate hydration. -Discussed with patient gabapentin not on medication list. Reviewed med history, last rx for Gabapentin 300 mg 01/12/2019 by Dr. Raliegh Scarlet. -Will trial meloxicam 7.5 mg to take as needed for pain relief. Recent renal function normal. Will repeat renal function at follow up visit. -If symptoms fail to improve or worsen recommend further evaluation with imaging studies and/or to consider medication side effect from statin therapy.   Discussed most recent lab results, most essentially within normal limits or stable from prior with exception of  hepatic function and CBC- platelets mildly decreased. Will repeat labs (CMP, CBC w/d, A1c, lipid panel) at follow up visit.  Meds ordered this encounter  Medications  . pravastatin (PRAVACHOL)  20 MG tablet    Sig: Take 1 tablet (20 mg total) by mouth daily.    Dispense:  90 tablet    Refill:  0    Order Specific Question:   Supervising Provider    Answer:   Beatrice Lecher D [[id number]]  . meloxicam (MOBIC) 7.5 MG tablet    Sig: Take 1 tablet (7.5 mg total) by mouth daily as needed for pain.    Dispense:  30 tablet    Refill:  0    Order Specific Question:   Supervising Provider    Answer:   Beatrice Lecher D [[id number]]    Follow-up: Return in about 3 months (around 11/06/2020) for DM, HTN, HLD.   Note:  This note was prepared with assistance of Dragon voice recognition software. Occasional wrong-word or sound-a-like substitutions may have occurred due to the inherent limitations of voice recognition software.  Lorrene Reid, PA-C

## 2020-08-07 NOTE — Patient Instructions (Signed)
Diabetes Mellitus and Foot Care Foot care is an important part of your health, especially when you have diabetes. Diabetes may cause you to have problems because of poor blood flow (circulation) to your feet and legs, which can cause your skin to:  Become thinner and drier.  Break more easily.  Heal more slowly.  Peel and crack. You may also have nerve damage (neuropathy) in your legs and feet, causing decreased feeling in them. This means that you may not notice minor injuries to your feet that could lead to more serious problems. Noticing and addressing any potential problems early is the best way to prevent future foot problems. How to care for your feet Foot hygiene  Wash your feet daily with warm water and mild soap. Do not use hot water. Then, pat your feet and the areas between your toes until they are completely dry. Do not soak your feet as this can dry your skin.  Trim your toenails straight across. Do not dig under them or around the cuticle. File the edges of your nails with an emery board or nail file.  Apply a moisturizing lotion or petroleum jelly to the skin on your feet and to dry, brittle toenails. Use lotion that does not contain alcohol and is unscented. Do not apply lotion between your toes.   Shoes and socks  Wear clean socks or stockings every day. Make sure they are not too tight. Do not wear knee-high stockings since they may decrease blood flow to your legs.  Wear shoes that fit properly and have enough cushioning. Always look in your shoes before you put them on to be sure there are no objects inside.  To break in new shoes, wear them for just a few hours a day. This prevents injuries on your feet. Wounds, scrapes, corns, and calluses  Check your feet daily for blisters, cuts, bruises, sores, and redness. If you cannot see the bottom of your feet, use a mirror or ask someone for help.  Do not cut corns or calluses or try to remove them with medicine.  If you  find a minor scrape, cut, or break in the skin on your feet, keep it and the skin around it clean and dry. You may clean these areas with mild soap and water. Do not clean the area with peroxide, alcohol, or iodine.  If you have a wound, scrape, corn, or callus on your foot, look at it several times a day to make sure it is healing and not infected. Check for: ? Redness, swelling, or pain. ? Fluid or blood. ? Warmth. ? Pus or a bad smell.   General tips  Do not cross your legs. This may decrease blood flow to your feet.  Do not use heating pads or hot water bottles on your feet. They may burn your skin. If you have lost feeling in your feet or legs, you may not know this is happening until it is too late.  Protect your feet from hot and cold by wearing shoes, such as at the beach or on hot pavement.  Schedule a complete foot exam at least once a year (annually) or more often if you have foot problems. Report any cuts, sores, or bruises to your health care provider immediately. Where to find more information  American Diabetes Association: www.diabetes.org  Association of Diabetes Care & Education Specialists: www.diabeteseducator.org Contact a health care provider if:  You have a medical condition that increases your risk of infection and   you have any cuts, sores, or bruises on your feet.  You have an injury that is not healing.  You have redness on your legs or feet.  You feel burning or tingling in your legs or feet.  You have pain or cramps in your legs and feet.  Your legs or feet are numb.  Your feet always feel cold.  You have pain around any toenails. Get help right away if:  You have a wound, scrape, corn, or callus on your foot and: ? You have pain, swelling, or redness that gets worse. ? You have fluid or blood coming from the wound, scrape, corn, or callus. ? Your wound, scrape, corn, or callus feels warm to the touch. ? You have pus or a bad smell coming from  the wound, scrape, corn, or callus. ? You have a fever. ? You have a red line going up your leg. Summary  Check your feet every day for blisters, cuts, bruises, sores, and redness.  Apply a moisturizing lotion or petroleum jelly to the skin on your feet and to dry, brittle toenails.  Wear shoes that fit properly and have enough cushioning.  If you have foot problems, report any cuts, sores, or bruises to your health care provider immediately.  Schedule a complete foot exam at least once a year (annually) or more often if you have foot problems. This information is not intended to replace advice given to you by your health care provider. Make sure you discuss any questions you have with your health care provider. Document Revised: 11/02/2019 Document Reviewed: 11/02/2019 Elsevier Patient Education  2021 [employer]  

## 2020-08-08 ENCOUNTER — Other Ambulatory Visit: Payer: 59

## 2020-08-09 LAB — B12 AND FOLATE PANEL
Folate: 7.6 ng/mL (ref >3.0)
Vitamin B-12: 520 pg/mL (ref 232–1245)

## 2020-08-09 LAB — MAGNESIUM: Magnesium: 1.5 mg/dL — ABNORMAL LOW (ref 1.6–2.3)

## 2020-08-09 LAB — RHEUMATOID FACTOR: Rheumatoid fact SerPl-aCnc: <10 IU/mL (ref <14.0)

## 2020-08-09 LAB — C-REACTIVE PROTEIN: CRP: 5 mg/L (ref 0–10)

## 2020-08-09 LAB — ANA: Anti Nuclear Antibody (ANA): NEGATIVE

## 2020-08-09 LAB — SEDIMENTATION RATE: Sed Rate: 43 mm/hr — ABNORMAL HIGH (ref 0–30)

## 2020-08-12 ENCOUNTER — Other Ambulatory Visit: Payer: Self-pay

## 2020-08-12 ENCOUNTER — Ambulatory Visit: Payer: Self-pay

## 2020-08-12 ENCOUNTER — Other Ambulatory Visit: Payer: Self-pay | Admitting: Family Medicine

## 2020-08-12 DIAGNOSIS — M25572 Pain in left ankle and joints of left foot: Secondary | ICD-10-CM

## 2020-08-16 ENCOUNTER — Other Ambulatory Visit: Payer: Self-pay | Admitting: Physician Assistant

## 2020-08-16 DIAGNOSIS — E1159 Type 2 diabetes mellitus with other circulatory complications: Secondary | ICD-10-CM

## 2020-08-16 DIAGNOSIS — I152 Hypertension secondary to endocrine disorders: Secondary | ICD-10-CM

## 2020-09-13 ENCOUNTER — Other Ambulatory Visit: Payer: Self-pay | Admitting: Physician Assistant

## 2020-09-13 DIAGNOSIS — E559 Vitamin D deficiency, unspecified: Secondary | ICD-10-CM

## 2020-09-14 ENCOUNTER — Other Ambulatory Visit: Payer: Self-pay | Admitting: Physician Assistant

## 2020-09-14 DIAGNOSIS — M2550 Pain in unspecified joint: Secondary | ICD-10-CM

## 2020-09-24 ENCOUNTER — Ambulatory Visit: Payer: 59 | Admitting: Physician Assistant

## 2020-10-19 ENCOUNTER — Other Ambulatory Visit: Payer: Self-pay | Admitting: Physician Assistant

## 2020-10-19 DIAGNOSIS — M2550 Pain in unspecified joint: Secondary | ICD-10-CM

## 2020-10-20 ENCOUNTER — Other Ambulatory Visit: Payer: Self-pay | Admitting: Physician Assistant

## 2020-10-20 DIAGNOSIS — E559 Vitamin D deficiency, unspecified: Secondary | ICD-10-CM

## 2020-10-21 ENCOUNTER — Telehealth: Payer: Self-pay | Admitting: Physician Assistant

## 2020-10-21 ENCOUNTER — Other Ambulatory Visit: Payer: Self-pay

## 2020-10-21 DIAGNOSIS — E782 Mixed hyperlipidemia: Secondary | ICD-10-CM

## 2020-10-21 DIAGNOSIS — E1169 Type 2 diabetes mellitus with other specified complication: Secondary | ICD-10-CM

## 2020-10-21 MED ORDER — PRAVASTATIN SODIUM 20 MG PO TABS: 20.0000 mg | ORAL_TABLET | Freq: Every day | ORAL | 0 refills | Status: DC

## 2020-10-21 NOTE — Telephone Encounter (Signed)
Please notify patient refills for his medications have been sent to the [facility] on [address].

## 2020-10-21 NOTE — Telephone Encounter (Signed)
Patient needs refills on pravastatin and Vitamin D. Patient uses [facility] in [place]. Please advise, thanks.

## 2020-11-06 ENCOUNTER — Encounter: Payer: Self-pay | Admitting: Physician Assistant

## 2020-11-06 ENCOUNTER — Other Ambulatory Visit: Payer: Self-pay

## 2020-11-06 ENCOUNTER — Ambulatory Visit: Payer: 59 | Admitting: Physician Assistant

## 2020-11-06 VITALS — BP 138/86 | HR 88 | Temp 98.4°F | Ht 74.0 in | Wt 356.0 lb

## 2020-11-06 DIAGNOSIS — R748 Abnormal levels of other serum enzymes: Secondary | ICD-10-CM

## 2020-11-06 DIAGNOSIS — E1159 Type 2 diabetes mellitus with other circulatory complications: Secondary | ICD-10-CM

## 2020-11-06 DIAGNOSIS — E1169 Type 2 diabetes mellitus with other specified complication: Secondary | ICD-10-CM | POA: Diagnosis not present

## 2020-11-06 DIAGNOSIS — M5416 Radiculopathy, lumbar region: Secondary | ICD-10-CM

## 2020-11-06 DIAGNOSIS — I152 Hypertension secondary to endocrine disorders: Secondary | ICD-10-CM

## 2020-11-06 LAB — POCT GLYCOSYLATED HEMOGLOBIN (HGB A1C): Hemoglobin A1C: 7.9 % — AB (ref 4.0–5.6)

## 2020-11-06 MED ORDER — GABAPENTIN 300 MG PO CAPS: 300.0000 mg | ORAL_CAPSULE | Freq: Every day | ORAL | 0 refills | Status: DC

## 2020-11-06 MED ORDER — TRULICITY 1.5 MG/0.5ML ~~LOC~~ SOAJ: 1.5000 mg | SUBCUTANEOUS | 2 refills | Status: DC

## 2020-11-06 NOTE — Patient Instructions (Signed)

## 2020-11-06 NOTE — Progress Notes (Signed)
Established Patient Office Visit  Subjective:  Patient ID: Ricardo Wolf, male    DOB: [birth year]  Age: 55 y.o. MRN: [id number]  CC:  Chief Complaint  Patient presents with   Depression   Hypertension   Hyperlipidemia    HPI [place] Comes presents for follow up on diabetes mellitus, hypertension and hyperlipidemia. Patient reports was on gabapentin 600 mg to take at bedtime and states only took half pill because full pill was too strong, which was to help with lower extremity pain. States meloxicam provides mild relief for his joint pain.   Diabetes: Pt denies increased urination, does report increased thirst. Pt reports medication compliance. No hypoglycemic events. Checking glucose at home. FBS range 170-193. Patient reports has not reduced soda or alcohol intake. Patient has not scheduled diabetic eye exam.   HTN: Pt denies chest pain, palpitations, dizziness or lower extremity swelling. Taking medication as directed without side effects. Patient does not check BP at home. Pt tries to monitor sodium intake.  HLD: Reports medication compliance. States could do better with reducing certain foods that are high in fat. Does not exercise.     Past Medical History:  Diagnosis Date   Diabetes ([place])    High cholesterol    Hypertension     Past Surgical History:  Procedure Laterality Date   BACK SURGERY  2003   BACK SURGERY  2011    Family History  Problem Relation Age of Onset   Diabetes Mother    Lung cancer Father    Diabetes Sister     Social History   Socioeconomic History   Marital status: Married    Spouse name: Not on file   Number of children: Not on file   Years of education: Not on file   Highest education level: Not on file  Occupational History   Not on file  Tobacco Use   Smoking status: Former    Packs/day: 1.00    Pack years: 0.00    Types: Cigarettes    Quit date: 2007    Years since quitting: 15.5   Smokeless tobacco: Never  Vaping Use    Vaping Use: Never used  Substance and Sexual Activity   Alcohol use: Yes    Alcohol/week: 2.0 standard drinks    Types: 2 Standard drinks or equivalent per week   Drug use: Never   Sexual activity: Yes    Partners: Female    Birth control/protection: None  Other Topics Concern   Not on file  Social History Narrative   Not on file   Social Determinants of Health   Financial Resource Strain: Not on file  Food Insecurity: Not on file  Transportation Needs: Not on file  Physical Activity: Not on file  Stress: Not on file  Social Connections: Not on file  Intimate Partner Violence: Not on file    Outpatient Medications Prior to Visit  Medication Sig Dispense Refill   Ez Smart Blood Glucose Lancets MISC Use to check fasting blood sugar 100 each 12   glucose blood test strip Use as instructed 100 each 12   ibuprofen (ADVIL) 800 MG tablet      linaGLIPtin-metFORMIN HCl (JENTADUETO) 2.08-998 MG TABS Take 1 tablet by mouth twice daily. 180 tablet 1   lisinopril (ZESTRIL) 20 MG tablet TAKE 1 TABLET BY MOUTH EVERY DAY 90 tablet 0   meloxicam (MOBIC) 7.5 MG tablet TAKE 1 TABLET BY MOUTH EVERY DAY AS NEEDED FOR PAIN 30 tablet 0   metoprolol  succinate (TOPROL-XL) 100 MG 24 hr tablet TAKE 1 TABLET BY MOUTH EVERY DAY 90 tablet 0   pravastatin (PRAVACHOL) 20 MG tablet Take 1 tablet (20 mg total) by mouth daily. 90 tablet 0   Vitamin D, Ergocalciferol, (DRISDOL) 1.25 MG (50000 UNIT) CAPS capsule TAKE 1 CAPSULE ON WEDNESDAY AND SUNDAY WEEKLY 9 capsule 2   TRULICITY 0.94 BS/9.6GE SOPN INJECT 0.5 MLS (0.75 MG TOTAL) INTO THE SKIN ONCE A WEEK. 6 mL 2   No facility-administered medications prior to visit.    Allergies  Allergen Reactions   Keflet [Cephalexin] Rash   Penicillins Rash   Sulfa Antibiotics Rash    ROS Review of Systems A fourteen system review of systems was performed and found to be positive as per HPI.    Objective:    Physical Exam General:  Well Developed, well  nourished, appropriate for stated age.  Neuro:  Alert and oriented,  extra-ocular muscles intact  HEENT:  Normocephalic, atraumatic, neck supple Skin:  no gross rash, warm, pink. Cardiac:  RRR Respiratory:  ECTA B/L, Not using accessory muscles, speaking in full sentences- unlabored. Vascular:  Ext warm, no cyanosis apprec.; cap RF less 2 sec. Psych:  No HI/SI, judgement and insight good, Euthymic mood. Full Affect.  BP 138/86   Pulse 88   Temp 98.4 F (36.9 C)   Ht '6\' 2"'  (1.88 m)   Wt (!) 356 lb 0.6 oz (161.5 kg)   SpO2 95%   BMI 45.71 kg/m  Wt Readings from Last 3 Encounters:  11/06/20 (!) 356 lb 0.6 oz (161.5 kg)  08/07/20 (!) 363 lb 6.4 oz (164.8 kg)  05/06/20 (!) 363 lb 12.8 oz (165 kg)     Health Maintenance Due  Topic Date Due   PNEUMOCOCCAL POLYSACCHARIDE VACCINE AGE 22-64 HIGH RISK  Never done   COVID-19 Vaccine (1) Never done   Pneumococcal Vaccine 61-37 Years old (1 - PCV) Never done   OPHTHALMOLOGY EXAM  Never done   Hepatitis C Screening  Never done   TETANUS/TDAP  Never done   Zoster Vaccines- Shingrix (1 of 2) Never done   COLONOSCOPY (Pts 45-63yr Insurance coverage will need to be confirmed)  Never done   FOOT EXAM  12/18/2018    There are no preventive care reminders to display for this patient.  Lab Results  Component Value Date   TSH 2.210 08/01/2020   Lab Results  Component Value Date   WBC 4.7 08/01/2020   HGB 14.7 08/01/2020   HCT 43.2 08/01/2020   MCV 86 08/01/2020   PLT 127 (L) 08/01/2020   Lab Results  Component Value Date   NA 138 08/01/2020   K 4.6 08/01/2020   CO2 20 08/01/2020   GLUCOSE 151 (H) 08/01/2020   BUN 16 08/01/2020   CREATININE 0.85 08/01/2020   BILITOT 0.6 08/01/2020   ALKPHOS 69 08/01/2020   AST 83 (H) 08/01/2020   ALT 95 (H) 08/01/2020   PROT 7.3 08/01/2020   ALBUMIN 4.2 08/01/2020   CALCIUM 9.1 08/01/2020   EGFR 103 08/01/2020   Lab Results  Component Value Date   CHOL 192 08/01/2020   Lab Results   Component Value Date   HDL 44 08/01/2020   Lab Results  Component Value Date   LDLCALC 124 (H) 08/01/2020   Lab Results  Component Value Date   TRIG 132 08/01/2020   Lab Results  Component Value Date   CHOLHDL 4.4 08/01/2020   Lab Results  Component Value Date  HGBA1C 7.9 (A) 11/06/2020      Assessment & Plan:   Problem List Items Addressed This Visit       Cardiovascular and Mediastinum   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9, discussed with patient treatment modifications and will increase Trulicity to 1.5 mg.  If A1c fails to improve or worsen at follow-up visit then recommend considering changing GLP-1 Trulicity to Ozempic.  Patient verbalized understanding.  Continue Jentadueto. -Discussed reducing simple carbohydrate and glucose intake including sodas and alcohol.  Discussed with patient the importance of obtaining annual diabetic eye exams, verbalizes will schedule one for this year. -Continue ambulatory glucose monitoring. -Follow-up in 3 months.  Hypertension associated with diabetes: -Stable. -Continue current medication regimen.  Last CMP, renal function and electrolytes within normal limits.   -Will repeat CMP follow-up visit.  Morbid obesity: -Associated with diabetes mellitus type 2, hypertension, hyperlipidemia. -Thoroughly discussed  dietary and lifestyle modifications.  Elevated liver enzymes: -Discussed reducing alcohol intake, limit to 2 drinks/day or no more than 14 drinks/wk. Encourage to avoid binge drinking. -Recommend weight loss and to avoid hepatotoxic substances such as Tylenol. -Will continue to monitor and repeat hepatic function on follow-up visit. -Reviewed prior labs/imaging and unable to locate hepatitis panel or liver ultrasound. HIV antibody negative (12/15/2017)  Recommend further evaluation for elevated liver enzymes and will discuss at follow-up visit.  Left lumbar radiculopathy: -Discussed with patient potential side effects with Gabapentin, will provide rx for 300 mg.  -Discussed nonpharmacologic therapy including weight loss and low back exercises. If symptoms fail to improve or worsen recommend to consider PT.    Meds ordered this encounter  Medications   Dulaglutide (TRULICITY) 1.5 WC/5.8NI SOPN    Sig: Inject 1.5 mg into the skin once a week.    Dispense:  2 mL    Refill:  2    Order Specific Question:   Supervising Provider    Answer:   Beatrice Lecher D [[id number]]   gabapentin (NEURONTIN) 300 MG capsule    Sig: Take 1 capsule (300 mg total) by mouth at bedtime.    Dispense:  90 capsule    Refill:  0    Order Specific Question:   Supervising Provider    Answer:   Beatrice Lecher D [[id number]]     Follow-up: Return in about 3 months (around 02/06/2021) for DM-inc med, HTN, HLD and FBW (lipid panel, cmp, a1c, cbc).   Note:  This note was prepared with assistance of Dragon voice recognition software. Occasional wrong-word or sound-a-like substitutions may have occurred due to the inherent limitations of voice recognition software.  Lorrene Reid, PA-C

## 2020-11-16 ENCOUNTER — Other Ambulatory Visit: Payer: Self-pay | Admitting: Physician Assistant

## 2020-11-16 DIAGNOSIS — I152 Hypertension secondary to endocrine disorders: Secondary | ICD-10-CM

## 2020-11-16 DIAGNOSIS — E1159 Type 2 diabetes mellitus with other circulatory complications: Secondary | ICD-10-CM

## 2020-12-16 ENCOUNTER — Other Ambulatory Visit: Payer: Self-pay | Admitting: Physician Assistant

## 2020-12-16 DIAGNOSIS — E1169 Type 2 diabetes mellitus with other specified complication: Secondary | ICD-10-CM

## 2021-01-08 ENCOUNTER — Other Ambulatory Visit: Payer: Self-pay | Admitting: Physician Assistant

## 2021-01-08 DIAGNOSIS — E559 Vitamin D deficiency, unspecified: Secondary | ICD-10-CM

## 2021-01-23 ENCOUNTER — Other Ambulatory Visit: Payer: Self-pay | Admitting: Physician Assistant

## 2021-01-23 DIAGNOSIS — E782 Mixed hyperlipidemia: Secondary | ICD-10-CM

## 2021-01-23 DIAGNOSIS — E1169 Type 2 diabetes mellitus with other specified complication: Secondary | ICD-10-CM

## 2021-01-23 DIAGNOSIS — M2550 Pain in unspecified joint: Secondary | ICD-10-CM

## 2021-01-23 DIAGNOSIS — E559 Vitamin D deficiency, unspecified: Secondary | ICD-10-CM

## 2021-01-27 ENCOUNTER — Other Ambulatory Visit: Payer: Self-pay

## 2021-01-27 ENCOUNTER — Other Ambulatory Visit: Payer: Self-pay | Admitting: Physician Assistant

## 2021-01-27 DIAGNOSIS — I152 Hypertension secondary to endocrine disorders: Secondary | ICD-10-CM

## 2021-01-27 DIAGNOSIS — E1169 Type 2 diabetes mellitus with other specified complication: Secondary | ICD-10-CM

## 2021-01-27 DIAGNOSIS — E1159 Type 2 diabetes mellitus with other circulatory complications: Secondary | ICD-10-CM

## 2021-01-27 DIAGNOSIS — M5416 Radiculopathy, lumbar region: Secondary | ICD-10-CM

## 2021-01-27 DIAGNOSIS — E559 Vitamin D deficiency, unspecified: Secondary | ICD-10-CM

## 2021-01-27 DIAGNOSIS — E782 Mixed hyperlipidemia: Secondary | ICD-10-CM

## 2021-01-27 MED ORDER — VITAMIN D (ERGOCALCIFEROL) 1.25 MG (50000 UNIT) PO CAPS: ORAL_CAPSULE | ORAL | 3 refills | Status: DC

## 2021-01-27 MED ORDER — TRULICITY 1.5 MG/0.5ML ~~LOC~~ SOAJ: 1.5000 mg | SUBCUTANEOUS | 2 refills | Status: DC

## 2021-01-27 MED ORDER — GABAPENTIN 300 MG PO CAPS: 300.0000 mg | ORAL_CAPSULE | Freq: Every day | ORAL | 0 refills | Status: DC

## 2021-01-27 MED ORDER — JENTADUETO 2.5-1000 MG PO TABS: ORAL_TABLET | ORAL | 5 refills | Status: DC

## 2021-01-27 MED ORDER — PRAVASTATIN SODIUM 20 MG PO TABS: 20.0000 mg | ORAL_TABLET | Freq: Every day | ORAL | 2 refills | Status: DC

## 2021-01-27 MED ORDER — METOPROLOL SUCCINATE ER 100 MG PO TB24: 100.0000 mg | ORAL_TABLET | Freq: Every day | ORAL | 2 refills | Status: DC

## 2021-01-27 MED ORDER — LISINOPRIL 20 MG PO TABS: 20.0000 mg | ORAL_TABLET | Freq: Every day | ORAL | 2 refills | Status: DC

## 2021-02-06 ENCOUNTER — Other Ambulatory Visit: Payer: Self-pay

## 2021-02-06 ENCOUNTER — Ambulatory Visit: Payer: 59 | Admitting: Physician Assistant

## 2021-02-06 ENCOUNTER — Encounter: Payer: Self-pay | Admitting: Physician Assistant

## 2021-02-06 VITALS — BP 135/87 | HR 84 | Temp 98.0°F | Ht 74.0 in | Wt 350.0 lb

## 2021-02-06 DIAGNOSIS — E1159 Type 2 diabetes mellitus with other circulatory complications: Secondary | ICD-10-CM | POA: Diagnosis not present

## 2021-02-06 DIAGNOSIS — Z1321 Encounter for screening for nutritional disorder: Secondary | ICD-10-CM

## 2021-02-06 DIAGNOSIS — E559 Vitamin D deficiency, unspecified: Secondary | ICD-10-CM | POA: Diagnosis not present

## 2021-02-06 DIAGNOSIS — E1169 Type 2 diabetes mellitus with other specified complication: Secondary | ICD-10-CM | POA: Diagnosis not present

## 2021-02-06 DIAGNOSIS — Z130 Encounter for screening for diseases of the blood and blood-forming organs and certain disorders involving the immune mechanism: Secondary | ICD-10-CM

## 2021-02-06 DIAGNOSIS — I152 Hypertension secondary to endocrine disorders: Secondary | ICD-10-CM

## 2021-02-06 DIAGNOSIS — Z13228 Encounter for screening for other metabolic disorders: Secondary | ICD-10-CM

## 2021-02-06 DIAGNOSIS — E782 Mixed hyperlipidemia: Secondary | ICD-10-CM

## 2021-02-06 DIAGNOSIS — Z1329 Encounter for screening for other suspected endocrine disorder: Secondary | ICD-10-CM

## 2021-02-06 LAB — POCT GLYCOSYLATED HEMOGLOBIN (HGB A1C): Hemoglobin A1C: 7.6 % — AB (ref 4.0–5.6)

## 2021-02-06 NOTE — Addendum Note (Signed)
Addended by: Aron Baba on: 02/06/2021 11:00 AM   Modules accepted: Orders

## 2021-02-06 NOTE — Assessment & Plan Note (Signed)
-  Stable. -Continue current medication regimen. -Will collect CMP for medication monitoring. -Will continue to monitor. 

## 2021-02-06 NOTE — Assessment & Plan Note (Signed)
-  A1c has improved from 7.9 to 7.6, will continue current medication regimen. -Recommend to continue to reduce carbohydrate and glucose intake. -Advised to forward copy of eye exam. -Will continue to monitor.

## 2021-02-06 NOTE — Patient Instructions (Signed)
Type 2 Diabetes Mellitus, Diagnosis, Adult °Type 2 diabetes (type 2 diabetes mellitus) is a long-term (chronic) disease. It may happen when there is one or both of these problems: °The pancreas does not make enough insulin. °The body does not react in a normal way to insulin that it makes. °Insulin lets sugars go into cells in your body. If you have type 2 diabetes, sugars cannot get into your cells. Sugars build up in the blood. This causes high blood sugar. °What are the causes? °The exact cause of this condition is not known. °What increases the risk? °Having type 2 diabetes in your family. °Being overweight or very overweight. °Not being active. °Your body not reacting in a normal way to the insulin it makes. °Having higher than normal blood sugar over time. °Having a type of diabetes when you were pregnant. °Having a condition that causes small fluid-filled sacs on your ovaries. °What are the signs or symptoms? °At first, you may have no symptoms. You will get symptoms slowly. They may include: °More thirst than normal. °More hunger than normal. °Needing to pee more than normal. °Losing weight without trying. °Feeling tired. °Feeling weak. °Seeing things blurry. °Dark patches on your skin. °How is this treated? °This condition may be treated by a diabetes expert. You may need to: °Follow an eating plan made by a food expert (dietitian). °Get regular exercise. °Find ways to deal with stress. °Check blood sugar as often as told. °Take medicines. °Your doctor will set treatment goals for you. Your blood sugar should be at these levels: °Before meals: 80-130 mg/dL (4.4-7.2 mmol/L). °After meals: below 180 mg/dL (10 mmol/L). °Over the last 2-3 months: less than 7%. °Follow these instructions at home: °Medicines °Take your diabetes medicines or insulin every day. °Take medicines as told to help you prevent other problems caused by this condition. You may need: °Aspirin. °Medicine to lower cholesterol. °Medicine to  control blood pressure. °Questions to ask your doctor °Should I meet with a diabetes educator? °What medicines do I need, and when should I take them? °What will I need to treat my condition at home? °When should I check my blood sugar? °Where can I find a support group? °Who can I call if I have questions? °When is my next doctor visit? °General instructions °Take over-the-counter and prescription medicines only as told by your doctor. °Keep all follow-up visits. °Where to find more information °For help and guidance and more information about diabetes, please go to: °American Diabetes Association (ADA): www.diabetes.org °American Association of Diabetes Care and Education Specialists (ADCES): www.diabeteseducator.org °International Diabetes Federation (IDF): www.idf.org °Contact a doctor if: °Your blood sugar is at or above 240 mg/dL (13.3 mmol/L) for 2 days in a row. °You have been sick for 2 days or more, and you are not getting better. °You have had a fever for 2 days or more, and you are not getting better. °You have any of these problems for more than 6 hours: °You cannot eat or drink. °You feel like you may vomit. °You vomit. °You have watery poop (diarrhea). °Get help right away if: °Your blood sugar is lower than 54 mg/dL (3 mmol/L). °You feel mixed up (confused). °You have trouble thinking clearly. °You have trouble breathing. °You have medium or large ketone levels in your pee. °These symptoms may be an emergency. Get help right away. Call your local emergency services (911 in the [place] °Do not wait to see if the symptoms will go away. °Do not drive yourself   to the hospital. °Summary °Type 2 diabetes is a long-term disease. Your pancreas may not make enough insulin, or your body may not react in a normal way to insulin that it makes. °This condition is treated with an eating plan, lifestyle changes, and medicines. °Your doctor will set treatment goals for you. These will help you keep your blood sugar  in a healthy range. °Keep all follow-up visits. °This information is not intended to replace advice given to you by your health care provider. Make sure you discuss any questions you have with your health care provider. °Document Revised: 07/08/2020 Document Reviewed: 07/08/2020 °Elsevier Patient Education © 2022 [employer] Inc. ° °

## 2021-02-06 NOTE — Progress Notes (Signed)
Established Patient Office Visit  Subjective:  Patient ID: Ricardo Wolf, male    DOB: [birth year]  Age: 55 y.o. MRN: [id number]  CC:  Chief Complaint  Patient presents with   Follow-up   Diabetes   Hypertension   Hyperlipidemia    HPI Ricardo Wolf presents for follow up on diabetes mellitus, hypertension and hyperlipidemia. Reports meloxicam helps with joint pain. Takes medication when pain is severe and is having a hard time falling asleep.  Diabetes: Pt denies increased urination or thirst. Pt reports medication compliance. No hypoglycemic events. Not checking glucose at home, lost glucometer kit when he went camping. Reports has reduced sodas and not eating as much. States scheduled for eye exam 02-17-21 at [facility].  HTN: Pt denies chest pain, palpitations, dizziness, orthopnea, or lower extremity swelling. Taking medication as directed without side effects. Has not checked BP at home. Pt follows a low salt diet.  HLD: Pt taking medication as directed without issues. Reports has reduced coffee.    Past Medical History:  Diagnosis Date   Diabetes ([place])    High cholesterol    Hypertension     Past Surgical History:  Procedure Laterality Date   BACK SURGERY  2003   BACK SURGERY  2011    Family History  Problem Relation Age of Onset   Diabetes Mother    Lung cancer Father    Diabetes Sister     Social History   Socioeconomic History   Marital status: Married    Spouse name: Not on file   Number of children: Not on file   Years of education: Not on file   Highest education level: Not on file  Occupational History   Not on file  Tobacco Use   Smoking status: Former    Packs/day: 1.00    Types: Cigarettes    Quit date: 2007    Years since quitting: 15.7   Smokeless tobacco: Never  Vaping Use   Vaping Use: Never used  Substance and Sexual Activity   Alcohol use: Yes    Alcohol/week: 2.0 standard drinks    Types: 2 Standard drinks or  equivalent per week   Drug use: Never   Sexual activity: Yes    Partners: Female    Birth control/protection: None  Other Topics Concern   Not on file  Social History Narrative   Not on file   Social Determinants of Health   Financial Resource Strain: Not on file  Food Insecurity: Not on file  Transportation Needs: Not on file  Physical Activity: Not on file  Stress: Not on file  Social Connections: Not on file  Intimate Partner Violence: Not on file    Outpatient Medications Prior to Visit  Medication Sig Dispense Refill   Dulaglutide (TRULICITY) 1.5 PY/1.9JK SOPN Inject 1.5 mg into the skin once a week. 2 mL 2   Ez Smart Blood Glucose Lancets MISC Use to check fasting blood sugar 100 each 12   gabapentin (NEURONTIN) 300 MG capsule Take 1 capsule (300 mg total) by mouth at bedtime. 90 capsule 0   glucose blood test strip Use as instructed 100 each 12   ibuprofen (ADVIL) 800 MG tablet      linaGLIPtin-metFORMIN HCl (JENTADUETO) 2.08-998 MG TABS TAKE 1 TABLET BY MOUTH TWICE A DAY 60 tablet 5   lisinopril (ZESTRIL) 20 MG tablet Take 1 tablet (20 mg total) by mouth daily. 30 tablet 2   meloxicam (MOBIC) 7.5 MG tablet TAKE 1  TABLET BY MOUTH EVERY DAY AS NEEDED FOR PAIN 30 tablet 0   metoprolol succinate (TOPROL-XL) 100 MG 24 hr tablet Take 1 tablet (100 mg total) by mouth daily. Take with or immediately following a meal. 30 tablet 2   pravastatin (PRAVACHOL) 20 MG tablet Take 1 tablet (20 mg total) by mouth daily. 30 tablet 2   Vitamin D, Ergocalciferol, (DRISDOL) 1.25 MG (50000 UNIT) CAPS capsule Take 1 capsule on Wednesday and Sunday weekly 8 capsule 3   No facility-administered medications prior to visit.    Allergies  Allergen Reactions   Keflet [Cephalexin] Rash   Penicillins Rash   Sulfa Antibiotics Rash    ROS Review of Systems A fourteen system review of systems was performed and found to be positive as per HPI.   Objective:    Physical Exam General:  Pleasant  and cooperative, in no acute distress.  Neuro:  Alert and oriented,  extra-ocular muscles intact  HEENT:  Normocephalic, atraumatic, neck supple  Skin:  no gross rash, warm, pink. Cardiac:  RRR, S1 S2 Respiratory: CTA B/L, Not using accessory muscles, speaking in full sentences- unlabored. Vascular:  Ext warm, no cyanosis apprec.; cap RF less 2 sec. Psych:  No HI/SI, judgement and insight good, Euthymic mood. Full Affect.  BP 135/87   Pulse 84   Temp 98 F (36.7 C)   Ht '6\' 2"'  (1.88 m)   Wt (!) 350 lb (158.8 kg)   SpO2 96%   BMI 44.94 kg/m  Wt Readings from Last 3 Encounters:  02/06/21 (!) 350 lb (158.8 kg)  11/06/20 (!) 356 lb 0.6 oz (161.5 kg)  08/07/20 (!) 363 lb 6.4 oz (164.8 kg)     Health Maintenance Due  Topic Date Due   COVID-19 Vaccine (1) Never done   OPHTHALMOLOGY EXAM  Never done   Hepatitis C Screening  Never done   TETANUS/TDAP  Never done   Zoster Vaccines- Shingrix (1 of 2) Never done   COLONOSCOPY (Pts 45-21yr Insurance coverage will need to be confirmed)  Never done   FOOT EXAM  12/18/2018   INFLUENZA VACCINE  11/25/2020    There are no preventive care reminders to display for this patient.  Lab Results  Component Value Date   TSH 2.210 08/01/2020   Lab Results  Component Value Date   WBC 4.7 08/01/2020   HGB 14.7 08/01/2020   HCT 43.2 08/01/2020   MCV 86 08/01/2020   PLT 127 (L) 08/01/2020   Lab Results  Component Value Date   NA 138 08/01/2020   K 4.6 08/01/2020   CO2 20 08/01/2020   GLUCOSE 151 (H) 08/01/2020   BUN 16 08/01/2020   CREATININE 0.85 08/01/2020   BILITOT 0.6 08/01/2020   ALKPHOS 69 08/01/2020   AST 83 (H) 08/01/2020   ALT 95 (H) 08/01/2020   PROT 7.3 08/01/2020   ALBUMIN 4.2 08/01/2020   CALCIUM 9.1 08/01/2020   EGFR 103 08/01/2020   Lab Results  Component Value Date   CHOL 192 08/01/2020   Lab Results  Component Value Date   HDL 44 08/01/2020   Lab Results  Component Value Date   LDLCALC 124 (H)  08/01/2020   Lab Results  Component Value Date   TRIG 132 08/01/2020   Lab Results  Component Value Date   CHOLHDL 4.4 08/01/2020   Lab Results  Component Value Date   HGBA1C 7.9 (A) 11/06/2020      Assessment & Plan:   Problem List Items  Addressed This Visit       Cardiovascular and Mediastinum   Hypertension associated with diabetes ([place]) - Primary (Chronic)    -Stable. -Continue current medication regimen. -Will collect CMP for medication monitoring. -Will continue to monitor.      Relevant Orders   CBC with Differential/Platelet   Comprehensive metabolic panel     Endocrine   Diabetes mellitus ([facility]) (Chronic)    -A1c has improved from 7.9 to 7.6, will continue current medication regimen. -Recommend to continue to reduce carbohydrate and glucose intake. -Advised to forward copy of eye exam. -Will continue to monitor.      Relevant Orders   CBC with Differential/Platelet   Mixed diabetic hyperlipidemia associated with type 2 diabetes mellitus ([facility]) (Chronic)    -Last lipid panel: total cholesterol 192, triglycerides 132, HDL 44, LDL 124 (goal <70). -Will repeat lipid panel and hepatic function. -Continue current medication regimen. -Discussed low fat diet.      Relevant Orders   CBC with Differential/Platelet   Lipid panel     Other   Vitamin D deficiency   Relevant Orders   CBC with Differential/Platelet   VITAMIN D 25 Hydroxy (Vit-D Deficiency, Fractures)   Other Visit Diagnoses     Screening for endocrine, nutritional, metabolic and immunity disorder       Relevant Orders   CBC with Differential/Platelet   Comprehensive metabolic panel   Lipid panel   TSH   VITAMIN D 25 Hydroxy (Vit-D Deficiency, Fractures)      Vitamin D deficiency: -Last Vit D 54.8, will repeat Vitamin D.  -Pending lab results will adjust treatment plan if indicated.  No orders of the defined types were placed in this encounter.   Follow-up: Return in about 4  months (around 06/09/2021) for CPE .    Lorrene Reid, PA-C

## 2021-02-06 NOTE — Assessment & Plan Note (Signed)
-  Last lipid panel: total cholesterol 192, triglycerides 132, HDL 44, LDL 124 (goal <70). -Will repeat lipid panel and hepatic function. -Continue current medication regimen. -Discussed low fat diet.

## 2021-02-07 LAB — COMPREHENSIVE METABOLIC PANEL
ALT: 89 IU/L — ABNORMAL HIGH (ref 0–44)
AST: 91 IU/L — ABNORMAL HIGH (ref 0–40)
Albumin/Globulin Ratio: 1.4 (ref 1.2–2.2)
Albumin: 4.3 g/dL (ref 3.8–4.9)
Alkaline Phosphatase: 72 IU/L (ref 44–121)
BUN/Creatinine Ratio: 23 — ABNORMAL HIGH (ref 9–20)
BUN: 19 mg/dL (ref 6–24)
Bilirubin Total: 0.5 mg/dL (ref 0.0–1.2)
CO2: 22 mmol/L (ref 20–29)
Calcium: 9.3 mg/dL (ref 8.7–10.2)
Chloride: 102 mmol/L (ref 96–106)
Creatinine, Ser: 0.82 mg/dL (ref 0.76–1.27)
Globulin, Total: 3.1 g/dL (ref 1.5–4.5)
Glucose: 127 mg/dL — ABNORMAL HIGH (ref 70–99)
Potassium: 4.6 mmol/L (ref 3.5–5.2)
Sodium: 138 mmol/L (ref 134–144)
Total Protein: 7.4 g/dL (ref 6.0–8.5)
eGFR: 104 mL/min/{1.73_m2} (ref >59)

## 2021-02-07 LAB — CBC WITH DIFFERENTIAL/PLATELET
Basophils Absolute: 0.1 10*3/uL (ref 0.0–0.2)
Basos: 1 %
EOS (ABSOLUTE): 0.2 10*3/uL (ref 0.0–0.4)
Eos: 3 %
Hematocrit: 45.1 % (ref 37.5–51.0)
Hemoglobin: 15.3 g/dL (ref 13.0–17.7)
Immature Grans (Abs): 0 10*3/uL (ref 0.0–0.1)
Immature Granulocytes: 0 %
Lymphocytes Absolute: 1.5 10*3/uL (ref 0.7–3.1)
Lymphs: 30 %
MCH: 29.6 pg (ref 26.6–33.0)
MCHC: 33.9 g/dL (ref 31.5–35.7)
MCV: 87 fL (ref 79–97)
Monocytes Absolute: 0.4 10*3/uL (ref 0.1–0.9)
Monocytes: 8 %
Neutrophils Absolute: 2.9 10*3/uL (ref 1.4–7.0)
Neutrophils: 58 %
Platelets: 144 10*3/uL — ABNORMAL LOW (ref 150–450)
RBC: 5.17 x10E6/uL (ref 4.14–5.80)
RDW: 12.6 % (ref 11.6–15.4)
WBC: 5 10*3/uL (ref 3.4–10.8)

## 2021-02-07 LAB — LIPID PANEL
Chol/HDL Ratio: 4.2 ratio (ref 0.0–5.0)
Cholesterol, Total: 171 mg/dL (ref 100–199)
HDL: 41 mg/dL (ref >39)
LDL Chol Calc (NIH): 112 mg/dL — ABNORMAL HIGH (ref 0–99)
Triglycerides: 100 mg/dL (ref 0–149)
VLDL Cholesterol Cal: 18 mg/dL (ref 5–40)

## 2021-02-07 LAB — VITAMIN D 25 HYDROXY (VIT D DEFICIENCY, FRACTURES): Vit D, 25-Hydroxy: 81.4 ng/mL (ref 30.0–100.0)

## 2021-02-07 LAB — TSH: TSH: 2.14 u[IU]/mL (ref 0.450–4.500)

## 2021-02-17 LAB — HM DIABETES EYE EXAM

## 2021-02-26 ENCOUNTER — Other Ambulatory Visit: Payer: Self-pay | Admitting: Physician Assistant

## 2021-02-26 DIAGNOSIS — M2550 Pain in unspecified joint: Secondary | ICD-10-CM

## 2021-03-04 ENCOUNTER — Encounter: Payer: Self-pay | Admitting: Physician Assistant

## 2021-04-22 IMAGING — DX DG ANKLE COMPLETE 3+V*L*
3 series · 3 of 3 positions shown · non-contrast
Comparison: None.

CLINICAL DATA: Left ankle pain after twisting injury 06/14/2020

EXAM:
LEFT ANKLE COMPLETE - 3+ VIEW

[ankle ap]
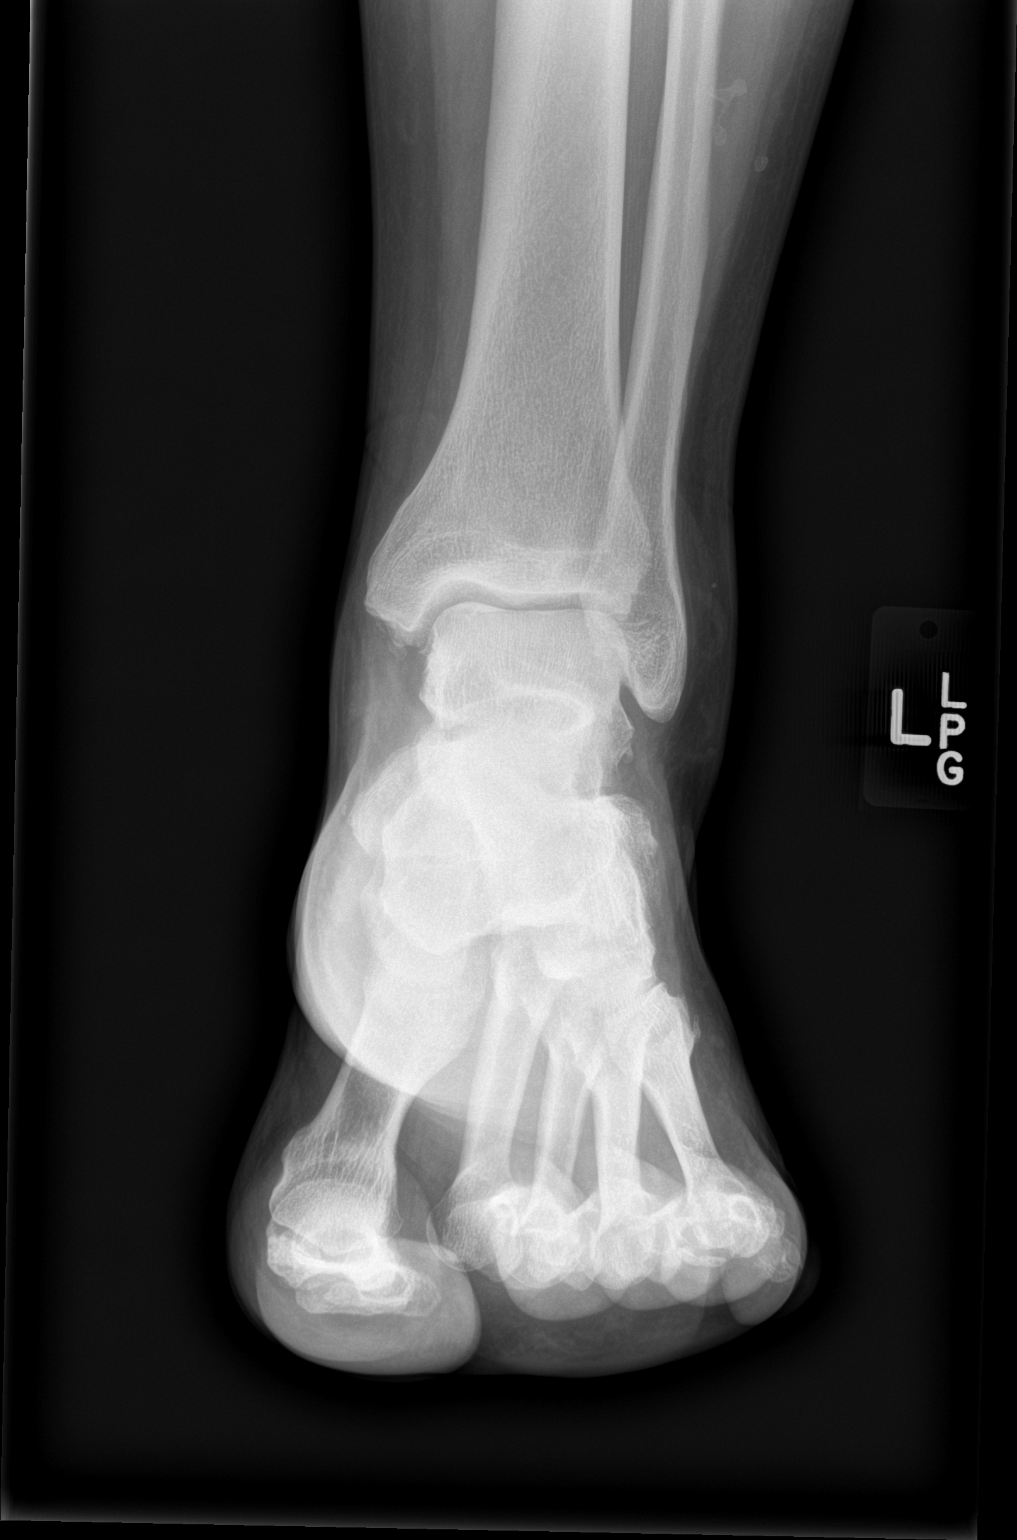

[ankle mortise]
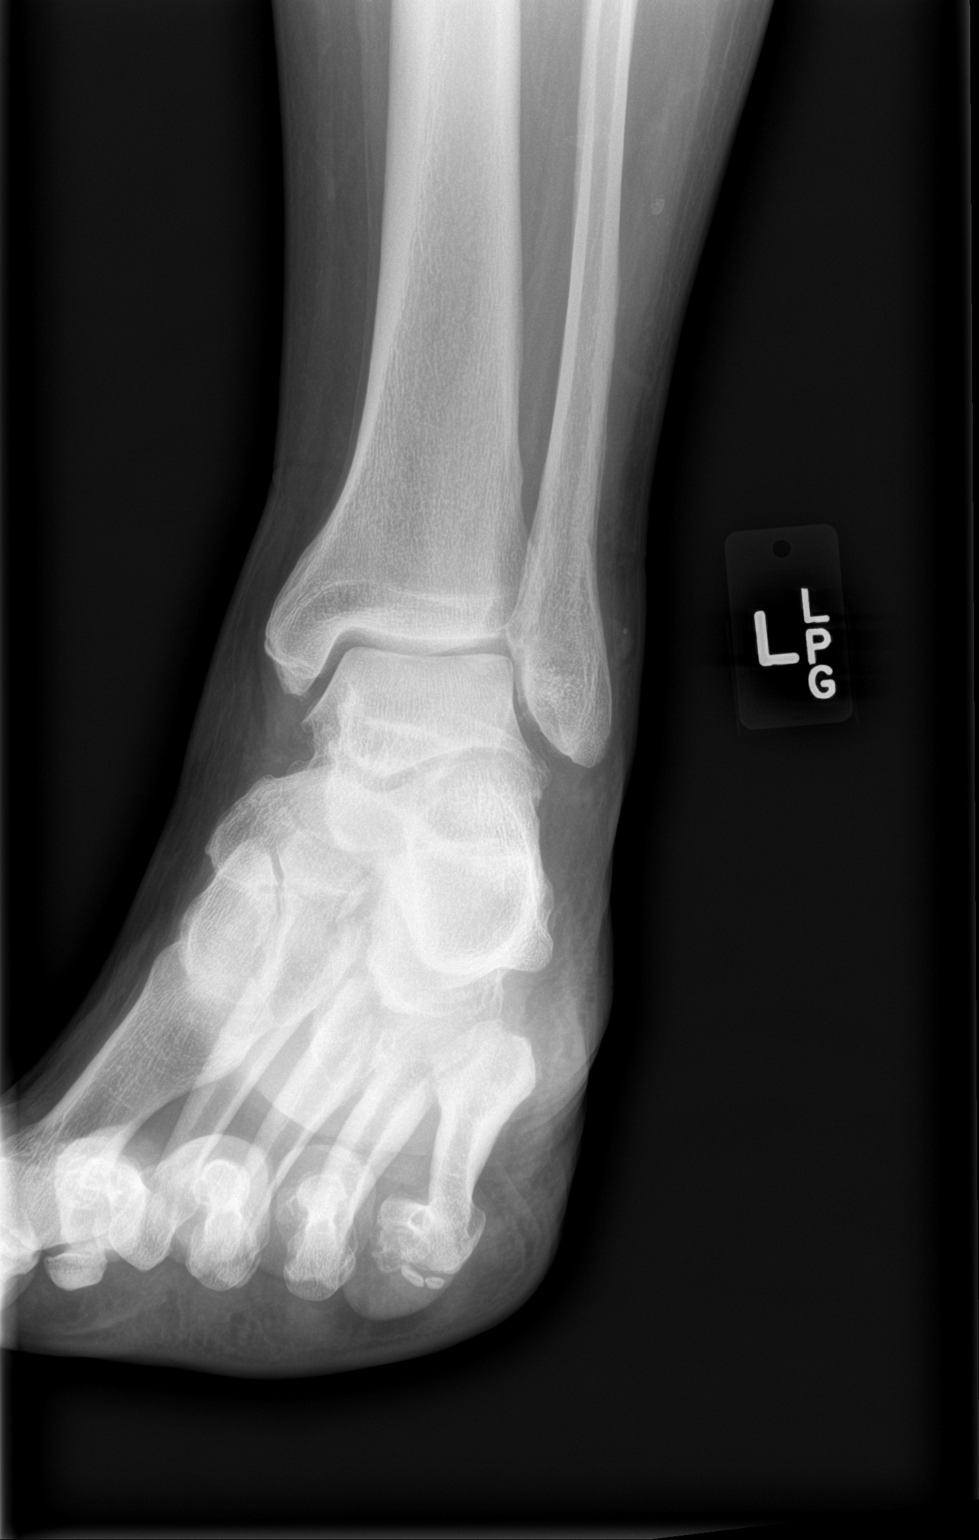

[ankle lat]
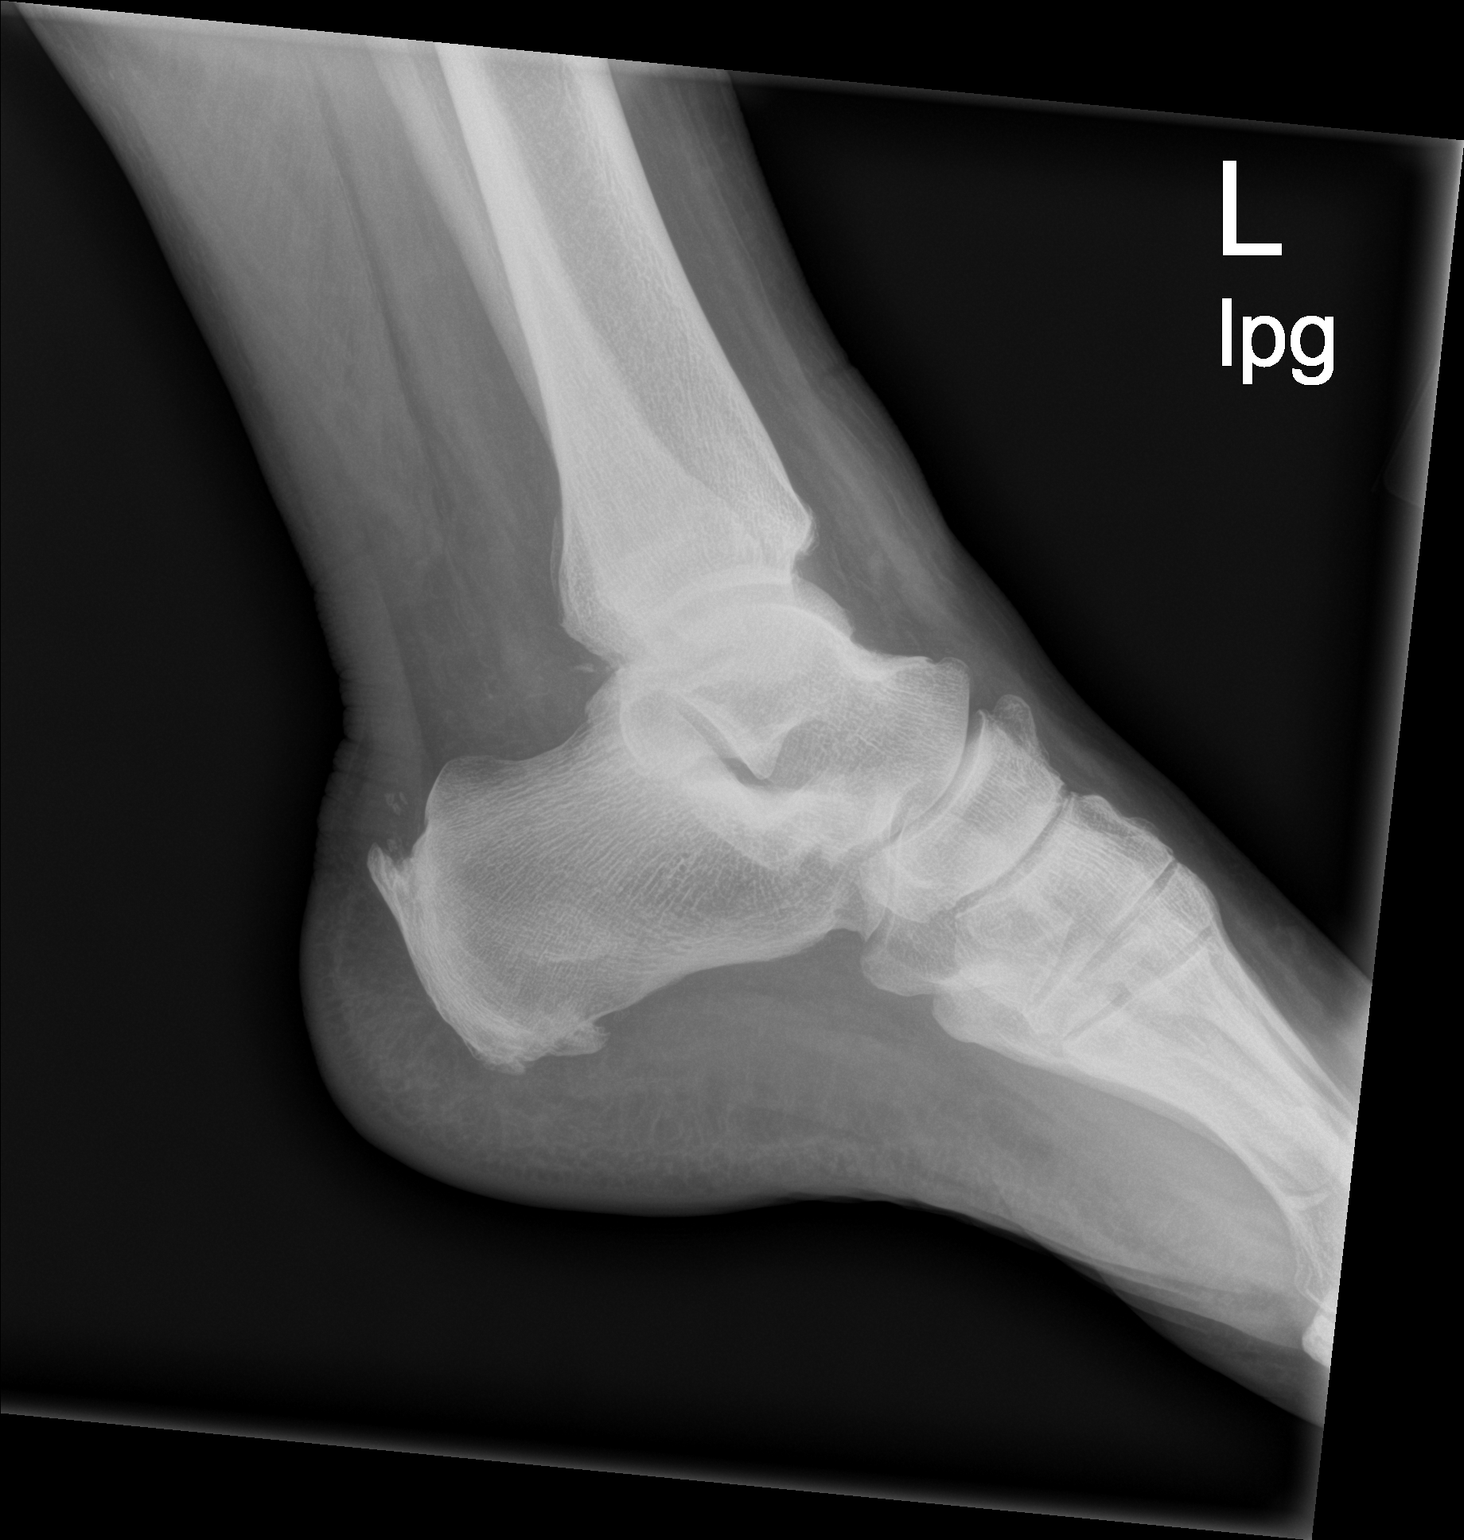

[3 of 3 positions shown; findings below may reference images not displayed]

FINDINGS: Equivocal for soft tissue swelling. [place]/remote avulsion fracture
at the medial malleolus. No acute or unhealed fracture. Bony density
posterior to the distal tibia is likely a ridge, no donor site is
seen. No narrowing of the ankle joint. Plantar and dorsal calcaneal
spurs.
IMPRESSION: No subluxation or ankle joint narrowing.

Heel spurs.

## 2021-05-10 ENCOUNTER — Other Ambulatory Visit: Payer: Self-pay | Admitting: Physician Assistant

## 2021-05-10 DIAGNOSIS — M2550 Pain in unspecified joint: Secondary | ICD-10-CM

## 2021-05-14 ENCOUNTER — Other Ambulatory Visit: Payer: Self-pay | Admitting: Physician Assistant

## 2021-05-14 DIAGNOSIS — E782 Mixed hyperlipidemia: Secondary | ICD-10-CM

## 2021-05-14 DIAGNOSIS — E1169 Type 2 diabetes mellitus with other specified complication: Secondary | ICD-10-CM

## 2021-05-16 ENCOUNTER — Other Ambulatory Visit: Payer: Self-pay | Admitting: Physician Assistant

## 2021-05-16 DIAGNOSIS — E1169 Type 2 diabetes mellitus with other specified complication: Secondary | ICD-10-CM

## 2021-06-10 NOTE — Progress Notes (Signed)
Complete physical exam   Patient: Ricardo Wolf   DOB: May 28, 1965   56 y.o. Male  MRN: [id number] Visit Date: 06/12/2021   Chief Complaint  Patient presents with   Annual Exam   Subjective    Ricardo Wolf is a 56 y.o. male who presents today for a complete physical exam.  He reports consuming a general and low carbohydrate  diet. Has reduced sodas and alcohol intake. Home exercise routine includes disc golf on the weekends, no other routine exercise regimen. He generally feels fairly well. He does not have additional problems to discuss today.     Past Medical History:  Diagnosis Date   Diabetes ([place])    High cholesterol    Hypertension    Past Surgical History:  Procedure Laterality Date   BACK SURGERY  2003   BACK SURGERY  2011   Social History   Socioeconomic History   Marital status: Married    Spouse name: Not on file   Number of children: Not on file   Years of education: Not on file   Highest education level: Not on file  Occupational History   Not on file  Tobacco Use   Smoking status: Former    Packs/day: 1.00    Types: Cigarettes    Quit date: 2007    Years since quitting: 16.1   Smokeless tobacco: Never  Vaping Use   Vaping Use: Never used  Substance and Sexual Activity   Alcohol use: Yes    Alcohol/week: 2.0 standard drinks    Types: 2 Standard drinks or equivalent per week   Drug use: Never   Sexual activity: Yes    Partners: Female    Birth control/protection: None  Other Topics Concern   Not on file  Social History Narrative   Not on file   Social Determinants of Health   Financial Resource Strain: Not on file  Food Insecurity: Not on file  Transportation Needs: Not on file  Physical Activity: Not on file  Stress: Not on file  Social Connections: Not on file  Intimate Partner Violence: Not on file     Medications: Outpatient Medications Prior to Visit  Medication Sig   Ez Smart Blood Glucose Lancets MISC Use to check  fasting blood sugar   gabapentin (NEURONTIN) 300 MG capsule Take 1 capsule (300 mg total) by mouth at bedtime.   glucose blood test strip Use as instructed   ibuprofen (ADVIL) 800 MG tablet    linaGLIPtin-metFORMIN HCl (JENTADUETO) 2.08-998 MG TABS TAKE 1 TABLET BY MOUTH TWICE A DAY   lisinopril (ZESTRIL) 20 MG tablet Take 1 tablet (20 mg total) by mouth daily.   meloxicam (MOBIC) 7.5 MG tablet TAKE 1 TABLET BY MOUTH EVERY DAY AS NEEDED FOR PAIN   metoprolol succinate (TOPROL-XL) 100 MG 24 hr tablet Take 1 tablet (100 mg total) by mouth daily. Take with or immediately following a meal.   pravastatin (PRAVACHOL) 20 MG tablet TAKE 1 TABLET BY MOUTH EVERY DAY   TRULICITY 1.5 GM/0.1UU SOPN INJECT 1.5 MG INTO THE SKIN ONCE A WEEK.   Vitamin D, Ergocalciferol, (DRISDOL) 1.25 MG (50000 UNIT) CAPS capsule Take 1 capsule on Wednesday and Sunday weekly   No facility-administered medications prior to visit.    Review of Systems Review of Systems:  A fourteen system review of systems was performed and found to be positive as per HPI.     Objective    BP 137/88    Pulse 78  Temp 97.6 F (36.4 C)    Ht 6\' 1"  (1.854 m)    Wt (!) 342 lb (155.1 kg)    SpO2 98%    BMI 45.12 kg/m    Physical Exam   General Appearance:    Alert, cooperative, in no acute distress, appears stated age  Head:    Normocephalic, without obvious abnormality, atraumatic  Eyes:    PERRL, conjunctiva/corneas clear, EOM's intact, fundi    benign, both eyes       Ears:    Normal TM's and external ear canals, both ears; dermatitis of external ears noted  Nose:   Nares normal, septum midline, mucosa normal, no drainage   or sinus tenderness  Throat:   Lips, mucosa, and tongue normal; gums normal, teeth fair  Neck:   Supple, symmetrical, trachea midline, no adenopathy;       thyroid:  No enlargement/tenderness/nodules; no JVD  Back:     Symmetric, no curvature, ROM normal, no CVA tenderness  Lungs:     Clear to auscultation  bilaterally, respirations unlabored  Chest wall:    No tenderness or deformity  Heart:    Normal heart rate. Normal rhythm. No murmurs, rubs, or gallops.  S1 and S2 normal  Abdomen:     Protuberant, non-tender, bowel sounds active all four quadrants,    no masses, no organomegaly  Genitalia:    deferred  Rectal:    deferred  Extremities:   All extremities are intact. No cyanosis or edema  Pulses:   2+ and symmetric all extremities  Skin:   Skin color, texture, turgor normal, no rashes or lesions  Lymph nodes:   Cervical and supraclavicular nodes normal.  Neurologic:   CNII-XII grossly intact.     Last depression screening scores PHQ 2/9 Scores 06/12/2021 02/06/2021 11/06/2020  PHQ - 2 Score 0 0 0  PHQ- 9 Score 2 2 2    Last fall risk screening Fall Risk  06/12/2021  Falls in the past year? 0  Number falls in past yr: 0  Injury with Fall? 0  Risk for fall due to : No Fall Risks  Follow up Falls evaluation completed     Results for orders placed or performed in visit on 06/12/21  POCT glycosylated hemoglobin (Hb A1C)  Result Value Ref Range   Hemoglobin A1C 6.3 (A) 4.0 - 5.6 %   HbA1c POC (<> result, manual entry)     HbA1c, POC (prediabetic range)     HbA1c, POC (controlled diabetic range)      Assessment & Plan    Routine Health Maintenance and Physical Exam  Exercise Activities and Dietary recommendations -Discussed heart healthy diet low in fat and carbohydrates.   Immunization History  Administered Date(s) Administered   Influenza,inj,Quad PF,6+ Mos 03/15/2019   PFIZER(Purple Top)SARS-COV-2 Vaccination 01/15/2020, 02/05/2020   Tdap 06/12/2021   Zoster Recombinat (Shingrix) 06/12/2021    Health Maintenance  Topic Date Due   Hepatitis C Screening  Never done   COLONOSCOPY (Pts 45-27yrs Insurance coverage will need to be confirmed)  Never done   FOOT EXAM  12/18/2018   COVID-19 Vaccine (3 - Pfizer risk series) 03/04/2020   INFLUENZA VACCINE  07/25/2021  (Originally 11/25/2020)   Zoster Vaccines- Shingrix (2 of 2) 08/07/2021   HEMOGLOBIN A1C  12/10/2021   OPHTHALMOLOGY EXAM  02/17/2022   TETANUS/TDAP  06/13/2031   HIV Screening  Completed   HPV VACCINES  Aged Out    Discussed health benefits of physical activity, and  encouraged him to engage in regular exercise appropriate for his age and condition.  Problem List Items Addressed This Visit       Cardiovascular and Mediastinum   Hypertension associated with diabetes ([place]) (Chronic)   Relevant Orders   CBC with Differential/Platelet     Endocrine   Diabetes mellitus (HCC) (Chronic)   Relevant Orders   Comprehensive metabolic panel   POCT glycosylated hemoglobin (Hb A1C) (Completed)   Mixed diabetic hyperlipidemia associated with type 2 diabetes mellitus (HCC) (Chronic)   Relevant Orders   Lipid panel     Other   Morbid obesity ([place])-  BMI 46.2 (Chronic)   Other Visit Diagnoses     Healthcare maintenance    -  Primary   Screening for endocrine, nutritional, metabolic and immunity disorder       Relevant Orders   Comprehensive metabolic panel   CBC with Differential/Platelet   Lipid panel   TSH   Need for Tdap vaccination       Relevant Orders   Tdap vaccine greater than or equal to 7yo IM (Completed)   Need for shingles vaccine       Relevant Orders   Varicella-zoster vaccine IM (Completed)   Screening for colon cancer       Relevant Orders   Ambulatory referral to Gastroenterology   Screening for prostate cancer       Relevant Orders   PSA      -Will obtain routine fasting labs. Pt prefers PSA for prostate cancer screening, declined DRE. -Pt is agreeable to colonoscopy, Tdap and Shingrix. Declined Hep C screening. -A1c has improved from 7.6 to 6.3. -Praised for 8 pound wt loss. Encourage to continue weight loss efforts with diet changes. -Continue current medication regimen.  Return in about 4 months (around 10/10/2021) for DM. HTN, HLD, second Shingrix .        Lorrene Reid, PA-C  [facility] [phone] (phone) [phone] (fax)  [place]

## 2021-06-12 ENCOUNTER — Encounter: Payer: Self-pay | Admitting: Physician Assistant

## 2021-06-12 ENCOUNTER — Other Ambulatory Visit: Payer: Self-pay

## 2021-06-12 ENCOUNTER — Ambulatory Visit (INDEPENDENT_AMBULATORY_CARE_PROVIDER_SITE_OTHER): Payer: 59 | Admitting: Physician Assistant

## 2021-06-12 VITALS — BP 137/88 | HR 78 | Temp 97.6°F | Ht 73.0 in | Wt 342.0 lb

## 2021-06-12 DIAGNOSIS — Z13228 Encounter for screening for other metabolic disorders: Secondary | ICD-10-CM

## 2021-06-12 DIAGNOSIS — Z0000 Encounter for general adult medical examination without abnormal findings: Secondary | ICD-10-CM | POA: Diagnosis not present

## 2021-06-12 DIAGNOSIS — E1159 Type 2 diabetes mellitus with other circulatory complications: Secondary | ICD-10-CM

## 2021-06-12 DIAGNOSIS — Z1211 Encounter for screening for malignant neoplasm of colon: Secondary | ICD-10-CM

## 2021-06-12 DIAGNOSIS — Z23 Encounter for immunization: Secondary | ICD-10-CM | POA: Diagnosis not present

## 2021-06-12 DIAGNOSIS — I152 Hypertension secondary to endocrine disorders: Secondary | ICD-10-CM

## 2021-06-12 DIAGNOSIS — Z1329 Encounter for screening for other suspected endocrine disorder: Secondary | ICD-10-CM | POA: Diagnosis not present

## 2021-06-12 DIAGNOSIS — E782 Mixed hyperlipidemia: Secondary | ICD-10-CM

## 2021-06-12 DIAGNOSIS — Z130 Encounter for screening for diseases of the blood and blood-forming organs and certain disorders involving the immune mechanism: Secondary | ICD-10-CM

## 2021-06-12 DIAGNOSIS — E1169 Type 2 diabetes mellitus with other specified complication: Secondary | ICD-10-CM | POA: Diagnosis not present

## 2021-06-12 DIAGNOSIS — Z1321 Encounter for screening for nutritional disorder: Secondary | ICD-10-CM

## 2021-06-12 DIAGNOSIS — Z125 Encounter for screening for malignant neoplasm of prostate: Secondary | ICD-10-CM

## 2021-06-12 LAB — POCT GLYCOSYLATED HEMOGLOBIN (HGB A1C): Hemoglobin A1C: 6.3 % — AB (ref 4.0–5.6)

## 2021-06-12 NOTE — Patient Instructions (Signed)

## 2021-06-13 LAB — CBC WITH DIFFERENTIAL/PLATELET
Basophils Absolute: 0.1 10*3/uL (ref 0.0–0.2)
Basos: 1 %
EOS (ABSOLUTE): 0.2 10*3/uL (ref 0.0–0.4)
Eos: 3 %
Hematocrit: 46.8 % (ref 37.5–51.0)
Hemoglobin: 15.9 g/dL (ref 13.0–17.7)
Immature Grans (Abs): 0 10*3/uL (ref 0.0–0.1)
Immature Granulocytes: 0 %
Lymphocytes Absolute: 1.7 10*3/uL (ref 0.7–3.1)
Lymphs: 32 %
MCH: 29.4 pg (ref 26.6–33.0)
MCHC: 34 g/dL (ref 31.5–35.7)
MCV: 87 fL (ref 79–97)
Monocytes Absolute: 0.4 10*3/uL (ref 0.1–0.9)
Monocytes: 8 %
Neutrophils Absolute: 3.2 10*3/uL (ref 1.4–7.0)
Neutrophils: 56 %
Platelets: 188 10*3/uL (ref 150–450)
RBC: 5.4 x10E6/uL (ref 4.14–5.80)
RDW: 12.5 % (ref 11.6–15.4)
WBC: 5.5 10*3/uL (ref 3.4–10.8)

## 2021-06-13 LAB — COMPREHENSIVE METABOLIC PANEL
ALT: 33 IU/L (ref 0–44)
AST: 33 IU/L (ref 0–40)
Albumin/Globulin Ratio: 1.4 (ref 1.2–2.2)
Albumin: 4.5 g/dL (ref 3.8–4.9)
Alkaline Phosphatase: 72 IU/L (ref 44–121)
BUN/Creatinine Ratio: 20 (ref 9–20)
BUN: 19 mg/dL (ref 6–24)
Bilirubin Total: 0.7 mg/dL (ref 0.0–1.2)
CO2: 25 mmol/L (ref 20–29)
Calcium: 9.7 mg/dL (ref 8.7–10.2)
Chloride: 100 mmol/L (ref 96–106)
Creatinine, Ser: 0.93 mg/dL (ref 0.76–1.27)
Globulin, Total: 3.3 g/dL (ref 1.5–4.5)
Glucose: 103 mg/dL — ABNORMAL HIGH (ref 70–99)
Potassium: 4.4 mmol/L (ref 3.5–5.2)
Sodium: 139 mmol/L (ref 134–144)
Total Protein: 7.8 g/dL (ref 6.0–8.5)
eGFR: 97 mL/min/{1.73_m2} (ref >59)

## 2021-06-13 LAB — LIPID PANEL
Chol/HDL Ratio: 4.3 ratio (ref 0.0–5.0)
Cholesterol, Total: 162 mg/dL (ref 100–199)
HDL: 38 mg/dL — ABNORMAL LOW (ref >39)
LDL Chol Calc (NIH): 105 mg/dL — ABNORMAL HIGH (ref 0–99)
Triglycerides: 105 mg/dL (ref 0–149)
VLDL Cholesterol Cal: 19 mg/dL (ref 5–40)

## 2021-06-13 LAB — PSA: Prostate Specific Ag, Serum: 0.3 ng/mL (ref 0.0–4.0)

## 2021-06-13 LAB — TSH: TSH: 1.63 u[IU]/mL (ref 0.450–4.500)

## 2021-06-15 ENCOUNTER — Other Ambulatory Visit: Payer: Self-pay | Admitting: Physician Assistant

## 2021-06-15 DIAGNOSIS — E559 Vitamin D deficiency, unspecified: Secondary | ICD-10-CM

## 2021-06-16 ENCOUNTER — Other Ambulatory Visit: Payer: Self-pay | Admitting: Physician Assistant

## 2021-06-16 DIAGNOSIS — M2550 Pain in unspecified joint: Secondary | ICD-10-CM

## 2021-06-19 ENCOUNTER — Encounter: Payer: Self-pay | Admitting: Gastroenterology

## 2021-07-04 ENCOUNTER — Other Ambulatory Visit: Payer: Self-pay

## 2021-07-04 ENCOUNTER — Ambulatory Visit (AMBULATORY_SURGERY_CENTER): Payer: 59 | Admitting: *Deleted

## 2021-07-04 VITALS — Ht 73.0 in | Wt 340.0 lb

## 2021-07-04 DIAGNOSIS — Z1211 Encounter for screening for malignant neoplasm of colon: Secondary | ICD-10-CM

## 2021-07-04 MED ORDER — NA SULFATE-K SULFATE-MG SULF 17.5-3.13-1.6 GM/177ML PO SOLN: 1.0000 | Freq: Once | ORAL | 0 refills | Status: AC

## 2021-07-04 NOTE — Progress Notes (Signed)

## 2021-07-18 ENCOUNTER — Encounter: Payer: Self-pay | Admitting: Certified Registered Nurse Anesthetist

## 2021-07-18 ENCOUNTER — Other Ambulatory Visit: Payer: Self-pay | Admitting: Physician Assistant

## 2021-07-18 DIAGNOSIS — M2550 Pain in unspecified joint: Secondary | ICD-10-CM

## 2021-07-21 ENCOUNTER — Other Ambulatory Visit: Payer: Self-pay | Admitting: Physician Assistant

## 2021-07-21 DIAGNOSIS — E1159 Type 2 diabetes mellitus with other circulatory complications: Secondary | ICD-10-CM

## 2021-07-22 ENCOUNTER — Encounter: Payer: Self-pay | Admitting: Gastroenterology

## 2021-07-25 ENCOUNTER — Ambulatory Visit (AMBULATORY_SURGERY_CENTER): Payer: 59 | Admitting: Gastroenterology

## 2021-07-25 ENCOUNTER — Encounter: Payer: Self-pay | Admitting: Gastroenterology

## 2021-07-25 VITALS — BP 115/71 | HR 83 | Temp 98.0°F | Resp 11 | Ht 73.0 in | Wt 340.0 lb

## 2021-07-25 DIAGNOSIS — D125 Benign neoplasm of sigmoid colon: Secondary | ICD-10-CM | POA: Diagnosis not present

## 2021-07-25 DIAGNOSIS — D122 Benign neoplasm of ascending colon: Secondary | ICD-10-CM | POA: Diagnosis not present

## 2021-07-25 DIAGNOSIS — D123 Benign neoplasm of transverse colon: Secondary | ICD-10-CM | POA: Diagnosis not present

## 2021-07-25 DIAGNOSIS — Z1211 Encounter for screening for malignant neoplasm of colon: Secondary | ICD-10-CM | POA: Diagnosis present

## 2021-07-25 MED ORDER — SODIUM CHLORIDE 0.9 % IV SOLN: 500.0000 mL | Freq: Once | INTRAVENOUS | Status: DC

## 2021-07-25 NOTE — Op Note (Signed)
[place] ?Patient Name: Ricardo Wolf ?Procedure Date: 07/25/2021 2:22 PM ?MRN: [id number] ?Endoscopist: Milus Banister , MD ?Age: 56 ?Referring MD:  ?Date of Birth: 10-Sep-1965 ?Gender: Male ?Account #: [id number] ?Procedure:                Colonoscopy ?Indications:              Screening for colorectal malignant neoplasm ?Medicines:                Monitored Anesthesia Care ?Procedure:                Pre-Anesthesia Assessment: ?                          - Prior to the procedure, a History and Physical  ?                          was performed, and patient medications and  ?                          allergies were reviewed. The patient's tolerance of  ?                          previous anesthesia was also reviewed. The risks  ?                          and benefits of the procedure and the sedation  ?                          options and risks were discussed with the patient.  ?                          All questions were answered, and informed consent  ?                          was obtained. Prior Anticoagulants: The patient has  ?                          taken no previous anticoagulant or antiplatelet  ?                          agents. ASA Grade Assessment: III - A patient with  ?                          severe systemic disease. After reviewing the risks  ?                          and benefits, the patient was deemed in  ?                          satisfactory condition to undergo the procedure. ?                          After obtaining informed consent, the colonoscope  ?  was passed under direct vision. Throughout the  ?                          procedure, the patient's blood pressure, pulse, and  ?                          oxygen saturations were monitored continuously. The  ?                          Olympus CF-[id number] (#[id number]) Colonoscope was  ?                          introduced through the anus and advanced to the the  ?                          cecum, identified  by appendiceal orifice and  ?                          ileocecal valve. The colonoscopy was performed  ?                          without difficulty. The patient tolerated the  ?                          procedure well. The quality of the bowel  ?                          preparation was good. The ileocecal valve,  ?                          appendiceal orifice, and rectum were photographed. ?Scope In: 2:27:32 PM ?Scope Out: 2:46:32 PM ?Scope Withdrawal Time: 0 hours 11 minutes 52 seconds  ?Total Procedure Duration: 0 hours 19 minutes 0 seconds  ?Findings:                 Seven sessile polyps were found in the sigmoid  ?                          colon, transverse colon and ascending colon. The  ?                          polyps were 2 to 5 mm in size. These polyps were  ?                          removed with a cold snare. Resection and retrieval  ?                          were complete. ?                          Internal hemorrhoids were found. The hemorrhoids  ?                          were small. ?  The exam was otherwise without abnormality on  ?                          direct and retroflexion views. ?Complications:            No immediate complications. Estimated blood loss:  ?                          None. ?Estimated Blood Loss:     Estimated blood loss: none. ?Impression:               - Seven 2 to 5 mm polyps in the sigmoid colon, in  ?                          the transverse colon and in the ascending colon,  ?                          removed with a cold snare. Resected and retrieved. ?                          - Internal hemorrhoids. ?                          - The examination was otherwise normal on direct  ?                          and retroflexion views. ?Recommendation:           - Patient has a contact number available for  ?                          emergencies. The signs and symptoms of potential  ?                          delayed complications were discussed with  the  ?                          patient. Return to normal activities tomorrow.  ?                          Written discharge instructions were provided to the  ?                          patient. ?                          - Resume previous diet. ?                          - Continue present medications. ?                          - Await pathology results. ?Milus Banister, MD ?07/25/2021 2:48:25 PM ?This report has been signed electronically. ?

## 2021-07-25 NOTE — Progress Notes (Signed)
Report given to PACU, vss 

## 2021-07-25 NOTE — Progress Notes (Signed)
HPI: ?This is a man at routine risk for CRC ? ? ?ROS: complete GI ROS as described in HPI, all other review negative. ? ?Constitutional:  No unintentional weight loss ? ? ?Past Medical History:  ?Diagnosis Date  ? Allergy   ? pollen  ? Arthritis   ? back,shoulders,knees  ? Diabetes ([place])   ? GERD (gastroesophageal reflux disease)   ? High cholesterol   ? Hypertension   ? ? ?Past Surgical History:  ?Procedure Laterality Date  ? BACK SURGERY  2003  ? BACK SURGERY  2011  ? BACK SURGERY    ? 2019  ? ? ?Current Outpatient Medications  ?Medication Sig Dispense Refill  ? Ez Smart Blood Glucose Lancets MISC Use to check fasting blood sugar 100 each 12  ? glucose blood test strip Use as instructed 100 each 12  ? linaGLIPtin-metFORMIN HCl (JENTADUETO) 2.08-998 MG TABS TAKE 1 TABLET BY MOUTH TWICE A DAY 60 tablet 5  ? lisinopril (ZESTRIL) 20 MG tablet TAKE 1 TABLET BY MOUTH EVERY DAY 30 tablet 2  ? meloxicam (MOBIC) 7.5 MG tablet TAKE 1 TABLET BY MOUTH EVERY DAY AS NEEDED FOR PAIN 30 tablet 0  ? metoprolol succinate (TOPROL-XL) 100 MG 24 hr tablet TAKE 1 TABLET BY MOUTH DAILY. TAKE WITH OR IMMEDIATELY FOLLOWING A MEAL. 30 tablet 2  ? pravastatin (PRAVACHOL) 20 MG tablet TAKE 1 TABLET BY MOUTH EVERY DAY 90 tablet 0  ? TRULICITY 1.5 KX/3.8HW SOPN INJECT 1.5 MG INTO THE SKIN ONCE A WEEK. 2 mL 2  ? Vitamin D, Ergocalciferol, (DRISDOL) 1.25 MG (50000 UNIT) CAPS capsule Take 1 capsule on Wednesday and Sunday weekly 8 capsule 3  ? gabapentin (NEURONTIN) 300 MG capsule Take 1 capsule (300 mg total) by mouth at bedtime. 90 capsule 0  ? ?Current Facility-Administered Medications  ?Medication Dose Route Frequency Provider Last Rate Last Admin  ? 0.9 %  sodium chloride infusion  500 mL Intravenous Once Milus Banister, MD      ? ? ?Allergies as of 07/25/2021 - Review Complete 07/25/2021  ?Allergen Reaction Noted  ? Keflet [cephalexin] Rash 11/10/2017  ? Penicillins Rash 11/10/2017  ? Sulfa antibiotics Rash 09/01/2013  ? ? ?Family History   ?Problem Relation Age of Onset  ? Diabetes Mother   ? Esophageal cancer Father   ? Lung cancer Father   ? Diabetes Sister   ? Colon polyps Paternal Aunt   ? Colon cancer Neg Hx   ? Rectal cancer Neg Hx   ? Stomach cancer Neg Hx   ? ? ?Social History  ? ?Socioeconomic History  ? Marital status: Married  ?  Spouse name: Not on file  ? Number of children: Not on file  ? Years of education: Not on file  ? Highest education level: Not on file  ?Occupational History  ? Not on file  ?Tobacco Use  ? Smoking status: Former  ?  Packs/day: 1.00  ?  Types: Cigarettes  ?  Quit date: 2007  ?  Years since quitting: 16.2  ?  Passive exposure: Current  ? Smokeless tobacco: Never  ?Vaping Use  ? Vaping Use: Never used  ?Substance and Sexual Activity  ? Alcohol use: Yes  ?  Alcohol/week: 2.0 standard drinks  ?  Types: 2 Standard drinks or equivalent per week  ?  Comment: "once per week"  ? Drug use: Never  ? Sexual activity: Yes  ?  Partners: Female  ?  Birth control/protection: None  ?Other Topics  Concern  ? Not on file  ?Social History Narrative  ? Not on file  ? ?Social Determinants of Health  ? ?Financial Resource Strain: Not on file  ?Food Insecurity: Not on file  ?Transportation Needs: Not on file  ?Physical Activity: Not on file  ?Stress: Not on file  ?Social Connections: Not on file  ?Intimate Partner Violence: Not on file  ? ? ? ?Physical Exam: ?BP (!) 139/91   Pulse 81   Temp 98 ?F (36.7 ?C) (Skin)   Ht '6\' 1"'$  (1.854 m)   Wt (!) 340 lb (154.2 kg)   SpO2 93%   BMI 44.86 kg/m?  ?Constitutional: generally well-appearing ?Psychiatric: alert and oriented x3 ?Lungs: CTA bilaterally ?Heart: no MCR ? ?Assessment and plan: ?56 y.o. male with routine risk for CRC ? ?Screening colonoscopy today ? ?Care is appropriate for the ambulatory setting. ? ?Owens Loffler, MD ?[facility] ?07/25/2021, 2:20 PM ? ? ? ?

## 2021-07-25 NOTE — Patient Instructions (Signed)
Thank you for letting us take care of your healthcare needs today. Please see handouts given to you on Polyps and Hemorrhoids.     YOU HAD AN ENDOSCOPIC PROCEDURE TODAY AT THE [facility]:   Refer to the procedure report that was given to you for any specific questions about what was found during the examination.  If the procedure report does not answer your questions, please call your gastroenterologist to clarify.  If you requested that your care partner not be given the details of your procedure findings, then the procedure report has been included in a sealed envelope for you to review at your convenience later.  YOU SHOULD EXPECT: Some feelings of bloating in the abdomen. Passage of more gas than usual.  Walking can help get rid of the air that was put into your GI tract during the procedure and reduce the bloating. If you had a lower endoscopy (such as a colonoscopy or flexible sigmoidoscopy) you may notice spotting of blood in your stool or on the toilet paper. If you underwent a bowel prep for your procedure, you may not have a normal bowel movement for a few days.  Please Note:  You might notice some irritation and congestion in your nose or some drainage.  This is from the oxygen used during your procedure.  There is no need for concern and it should clear up in a day or so.  SYMPTOMS TO REPORT IMMEDIATELY:  Following lower endoscopy (colonoscopy or flexible sigmoidoscopy):  Excessive amounts of blood in the stool  Significant tenderness or worsening of abdominal pains  Swelling of the abdomen that is new, acute  Fever of 100F or higher  For urgent or emergent issues, a gastroenterologist can be reached at any hour by calling [phone]. Do not use MyChart messaging for urgent concerns.    DIET:  We do recommend a small meal at first, but then you may proceed to your regular diet.  Drink plenty of fluids but you should avoid alcoholic beverages for 24  hours.  ACTIVITY:  You should plan to take it easy for the rest of today and you should NOT DRIVE or use heavy machinery until tomorrow (because of the sedation medicines used during the test).    FOLLOW UP: Our staff will call the number listed on your records 48-72 hours following your procedure to check on you and address any questions or concerns that you may have regarding the information given to you following your procedure. If we do not reach you, we will leave a message.  We will attempt to reach you two times.  During this call, we will ask if you have developed any symptoms of COVID 19. If you develop any symptoms (ie: fever, flu-like symptoms, shortness of breath, cough etc.) before then, please call [phone].  If you test positive for Covid 19 in the 2 weeks post procedure, please call and report this information to us.    If any biopsies were taken you will be contacted by phone or by letter within the next 1-3 weeks.  Please call us at [phone] if you have not heard about the biopsies in 3 weeks.    SIGNATURES/CONFIDENTIALITY: You and/or your care partner have signed paperwork which will be entered into your electronic medical record.  These signatures attest to the fact that that the information above on your After Visit Summary has been reviewed and is understood.  Full responsibility of the confidentiality of this discharge information   lies with you and/or your care-partner.  

## 2021-07-25 NOTE — Progress Notes (Signed)
Called to room to assist during endoscopic procedure.  Patient ID and intended procedure confirmed with present staff. Received instructions for my participation in the procedure from the performing physician.  

## 2021-07-25 NOTE — Progress Notes (Signed)
Pt's states no medical or surgical changes since previsit or office visit. 

## 2021-07-29 ENCOUNTER — Telehealth: Payer: Self-pay

## 2021-07-29 NOTE — Telephone Encounter (Signed)
Left message on answering machine. 

## 2021-08-03 ENCOUNTER — Encounter: Payer: Self-pay | Admitting: Gastroenterology

## 2021-08-16 ENCOUNTER — Other Ambulatory Visit: Payer: Self-pay | Admitting: Physician Assistant

## 2021-08-16 DIAGNOSIS — E1169 Type 2 diabetes mellitus with other specified complication: Secondary | ICD-10-CM

## 2021-08-20 ENCOUNTER — Other Ambulatory Visit: Payer: Self-pay | Admitting: Physician Assistant

## 2021-08-20 DIAGNOSIS — E1169 Type 2 diabetes mellitus with other specified complication: Secondary | ICD-10-CM

## 2021-08-23 ENCOUNTER — Other Ambulatory Visit: Payer: Self-pay | Admitting: Physician Assistant

## 2021-08-23 DIAGNOSIS — M2550 Pain in unspecified joint: Secondary | ICD-10-CM

## 2021-09-30 ENCOUNTER — Other Ambulatory Visit: Payer: Self-pay | Admitting: Physician Assistant

## 2021-09-30 DIAGNOSIS — M2550 Pain in unspecified joint: Secondary | ICD-10-CM

## 2021-09-30 DIAGNOSIS — M5416 Radiculopathy, lumbar region: Secondary | ICD-10-CM

## 2021-10-10 ENCOUNTER — Ambulatory Visit (INDEPENDENT_AMBULATORY_CARE_PROVIDER_SITE_OTHER): Payer: 59 | Admitting: Physician Assistant

## 2021-10-10 ENCOUNTER — Encounter: Payer: Self-pay | Admitting: Physician Assistant

## 2021-10-10 VITALS — BP 134/80 | HR 84 | Temp 97.7°F | Ht 73.0 in | Wt 342.0 lb

## 2021-10-10 DIAGNOSIS — E1129 Type 2 diabetes mellitus with other diabetic kidney complication: Secondary | ICD-10-CM | POA: Diagnosis not present

## 2021-10-10 DIAGNOSIS — E782 Mixed hyperlipidemia: Secondary | ICD-10-CM

## 2021-10-10 DIAGNOSIS — Z23 Encounter for immunization: Secondary | ICD-10-CM

## 2021-10-10 DIAGNOSIS — E1159 Type 2 diabetes mellitus with other circulatory complications: Secondary | ICD-10-CM | POA: Diagnosis not present

## 2021-10-10 DIAGNOSIS — M5416 Radiculopathy, lumbar region: Secondary | ICD-10-CM

## 2021-10-10 DIAGNOSIS — R809 Proteinuria, unspecified: Secondary | ICD-10-CM

## 2021-10-10 DIAGNOSIS — E1169 Type 2 diabetes mellitus with other specified complication: Secondary | ICD-10-CM | POA: Diagnosis not present

## 2021-10-10 DIAGNOSIS — I152 Hypertension secondary to endocrine disorders: Secondary | ICD-10-CM

## 2021-10-10 LAB — POCT GLYCOSYLATED HEMOGLOBIN (HGB A1C): Hemoglobin A1C: 6.1 % — AB (ref 4.0–5.6)

## 2021-10-10 MED ORDER — JENTADUETO 2.5-1000 MG PO TABS: ORAL_TABLET | ORAL | 1 refills | Status: DC

## 2021-10-10 MED ORDER — LISINOPRIL 20 MG PO TABS: 20.0000 mg | ORAL_TABLET | Freq: Every day | ORAL | 1 refills | Status: DC

## 2021-10-10 MED ORDER — PRAVASTATIN SODIUM 20 MG PO TABS: 20.0000 mg | ORAL_TABLET | Freq: Every day | ORAL | 1 refills | Status: DC

## 2021-10-10 MED ORDER — METOPROLOL SUCCINATE ER 100 MG PO TB24: 100.0000 mg | ORAL_TABLET | Freq: Every day | ORAL | 1 refills | Status: DC

## 2021-10-10 NOTE — Patient Instructions (Signed)

## 2021-10-10 NOTE — Assessment & Plan Note (Signed)
-  A1c has improved from 6.3 to 6.1, will continue current medication regimen. See med list.  -Recommend to resume ambulatory glucose monitoring.  -Discussed diabetic diet. -Will continue to monitor.

## 2021-10-10 NOTE — Assessment & Plan Note (Signed)
-  Stable. Continue current medication regimen. Will continue to monitor. Will collect CMP for medication monitoring.  

## 2021-10-10 NOTE — Assessment & Plan Note (Signed)
-  Last lipid panel LDL 105, will repeat lipid panel today. Discussed with patient improving medication compliance and avoiding missing doses. Discussed adequate hydration and low fat diet. Will continue to monitor.

## 2021-10-10 NOTE — Progress Notes (Signed)
Established patient visit   Patient: Ricardo Wolf   DOB: [birth year]   56 y.o. Male  MRN: [id number] Visit Date: 10/10/2021  Chief Complaint  Patient presents with   Diabetes   Subjective    HPI  Patient presents for chronic follow-up. Patient has no acute concerns. Reports takes Meloxicam only if having significant joint pain. Takes Gabapentin as needed too.   Diabetes mellitus: Pt denies increased urination or thirst. Pt reports medication compliance. No hypoglycemic events. Not checking sugars at home. Trying to stay as active as possible. Continues to monitor carbohydrate intake.   HTN: Pt denies chest pain, palpitations, dizziness or lower extremity swelling. Taking medication as directed without side effects.   HLD: Pt reports missing several doses of cholesterol medication. Tries to limit eating out.    Medications: Outpatient Medications Prior to Visit  Medication Sig   Ez Smart Blood Glucose Lancets MISC Use to check fasting blood sugar   gabapentin (NEURONTIN) 300 MG capsule TAKE 1 CAPSULE BY MOUTH EVERYDAY AT BEDTIME   glucose blood test strip Use as instructed   meloxicam (MOBIC) 7.5 MG tablet TAKE 1 TABLET BY MOUTH EVERY DAY AS NEEDED FOR PAIN   TRULICITY 1.5 WU/1.3KG SOPN INJECT 1.5 MG INTO THE SKIN ONCE A WEEK.   Vitamin D, Ergocalciferol, (DRISDOL) 1.25 MG (50000 UNIT) CAPS capsule Take 1 capsule on Wednesday and Sunday weekly   [DISCONTINUED] linaGLIPtin-metFORMIN HCl (JENTADUETO) 2.08-998 MG TABS TAKE 1 TABLET BY MOUTH TWICE A DAY   [DISCONTINUED] lisinopril (ZESTRIL) 20 MG tablet TAKE 1 TABLET BY MOUTH EVERY DAY   [DISCONTINUED] metoprolol succinate (TOPROL-XL) 100 MG 24 hr tablet TAKE 1 TABLET BY MOUTH DAILY. TAKE WITH OR IMMEDIATELY FOLLOWING A MEAL.   [DISCONTINUED] pravastatin (PRAVACHOL) 20 MG tablet TAKE 1 TABLET BY MOUTH EVERY DAY   No facility-administered medications prior to visit.    Review of Systems Review of Systems:  A fourteen system review  of systems was performed and found to be positive as per HPI.     Objective    BP 134/80   Pulse 84   Temp 97.7 F (36.5 C)   Ht _0  (1.854 m)   Wt (!) 342 lb (155.1 kg)   SpO2 96%   BMI 45.12 kg/m  BP Readings from Last 3 Encounters:  10/10/21 134/80  07/25/21 115/71  06/12/21 137/88   Wt Readings from Last 3 Encounters:  10/10/21 (!) 342 lb (155.1 kg)  07/25/21 (!) 340 lb (154.2 kg)  07/04/21 (!) 340 lb (154.2 kg)    Physical Exam  General:  Cooperative,  appropriate for stated age.  Neuro:  Alert and oriented,  extra-ocular muscles intact  HEENT:  Normocephalic, atraumatic, neck supple  Skin:  no gross rash, warm, pink. Cardiac:  RRR, S1 S2 Respiratory: CTA B/L  Vascular:  Ext warm, no cyanosis apprec.; cap RF less 2 sec. Psych:  No HI/SI, judgement and insight good, Euthymic mood. Full Affect.   Results for orders placed or performed in visit on 10/10/21  POCT glycosylated hemoglobin (Hb A1C)  Result Value Ref Range   Hemoglobin A1C 6.1 (A) 4.0 - 5.6 %   HbA1c POC (<> result, manual entry)     HbA1c, POC (prediabetic range)     HbA1c, POC (controlled diabetic range)      Assessment & Plan      Problem List Items Addressed This Visit       Cardiovascular and Mediastinum   Hypertension associated with diabetes ([place]) (Chronic)    -  Stable. -Continue current medication regimen. -Will continue to monitor. -Will collect CMP for medication monitoring.      Relevant Medications   linaGLIPtin-metFORMIN HCl (JENTADUETO) 2.08-998 MG TABS   lisinopril (ZESTRIL) 20 MG tablet   metoprolol succinate (TOPROL-XL) 100 MG 24 hr tablet   pravastatin (PRAVACHOL) 20 MG tablet   Other Relevant Orders   CBC w/Diff   Comp Met (CMET)     Endocrine   Diabetes mellitus (HCC) - Primary (Chronic)    -A1c has improved from 6.3 to 6.1, will continue current medication regimen. See med list.  -Recommend to resume ambulatory glucose monitoring.  -Discussed diabetic  diet. -Will continue to monitor.      Relevant Medications   linaGLIPtin-metFORMIN HCl (JENTADUETO) 2.08-998 MG TABS   lisinopril (ZESTRIL) 20 MG tablet   pravastatin (PRAVACHOL) 20 MG tablet   Other Relevant Orders   POCT glycosylated hemoglobin (Hb A1C) (Completed)   Urine Microalbumin w/creat. ratio   CBC w/Diff   Comp Met (CMET)   Mixed diabetic hyperlipidemia associated with type 2 diabetes mellitus (HCC) (Chronic)    -Last lipid panel LDL 105, will repeat lipid panel today. Discussed with patient improving medication compliance and avoiding missing doses. Discussed adequate hydration and low fat diet. Will continue to monitor.      Relevant Medications   linaGLIPtin-metFORMIN HCl (JENTADUETO) 2.08-998 MG TABS   lisinopril (ZESTRIL) 20 MG tablet   metoprolol succinate (TOPROL-XL) 100 MG 24 hr tablet   pravastatin (PRAVACHOL) 20 MG tablet   Other Relevant Orders   Lipid Profile   Microalbuminuria due to type 2 diabetes mellitus (HCC) (Chronic)    -Stable. Will obtain urine microalbumin/cr ration. Patient is on ACEi therapy. Will continue to monitor.      Relevant Medications   linaGLIPtin-metFORMIN HCl (JENTADUETO) 2.08-998 MG TABS   lisinopril (ZESTRIL) 20 MG tablet   pravastatin (PRAVACHOL) 20 MG tablet   Other Visit Diagnoses     Need for shingles vaccine       Relevant Orders   Varicella-zoster vaccine IM (Completed)   Left lumbar radiculopathy          Left lumbar radiculopathy: -Stable. Continue Gabapentin 300 mg at bedtime as needed. Will continue to monitor.  Return in about 4 months (around 02/09/2022) for DM, HTN, HLD.        Lorrene Reid, PA-C  [facility] at [facility] [phone] (phone) [phone] (fax)  [place]

## 2021-10-10 NOTE — Assessment & Plan Note (Signed)
-  Stable. Will obtain urine microalbumin/cr ration. Patient is on ACEi therapy. Will continue to monitor.

## 2021-10-11 LAB — COMPREHENSIVE METABOLIC PANEL
ALT: 26 IU/L (ref 0–44)
AST: 28 IU/L (ref 0–40)
Albumin/Globulin Ratio: 1.6 (ref 1.2–2.2)
Albumin: 4.6 g/dL (ref 3.8–4.9)
Alkaline Phosphatase: 70 IU/L (ref 44–121)
BUN/Creatinine Ratio: 24 — ABNORMAL HIGH (ref 9–20)
BUN: 21 mg/dL (ref 6–24)
Bilirubin Total: 0.3 mg/dL (ref 0.0–1.2)
CO2: 22 mmol/L (ref 20–29)
Calcium: 9.3 mg/dL (ref 8.7–10.2)
Chloride: 102 mmol/L (ref 96–106)
Creatinine, Ser: 0.88 mg/dL (ref 0.76–1.27)
Globulin, Total: 2.9 g/dL (ref 1.5–4.5)
Glucose: 145 mg/dL — ABNORMAL HIGH (ref 70–99)
Potassium: 4.4 mmol/L (ref 3.5–5.2)
Sodium: 138 mmol/L (ref 134–144)
Total Protein: 7.5 g/dL (ref 6.0–8.5)
eGFR: 102 mL/min/{1.73_m2} (ref >59)

## 2021-10-11 LAB — CBC WITH DIFFERENTIAL/PLATELET
Basophils Absolute: 0.1 10*3/uL (ref 0.0–0.2)
Basos: 1 %
EOS (ABSOLUTE): 0.2 10*3/uL (ref 0.0–0.4)
Eos: 3 %
Hematocrit: 47.8 % (ref 37.5–51.0)
Hemoglobin: 15.8 g/dL (ref 13.0–17.7)
Immature Grans (Abs): 0 10*3/uL (ref 0.0–0.1)
Immature Granulocytes: 0 %
Lymphocytes Absolute: 1.9 10*3/uL (ref 0.7–3.1)
Lymphs: 33 %
MCH: 29.7 pg (ref 26.6–33.0)
MCHC: 33.1 g/dL (ref 31.5–35.7)
MCV: 90 fL (ref 79–97)
Monocytes Absolute: 0.4 10*3/uL (ref 0.1–0.9)
Monocytes: 8 %
Neutrophils Absolute: 3.2 10*3/uL (ref 1.4–7.0)
Neutrophils: 55 %
Platelets: 178 10*3/uL (ref 150–450)
RBC: 5.32 x10E6/uL (ref 4.14–5.80)
RDW: 13.3 % (ref 11.6–15.4)
WBC: 5.8 10*3/uL (ref 3.4–10.8)

## 2021-10-11 LAB — LIPID PANEL
Chol/HDL Ratio: 3.9 ratio (ref 0.0–5.0)
Cholesterol, Total: 173 mg/dL (ref 100–199)
HDL: 44 mg/dL (ref >39)
LDL Chol Calc (NIH): 104 mg/dL — ABNORMAL HIGH (ref 0–99)
Triglycerides: 140 mg/dL (ref 0–149)
VLDL Cholesterol Cal: 25 mg/dL (ref 5–40)

## 2021-10-11 LAB — MICROALBUMIN / CREATININE URINE RATIO
Creatinine, Urine: 166.8 mg/dL
Microalb/Creat Ratio: 39 mg/g creat — ABNORMAL HIGH (ref 0–29)
Microalbumin, Urine: 64.3 ug/mL

## 2021-11-01 ENCOUNTER — Other Ambulatory Visit: Payer: Self-pay | Admitting: Physician Assistant

## 2021-11-01 DIAGNOSIS — M2550 Pain in unspecified joint: Secondary | ICD-10-CM

## 2021-11-06 ENCOUNTER — Ambulatory Visit: Payer: 59 | Admitting: Nurse Practitioner

## 2021-11-10 ENCOUNTER — Encounter: Payer: Self-pay | Admitting: Nurse Practitioner

## 2021-11-10 ENCOUNTER — Ambulatory Visit (INDEPENDENT_AMBULATORY_CARE_PROVIDER_SITE_OTHER): Payer: 59 | Admitting: Nurse Practitioner

## 2021-11-10 VITALS — BP 124/79 | HR 86 | Ht 73.0 in | Wt 340.8 lb

## 2021-11-10 DIAGNOSIS — M5416 Radiculopathy, lumbar region: Secondary | ICD-10-CM

## 2021-11-10 DIAGNOSIS — M5432 Sciatica, left side: Secondary | ICD-10-CM

## 2021-11-10 DIAGNOSIS — M5431 Sciatica, right side: Secondary | ICD-10-CM

## 2021-11-10 MED ORDER — METHYLPREDNISOLONE 4 MG PO TBPK: ORAL_TABLET | ORAL | 0 refills | Status: DC

## 2021-11-10 MED ORDER — GABAPENTIN 300 MG PO CAPS: 300.0000 mg | ORAL_CAPSULE | Freq: Two times a day (BID) | ORAL | 1 refills | Status: DC

## 2021-11-10 NOTE — Progress Notes (Signed)
Established patient visit   Patient: Ricardo Wolf   DOB: 1965/07/11   56 y.o. Male  MRN: [id number] Visit Date: 11/10/2021  Chief Complaint  Patient presents with   Back Pain   Hip Pain   Subjective    HPI  Patient having low back pain with sciatica  -has been going on for a few weeks.  -has had three back surgeries In the past.  -does get sharp pains in the groin and hip, bilaterally. Pain can be so bad that it is crippling.  --had pain meds and muscle relaxer left from his most recent back surgery which was 2020. This eased the pain enough so that he could go to work.   Medications: Outpatient Medications Prior to Visit  Medication Sig   Ez Smart Blood Glucose Lancets MISC Use to check fasting blood sugar   glucose blood test strip Use as instructed   linaGLIPtin-metFORMIN HCl (JENTADUETO) 2.08-998 MG TABS TAKE 1 TABLET BY MOUTH TWICE A DAY   lisinopril (ZESTRIL) 20 MG tablet Take 1 tablet (20 mg total) by mouth daily.   meloxicam (MOBIC) 7.5 MG tablet TAKE 1 TABLET BY MOUTH EVERY DAY AS NEEDED FOR PAIN   metoprolol succinate (TOPROL-XL) 100 MG 24 hr tablet Take 1 tablet (100 mg total) by mouth daily. TAKE WITH OR IMMEDIATELY FOLLOWING A MEAL.   pravastatin (PRAVACHOL) 20 MG tablet Take 1 tablet (20 mg total) by mouth daily.   TRULICITY 1.5 VV/6.1YW SOPN INJECT 1.5 MG INTO THE SKIN ONCE A WEEK.   Vitamin D, Ergocalciferol, (DRISDOL) 1.25 MG (50000 UNIT) CAPS capsule Take 1 capsule on Wednesday and Sunday weekly   [DISCONTINUED] gabapentin (NEURONTIN) 300 MG capsule TAKE 1 CAPSULE BY MOUTH EVERYDAY AT BEDTIME   No facility-administered medications prior to visit.    Review of Systems  Constitutional:  Negative for activity change, chills, fatigue and fever.  HENT:  Negative for congestion, postnasal drip, rhinorrhea, sinus pressure, sinus pain, sneezing and sore throat.   Eyes: Negative.   Respiratory:  Negative for cough, shortness of breath and wheezing.   Cardiovascular:   Negative for chest pain and palpitations.  Gastrointestinal:  Negative for constipation, diarrhea, nausea and vomiting.  Endocrine: Negative for cold intolerance, heat intolerance, polydipsia and polyuria.  Genitourinary:  Negative for dysuria, frequency and urgency.  Musculoskeletal:  Positive for arthralgias, back pain and myalgias.  Skin:  Negative for rash.  Allergic/Immunologic: Negative for environmental allergies.  Neurological:  Negative for dizziness, weakness and headaches.  Psychiatric/Behavioral:  The patient is not nervous/anxious.      Objective     Today's Vitals   11/10/21 1609  BP: 124/79  Pulse: 86  SpO2: 95%  Weight: (!) 340 lb 12.8 oz (154.6 kg)   Body mass index is 44.96 kg/m.   Wt Readings from Last 3 Encounters:  11/10/21 (!) 340 lb 12.8 oz (154.6 kg)  10/10/21 (!) 342 lb (155.1 kg)  07/25/21 (!) 340 lb (154.2 kg)     Physical Exam Vitals and nursing note reviewed.  Constitutional:      Appearance: Normal appearance. He is well-developed.  HENT:     Head: Normocephalic and atraumatic.  Eyes:     Pupils: Pupils are equal, round, and reactive to light.  Cardiovascular:     Rate and Rhythm: Normal rate and regular rhythm.     Pulses: Normal pulses.     Heart sounds: Normal heart sounds.  Pulmonary:     Effort: Pulmonary effort is normal.  Breath sounds: Normal breath sounds.  Abdominal:     Palpations: Abdomen is soft.  Musculoskeletal:        General: Normal range of motion.     Cervical back: Normal range of motion and neck supple.     Comments: Pain in lower back which radiates into the left hip and left upper leg. No bony abnormalities or deformities are noted at this time.   Lymphadenopathy:     Cervical: No cervical adenopathy.  Skin:    General: Skin is warm and dry.     Capillary Refill: Capillary refill takes less than 2 seconds.  Neurological:     General: No focal deficit present.     Mental Status: He is alert and oriented  to person, place, and time.  Psychiatric:        Mood and Affect: Mood normal.        Behavior: Behavior normal.        Thought Content: Thought content normal.        Judgment: Judgment normal.       Assessment & Plan     1. Bilateral sciatica Medrol taper started.  Take as directed for 6 days.  Advised patient to monitor blood sugars very carefully as steroid taper can increase blood sugars.  Patient voices understanding of instructions.  Increase gabapentin 300 mg to twice daily as tolerated. - methylPREDNISolone (MEDROL) 4 MG TBPK tablet; Take by mouth as directed for 6 days  Dispense: 21 tablet; Refill: 0 - gabapentin (NEURONTIN) 300 MG capsule; Take 1 capsule (300 mg total) by mouth 2 (two) times daily.  Dispense: 180 capsule; Refill: 1  2. Left lumbar radiculopathy Medrol taper started.  Take as directed for 6 days.  Advised patient to monitor blood sugars very carefully as steroid taper can increase blood sugars.  Patient voices understanding of instructions.  Increase gabapentin 300 mg to twice daily as tolerated.   Return for as scheduled.        [name], NP  [facility] at [facility] [phone] (phone) [phone] (fax)  [place]

## 2021-12-12 ENCOUNTER — Other Ambulatory Visit: Payer: Self-pay | Admitting: Physician Assistant

## 2021-12-12 DIAGNOSIS — M2550 Pain in unspecified joint: Secondary | ICD-10-CM

## 2021-12-24 ENCOUNTER — Other Ambulatory Visit: Payer: Self-pay | Admitting: Physician Assistant

## 2021-12-24 DIAGNOSIS — E1169 Type 2 diabetes mellitus with other specified complication: Secondary | ICD-10-CM

## 2022-02-09 ENCOUNTER — Encounter: Payer: Self-pay | Admitting: Physician Assistant

## 2022-02-09 ENCOUNTER — Ambulatory Visit: Payer: 59 | Admitting: Physician Assistant

## 2022-02-09 VITALS — BP 127/82 | HR 84 | Ht 73.0 in | Wt 344.0 lb

## 2022-02-09 DIAGNOSIS — E1159 Type 2 diabetes mellitus with other circulatory complications: Secondary | ICD-10-CM

## 2022-02-09 DIAGNOSIS — E559 Vitamin D deficiency, unspecified: Secondary | ICD-10-CM

## 2022-02-09 DIAGNOSIS — G4700 Insomnia, unspecified: Secondary | ICD-10-CM | POA: Diagnosis not present

## 2022-02-09 DIAGNOSIS — E782 Mixed hyperlipidemia: Secondary | ICD-10-CM

## 2022-02-09 DIAGNOSIS — I152 Hypertension secondary to endocrine disorders: Secondary | ICD-10-CM | POA: Diagnosis not present

## 2022-02-09 DIAGNOSIS — E1169 Type 2 diabetes mellitus with other specified complication: Secondary | ICD-10-CM | POA: Diagnosis not present

## 2022-02-09 LAB — POCT GLYCOSYLATED HEMOGLOBIN (HGB A1C): HbA1c POC (<> result, manual entry): 6.2 % (ref 4.0–5.6)

## 2022-02-09 MED ORDER — TRAZODONE HCL 50 MG PO TABS: 25.0000 mg | ORAL_TABLET | Freq: Every evening | ORAL | 1 refills | Status: DC | PRN

## 2022-02-09 NOTE — Assessment & Plan Note (Signed)
-  Last lipid panel: HDL 44, LDL 104. Discussed medication adherence. Will collect lipid panel and repeat liver function. Discussed low fat diet.

## 2022-02-09 NOTE — Assessment & Plan Note (Signed)
-  Last Vitamin D normal. Will repeat Vit D. Pending results will refill Vitamin D 50,000 units weekly if indicated.

## 2022-02-09 NOTE — Assessment & Plan Note (Signed)
-  Stable. Continue current medication regimen. No changes, see med list. Will collect CMP to monitor renal function and electrolytes.

## 2022-02-09 NOTE — Assessment & Plan Note (Addendum)
-  A1c today 6.2, remains at goal <7.0. Will continue current medication regimen, see med list. Discussed diabetic diet including avoiding energy drinks.

## 2022-02-09 NOTE — Progress Notes (Signed)
Established patient visit   Patient: Ricardo Wolf   DOB: 1965/12/07   56 y.o. Male  MRN: [id number] Visit Date: 02/09/2022  Chief Complaint  Patient presents with   Diabetes   Subjective    HPI  Patient presents for chronic follow-up visit.  Diabetes: No increased urination or thirst. Pt reports medication compliance. No hypoglycemic events. Not checking glucose at home. Patient reports unfortunately his sister passed away this weekend so it has been challenging the last couple of days. States not sleep well.   HTN: No chest pain, palpitations, dizziness or lower extremity swelling. Taking medication as directed without side effects.   HLD: Pt taking medication without issues. Does report some days misses doses of his medication. Has not been as diligent with his diet.    Medications: Outpatient Medications Prior to Visit  Medication Sig   Ez Smart Blood Glucose Lancets MISC Use to check fasting blood sugar   gabapentin (NEURONTIN) 300 MG capsule Take 1 capsule (300 mg total) by mouth 2 (two) times daily.   glucose blood test strip Use as instructed   linaGLIPtin-metFORMIN HCl (JENTADUETO) 2.08-998 MG TABS TAKE 1 TABLET BY MOUTH TWICE A DAY   lisinopril (ZESTRIL) 20 MG tablet Take 1 tablet (20 mg total) by mouth daily.   meloxicam (MOBIC) 7.5 MG tablet TAKE 1 TABLET BY MOUTH EVERY DAY AS NEEDED FOR PAIN   metoprolol succinate (TOPROL-XL) 100 MG 24 hr tablet Take 1 tablet (100 mg total) by mouth daily. TAKE WITH OR IMMEDIATELY FOLLOWING A MEAL.   pravastatin (PRAVACHOL) 20 MG tablet Take 1 tablet (20 mg total) by mouth daily.   TRULICITY 1.5 IR/5.1OA SOPN INJECT 1.5 MG INTO THE SKIN ONCE A WEEK.   Vitamin D, Ergocalciferol, (DRISDOL) 1.25 MG (50000 UNIT) CAPS capsule Take 1 capsule on Wednesday and Sunday weekly (Patient not taking: Reported on 02/09/2022)   [DISCONTINUED] methylPREDNISolone (MEDROL) 4 MG TBPK tablet Take by mouth as directed for 6 days   No facility-administered  medications prior to visit.    Review of Systems Review of Systems:  A fourteen system review of systems was performed and found to be positive as per HPI.  Last CBC Lab Results  Component Value Date   WBC 5.8 10/10/2021   HGB 15.8 10/10/2021   HCT 47.8 10/10/2021   MCV 90 10/10/2021   MCH 29.7 10/10/2021   RDW 13.3 10/10/2021   PLT 178 41/66/0630   Last metabolic panel Lab Results  Component Value Date   GLUCOSE 145 (H) 10/10/2021   NA 138 10/10/2021   K 4.4 10/10/2021   CL 102 10/10/2021   CO2 22 10/10/2021   BUN 21 10/10/2021   CREATININE 0.88 10/10/2021   EGFR 102 10/10/2021   CALCIUM 9.3 10/10/2021   PROT 7.5 10/10/2021   ALBUMIN 4.6 10/10/2021   LABGLOB 2.9 10/10/2021   AGRATIO 1.6 10/10/2021   BILITOT 0.3 10/10/2021   ALKPHOS 70 10/10/2021   AST 28 10/10/2021   ALT 26 10/10/2021   Last lipids Lab Results  Component Value Date   CHOL 173 10/10/2021   HDL 44 10/10/2021   LDLCALC 104 (H) 10/10/2021   TRIG 140 10/10/2021   CHOLHDL 3.9 10/10/2021   Last hemoglobin A1c Lab Results  Component Value Date   HGBA1C 6.2 02/09/2022   Last thyroid functions Lab Results  Component Value Date   TSH 1.630 06/12/2021   Last vitamin D Lab Results  Component Value Date   VD25OH 81.4 02/06/2021  Objective    BP 127/82   Pulse 84   Ht '6\' 1"'  (1.854 m)   Wt (!) 344 lb (156 kg)   SpO2 94%   BMI 45.39 kg/m  BP Readings from Last 3 Encounters:  02/09/22 127/82  11/10/21 124/79  10/10/21 134/80   Wt Readings from Last 3 Encounters:  02/09/22 (!) 344 lb (156 kg)  11/10/21 (!) 340 lb 12.8 oz (154.6 kg)  10/10/21 (!) 342 lb (155.1 kg)    Physical Exam  General:  Pleasant and cooperative, appropriate for stated age.  Neuro:  Alert and oriented,  extra-ocular muscles intact  HEENT:  Normocephalic, atraumatic, neck supple  Skin:  no gross rash, warm, pink. Cardiac:  RRR, S1 S2 Respiratory: CTA B/L w/o rhonchi or wheezing. Vascular:  Ext warm, no  cyanosis apprec.; cap RF less 2 sec. Psych:  No HI/SI, judgement and insight good, Euthymic mood. Full Affect.   Results for orders placed or performed in visit on 02/09/22  POCT glycosylated hemoglobin (Hb A1C)  Result Value Ref Range   Hemoglobin A1C     HbA1c POC (<> result, manual entry) 6.2 4.0 - 5.6 %   HbA1c, POC (prediabetic range)     HbA1c, POC (controlled diabetic range)      Assessment & Plan      Problem List Items Addressed This Visit       Cardiovascular and Mediastinum   Hypertension associated with diabetes ([place]) (Chronic)    -Stable. Continue current medication regimen. No changes, see med list. Will collect CMP to monitor renal function and electrolytes.      Relevant Orders   POCT glycosylated hemoglobin (Hb A1C) (Completed)   Comp Met (CMET)     Endocrine   Diabetes mellitus (HCC) - Primary (Chronic)    -A1c today 6.2, remains at goal <7.0. Will continue current medication regimen, see med list. Discussed diabetic diet including avoiding energy drinks.      Relevant Orders   POCT glycosylated hemoglobin (Hb A1C) (Completed)   Mixed diabetic hyperlipidemia associated with type 2 diabetes mellitus (HCC) (Chronic)    -Last lipid panel: HDL 44, LDL 104. Discussed medication adherence. Will collect lipid panel and repeat liver function. Discussed low fat diet.       Relevant Orders   POCT glycosylated hemoglobin (Hb A1C) (Completed)   Comp Met (CMET)   Lipid Profile     Other   Vitamin D deficiency    -Last Vitamin D normal. Will repeat Vit D. Pending results will refill Vitamin D 50,000 units weekly if indicated.      Relevant Orders   Vitamin D (25 hydroxy)   Other Visit Diagnoses     Insomnia, unspecified type       Relevant Medications   traZODone (DESYREL) 50 MG tablet      Insomnia: -Secondary to acute stressful life event. Will trial Trazodone 50 mg. Advised patient can titrate dose to 100 mg if 50 mg ineffective. If 100 mg of  trazodone ineffective then we can consider alternatives. Recommend to continue to use good support system at home.   Return in about 4 months (around 06/12/2022) for DM, HTN, HLD.        Lorrene Reid, PA-C  [facility] Primary Care at [facility] [phone] (phone) [phone] (fax)  [place]

## 2022-02-09 NOTE — Patient Instructions (Signed)

## 2022-02-10 LAB — VITAMIN D 25 HYDROXY (VIT D DEFICIENCY, FRACTURES): Vit D, 25-Hydroxy: 33 ng/mL (ref 30.0–100.0)

## 2022-02-10 LAB — COMPREHENSIVE METABOLIC PANEL
ALT: 33 IU/L (ref 0–44)
AST: 31 IU/L (ref 0–40)
Albumin/Globulin Ratio: 1.5 (ref 1.2–2.2)
Albumin: 4.5 g/dL (ref 3.8–4.9)
Alkaline Phosphatase: 68 IU/L (ref 44–121)
BUN/Creatinine Ratio: 13 (ref 9–20)
BUN: 12 mg/dL (ref 6–24)
Bilirubin Total: 0.7 mg/dL (ref 0.0–1.2)
CO2: 23 mmol/L (ref 20–29)
Calcium: 9.3 mg/dL (ref 8.7–10.2)
Chloride: 100 mmol/L (ref 96–106)
Creatinine, Ser: 0.96 mg/dL (ref 0.76–1.27)
Globulin, Total: 3.1 g/dL (ref 1.5–4.5)
Glucose: 142 mg/dL — ABNORMAL HIGH (ref 70–99)
Potassium: 4.4 mmol/L (ref 3.5–5.2)
Sodium: 139 mmol/L (ref 134–144)
Total Protein: 7.6 g/dL (ref 6.0–8.5)
eGFR: 93 mL/min/{1.73_m2} (ref >59)

## 2022-02-10 LAB — LIPID PANEL
Chol/HDL Ratio: 3.8 ratio (ref 0.0–5.0)
Cholesterol, Total: 166 mg/dL (ref 100–199)
HDL: 44 mg/dL (ref >39)
LDL Chol Calc (NIH): 101 mg/dL — ABNORMAL HIGH (ref 0–99)
Triglycerides: 117 mg/dL (ref 0–149)
VLDL Cholesterol Cal: 21 mg/dL (ref 5–40)

## 2022-03-04 ENCOUNTER — Other Ambulatory Visit: Payer: Self-pay | Admitting: Physician Assistant

## 2022-03-04 DIAGNOSIS — G4700 Insomnia, unspecified: Secondary | ICD-10-CM

## 2022-05-11 ENCOUNTER — Other Ambulatory Visit: Payer: Self-pay

## 2022-05-11 DIAGNOSIS — I152 Hypertension secondary to endocrine disorders: Secondary | ICD-10-CM

## 2022-05-11 MED ORDER — LISINOPRIL 20 MG PO TABS: 20.0000 mg | ORAL_TABLET | Freq: Every day | ORAL | 0 refills | Status: DC

## 2022-06-12 ENCOUNTER — Encounter: Payer: Self-pay | Admitting: Nurse Practitioner

## 2022-06-12 ENCOUNTER — Ambulatory Visit: Payer: 59 | Admitting: Nurse Practitioner

## 2022-06-12 VITALS — BP 133/89 | HR 76 | Resp 18 | Ht 73.0 in | Wt 349.0 lb

## 2022-06-12 DIAGNOSIS — E1169 Type 2 diabetes mellitus with other specified complication: Secondary | ICD-10-CM

## 2022-06-12 DIAGNOSIS — I152 Hypertension secondary to endocrine disorders: Secondary | ICD-10-CM | POA: Diagnosis not present

## 2022-06-12 DIAGNOSIS — E1159 Type 2 diabetes mellitus with other circulatory complications: Secondary | ICD-10-CM | POA: Diagnosis not present

## 2022-06-12 DIAGNOSIS — F5105 Insomnia due to other mental disorder: Secondary | ICD-10-CM | POA: Diagnosis not present

## 2022-06-12 DIAGNOSIS — F409 Phobic anxiety disorder, unspecified: Secondary | ICD-10-CM

## 2022-06-12 DIAGNOSIS — E782 Mixed hyperlipidemia: Secondary | ICD-10-CM

## 2022-06-12 LAB — POCT GLYCOSYLATED HEMOGLOBIN (HGB A1C): HbA1c POC (<> result, manual entry): 6.7 % (ref 4.0–5.6)

## 2022-06-12 NOTE — Progress Notes (Signed)
Established patient visit   Patient: Ricardo Wolf   DOB: 01-31-1966   57 y.o. Male  MRN: [id number] Visit Date: 06/12/2022   Chief Complaint  Patient presents with   Follow-up   Diabetes   Hyperlipidemia   Hypertension   Subjective    HPI  Follow up  -type 2 diabetes  -HgbA1c is 6.7 today, up slightly from 6.1 at most recent check.  -Hypertension  -blood pressure slightly elevated at start of visit.  --this did return to normal limits by end of visit.  He denies chest pain, chest pressure, or shortness of breath. He denies headaches or visual disturbances. He denies abdominal pain, nausea, vomiting, or changes in bowel or bladder habits.      Medications: Outpatient Medications Prior to Visit  Medication Sig   Ez Smart Blood Glucose Lancets MISC Use to check fasting blood sugar   gabapentin (NEURONTIN) 300 MG capsule Take 1 capsule (300 mg total) by mouth 2 (two) times daily.   glucose blood test strip Use as instructed   linaGLIPtin-metFORMIN HCl (JENTADUETO) 2.08-998 MG TABS TAKE 1 TABLET BY MOUTH TWICE A DAY   lisinopril (ZESTRIL) 20 MG tablet Take 1 tablet (20 mg total) by mouth daily.   meloxicam (MOBIC) 7.5 MG tablet TAKE 1 TABLET BY MOUTH EVERY DAY AS NEEDED FOR PAIN   metoprolol succinate (TOPROL-XL) 100 MG 24 hr tablet Take 1 tablet (100 mg total) by mouth daily. TAKE WITH OR IMMEDIATELY FOLLOWING A MEAL.   pravastatin (PRAVACHOL) 20 MG tablet Take 1 tablet (20 mg total) by mouth daily.   traZODone (DESYREL) 50 MG tablet TAKE 0.5-1 TABLETS BY MOUTH AT BEDTIME AS NEEDED FOR SLEEP.   TRULICITY 1.5 0000000 SOPN INJECT 1.5 MG INTO THE SKIN ONCE A WEEK.   Vitamin D, Ergocalciferol, (DRISDOL) 1.25 MG (50000 UNIT) CAPS capsule Take 1 capsule on Wednesday and Sunday weekly   No facility-administered medications prior to visit.    Review of Systems  Constitutional:  Negative for activity change, chills, fatigue and fever.  HENT:  Negative for congestion, postnasal drip,  rhinorrhea, sinus pressure, sinus pain, sneezing and sore throat.   Eyes: Negative.   Respiratory:  Negative for cough, shortness of breath and wheezing.   Cardiovascular:  Negative for chest pain and palpitations.  Gastrointestinal:  Negative for constipation, diarrhea, nausea and vomiting.  Endocrine: Negative for cold intolerance, heat intolerance, polydipsia and polyuria.       Blood sugars doing well    Genitourinary:  Negative for dysuria, frequency and urgency.  Musculoskeletal:  Negative for back pain and myalgias.  Skin:  Negative for rash.  Allergic/Immunologic: Negative for environmental allergies.  Neurological:  Negative for dizziness, weakness and headaches.  Psychiatric/Behavioral:  The patient is not nervous/anxious.     Last CBC Lab Results  Component Value Date   WBC 5.8 10/10/2021   HGB 15.8 10/10/2021   HCT 47.8 10/10/2021   MCV 90 10/10/2021   MCH 29.7 10/10/2021   RDW 13.3 10/10/2021   PLT 178 [id number]   Last metabolic panel Lab Results  Component Value Date   GLUCOSE 142 (H) 02/09/2022   NA 139 02/09/2022   K 4.4 02/09/2022   CL 100 02/09/2022   CO2 23 02/09/2022   BUN 12 02/09/2022   CREATININE 0.96 02/09/2022   EGFR 93 02/09/2022   CALCIUM 9.3 02/09/2022   PROT 7.6 02/09/2022   ALBUMIN 4.5 02/09/2022   LABGLOB 3.1 02/09/2022   AGRATIO 1.5 02/09/2022   BILITOT 0.7  02/09/2022   ALKPHOS 68 02/09/2022   AST 31 02/09/2022   ALT 33 02/09/2022   Last lipids Lab Results  Component Value Date   CHOL 166 02/09/2022   HDL 44 02/09/2022   LDLCALC 101 (H) 02/09/2022   TRIG 117 02/09/2022   CHOLHDL 3.8 02/09/2022   Last hemoglobin A1c Lab Results  Component Value Date   HGBA1C 6.7 06/12/2022   Last thyroid functions Lab Results  Component Value Date   TSH 1.630 06/12/2021   Last vitamin D Lab Results  Component Value Date   VD25OH 33.0 02/09/2022       Objective     Today's Vitals   06/12/22 0815 06/12/22 0848  BP: (Abnormal)  152/92 133/89  Pulse: 76 76  Resp: 18   SpO2: 96%   Weight: (Abnormal) 349 lb (158.3 kg)   Height: 6' 1"$  (1.854 m)    Body mass index is 46.04 kg/m.  BP Readings from Last 3 Encounters:  06/12/22 133/89  02/09/22 127/82  11/10/21 124/79    Wt Readings from Last 3 Encounters:  06/12/22 (Abnormal) 349 lb (158.3 kg)  02/09/22 (Abnormal) 344 lb (156 kg)  11/10/21 (Abnormal) 340 lb 12.8 oz (154.6 kg)    Physical Exam Vitals and nursing note reviewed.  Constitutional:      Appearance: Normal appearance. He is well-developed. He is obese.  HENT:     Head: Normocephalic.     Nose: Nose normal.  Eyes:     Pupils: Pupils are equal, round, and reactive to light.  Cardiovascular:     Rate and Rhythm: Normal rate and regular rhythm.     Pulses: Normal pulses.     Heart sounds: Normal heart sounds.  Pulmonary:     Effort: Pulmonary effort is normal.     Breath sounds: Normal breath sounds.  Abdominal:     Palpations: Abdomen is soft.  Musculoskeletal:        General: Normal range of motion.     Cervical back: Normal range of motion and neck supple.  Lymphadenopathy:     Cervical: No cervical adenopathy.  Skin:    General: Skin is warm and dry.     Capillary Refill: Capillary refill takes less than 2 seconds.  Neurological:     General: No focal deficit present.     Mental Status: He is alert and oriented to person, place, and time.  Psychiatric:        Mood and Affect: Mood normal.        Behavior: Behavior normal.        Thought Content: Thought content normal.        Judgment: Judgment normal.      Results for orders placed or performed in visit on 06/12/22  POCT HgB A1C  Result Value Ref Range   Hemoglobin A1C     HbA1c POC (<> result, manual entry) 6.7 4.0 - 5.6 %   HbA1c, POC (prediabetic range)     HbA1c, POC (controlled diabetic range)      Assessment & Plan    1. Type 2 diabetes mellitus with other specified complication, without long-term current use of  insulin (HCC) HgbA1c 6.7 today. Continue diabetic medication as prescribed. Recheck in 3 months.  - POCT HgB A1C  2. Hypertension associated with diabetes ([place]) Stable. Continue bp medication as prescribed   3. Mixed diabetic hyperlipidemia associated with type 2 diabetes mellitus ([place]) Continue pravastatin as prescribed   4. Insomnia due to anxiety and fear  May take trazodone as needed and as prescribed    Problem List Items Addressed This Visit       Cardiovascular and Mediastinum   Hypertension associated with diabetes ([place]) (Chronic)     Endocrine   Diabetes mellitus ([place]) - Primary (Chronic)   Relevant Orders   POCT HgB A1C (Completed)   Mixed diabetic hyperlipidemia associated with type 2 diabetes mellitus ([place]) (Chronic)   Other Visit Diagnoses     Insomnia due to anxiety and fear            Return in about 4 months (around 10/11/2022) for diabetes with HgbA1c check, 8 mos CPE with FBW a week prior to visit  - see below.         [name], NP  [facility] at [facility] [phone] (phone) [phone] (fax)  [place]

## 2022-07-06 ENCOUNTER — Telehealth: Payer: Self-pay | Admitting: *Deleted

## 2022-07-06 NOTE — Telephone Encounter (Signed)
Pt calling stating that pharmacy cannot currently get his 1.'5mg'$  trulicity due to supply and wanted to see if he could get the .'75mg'$  sent in until manufacturer can get the higher doses. He would [facility] [place]. Please advise.   LOV2/16/24 ROV06/18/2024

## 2022-07-07 ENCOUNTER — Other Ambulatory Visit: Payer: Self-pay | Admitting: Nurse Practitioner

## 2022-07-07 DIAGNOSIS — E1169 Type 2 diabetes mellitus with other specified complication: Secondary | ICD-10-CM

## 2022-07-07 MED ORDER — TRULICITY 0.75 MG/0.5ML ~~LOC~~ SOAJ: 1.5000 mg | SUBCUTANEOUS | 2 refills | Status: DC

## 2022-07-07 NOTE — Telephone Encounter (Signed)
Please let him know that I changed the prescription to trulicity [id number] - taking two shots weekly to equal to 1.5 mg weekly he was taking. [facility] [place], [place]. Thanks  -HB

## 2022-07-07 NOTE — Telephone Encounter (Signed)
Pt said he has called like 7 pharmacies and no one has it and he said he even called the manufacturer and they do not know when they will have that dose available.

## 2022-07-07 NOTE — Telephone Encounter (Signed)
Before I lower the dose of trulicity, can he call other pharmacies in his area to see if they have the current dose of trulicity in stock? That would be the best alternative.

## 2022-07-08 ENCOUNTER — Other Ambulatory Visit: Payer: Self-pay | Admitting: Nurse Practitioner

## 2022-07-08 DIAGNOSIS — E1169 Type 2 diabetes mellitus with other specified complication: Secondary | ICD-10-CM

## 2022-07-08 NOTE — Telephone Encounter (Signed)
Per pharmacy calling today for clarification. See Below.         Pharmacy calling to say that the sig on the trulicity needs to be changed to the .75 weekly instead of the 1.5 due to insurance not covering it with those directions.  Per provider gave verbal to change to the .75 dose once weekly.

## 2022-07-08 NOTE — Telephone Encounter (Signed)
Pharmacy calling to say that the sig on the trulicity needs to be changed to the .75 weekly instead of the 1.5 due to insurance not covering it with those directions.  Per provider gave verbal to change to the .75 dose once weekly.

## 2022-07-18 ENCOUNTER — Other Ambulatory Visit: Payer: Self-pay | Admitting: Nurse Practitioner

## 2022-07-18 DIAGNOSIS — M5431 Sciatica, right side: Secondary | ICD-10-CM

## 2022-08-28 ENCOUNTER — Other Ambulatory Visit: Payer: Self-pay | Admitting: Nurse Practitioner

## 2022-08-28 DIAGNOSIS — E1159 Type 2 diabetes mellitus with other circulatory complications: Secondary | ICD-10-CM

## 2022-09-01 ENCOUNTER — Telehealth: Payer: Self-pay

## 2022-09-01 ENCOUNTER — Other Ambulatory Visit: Payer: Self-pay

## 2022-09-01 DIAGNOSIS — E1169 Type 2 diabetes mellitus with other specified complication: Secondary | ICD-10-CM

## 2022-09-01 MED ORDER — PRAVASTATIN SODIUM 20 MG PO TABS: 20.0000 mg | ORAL_TABLET | Freq: Every day | ORAL | 1 refills | Status: DC

## 2022-09-01 NOTE — Telephone Encounter (Signed)
[facility] 

## 2022-09-01 NOTE — Telephone Encounter (Signed)
Pt is requesting refills on: pravastatin (PRAVACHOL) 20 MG tablet   Pharmacy: [id number] - [place], [place] - [address]    Lov 06/22/22 ROV 10/13/22

## 2022-09-03 ENCOUNTER — Other Ambulatory Visit: Payer: Self-pay | Admitting: Nurse Practitioner

## 2022-09-03 DIAGNOSIS — E1159 Type 2 diabetes mellitus with other circulatory complications: Secondary | ICD-10-CM

## 2022-09-15 ENCOUNTER — Telehealth: Payer: Self-pay

## 2022-09-15 ENCOUNTER — Other Ambulatory Visit: Payer: Self-pay

## 2022-09-15 DIAGNOSIS — I152 Hypertension secondary to endocrine disorders: Secondary | ICD-10-CM

## 2022-09-15 MED ORDER — METOPROLOL SUCCINATE ER 100 MG PO TB24: 100.0000 mg | ORAL_TABLET | Freq: Every day | ORAL | 1 refills | Status: DC

## 2022-09-15 NOTE — Telephone Encounter (Signed)
Pt is requesting a refill on: metoprolol succinate (TOPROL-XL) 100 MG 24 hr tablet    Pharmacy: [id number] - [place], [place] - [address]

## 2022-09-15 NOTE — Telephone Encounter (Signed)
[facility] [place] 

## 2022-10-13 ENCOUNTER — Ambulatory Visit: Payer: 59 | Admitting: Nurse Practitioner

## 2022-10-18 NOTE — Progress Notes (Signed)
Established patient visit   Patient: Ricardo Wolf   DOB: December 17, 1965   56 y.o. Male  MRN: [id number] Visit Date: 10/19/2022   Chief Complaint  Patient presents with   Medical Management of Chronic Issues   Subjective    HPI  Follow up -DM 2  --HgbA1c today is 7.1, up from 6.7 at his last visit.  --urine microalbumin is slightly abnormal . --not taking trulicity. Taking Jentadueto 2.08/998 mg twice daily  -HTN --blood pressure is generally well controlled.   He denies chest pain, chest pressure, or shortness of breath. He denies headaches or visual disturbances. He denies abdominal pain, nausea, vomiting, or changes in bowel or bladder habits.     Medications: Outpatient Medications Prior to Visit  Medication Sig   Ez Smart Blood Glucose Lancets MISC Use to check fasting blood sugar   glucose blood test strip Use as instructed   meloxicam (MOBIC) 7.5 MG tablet TAKE 1 TABLET BY MOUTH EVERY DAY AS NEEDED FOR PAIN   metoprolol succinate (TOPROL-XL) 100 MG 24 hr tablet Take 1 tablet (100 mg total) by mouth daily. TAKE WITH OR IMMEDIATELY FOLLOWING A MEAL.   pravastatin (PRAVACHOL) 20 MG tablet Take 1 tablet (20 mg total) by mouth daily.   traZODone (DESYREL) 50 MG tablet TAKE 0.5-1 TABLETS BY MOUTH AT BEDTIME AS NEEDED FOR SLEEP.   Vitamin D, Ergocalciferol, (DRISDOL) 1.25 MG (50000 UNIT) CAPS capsule Take 1 capsule on Wednesday and Sunday weekly   [DISCONTINUED] Dulaglutide (TRULICITY) 0.75 MG/0.5ML SOPN INJECT 1.5 MG INTO THE SKIN ONCE A WEEK.   [DISCONTINUED] gabapentin (NEURONTIN) 300 MG capsule TAKE 1 CAPSULE BY MOUTH TWICE A DAY   [DISCONTINUED] lisinopril (ZESTRIL) 20 MG tablet TAKE 1 TABLET BY MOUTH EVERY DAY   No facility-administered medications prior to visit.    Review of Systems See HPI    Last CBC Lab Results  Component Value Date   WBC 4.7 10/19/2022   HGB 15.3 10/19/2022   HCT 45.2 10/19/2022   MCV 88 10/19/2022   MCH 29.8 10/19/2022   RDW 13.6 10/19/2022    PLT 176 10/19/2022   Last metabolic panel Lab Results  Component Value Date   GLUCOSE 137 (H) 10/19/2022   NA 141 10/19/2022   K 4.5 10/19/2022   CL 103 10/19/2022   CO2 22 10/19/2022   BUN 15 10/19/2022   CREATININE 0.74 (L) 10/19/2022   EGFR 106 10/19/2022   CALCIUM 9.2 10/19/2022   PROT 7.3 10/19/2022   ALBUMIN 4.4 10/19/2022   LABGLOB 2.9 10/19/2022   AGRATIO 1.5 02/09/2022   BILITOT 0.3 10/19/2022   ALKPHOS 62 10/19/2022   AST 43 (H) 10/19/2022   ALT 40 10/19/2022   Last lipids Lab Results  Component Value Date   CHOL 210 (H) 10/19/2022   HDL 48 10/19/2022   LDLCALC 134 (H) 10/19/2022   TRIG 158 (H) 10/19/2022   CHOLHDL 4.4 10/19/2022   Last hemoglobin A1c Lab Results  Component Value Date   HGBA1C 7.1 10/19/2022   Last thyroid functions Lab Results  Component Value Date   TSH 3.340 10/19/2022   Last vitamin D Lab Results  Component Value Date   VD25OH 33.0 02/09/2022       Objective     Today's Vitals   10/19/22 0821 10/19/22 0847  BP: (Abnormal) 160/100 (Abnormal) 151/91  Pulse: 78   SpO2: 95%   Weight: (Abnormal) 349 lb 6.4 oz (158.5 kg)   Height: 6\' 1"  (1.854 m)    Body  mass index is 46.1 kg/m.  BP Readings from Last 3 Encounters:  10/19/22 (Abnormal) 151/91  06/12/22 133/89  02/09/22 127/82    Wt Readings from Last 3 Encounters:  10/19/22 (Abnormal) 349 lb 6.4 oz (158.5 kg)  06/12/22 (Abnormal) 349 lb (158.3 kg)  02/09/22 (Abnormal) 344 lb (156 kg)    Physical Exam Vitals and nursing note reviewed.  Constitutional:      Appearance: Normal appearance. He is well-developed. He is obese.  HENT:     Head: Normocephalic and atraumatic.     Nose: Nose normal.     Mouth/Throat:     Mouth: Mucous membranes are moist.     Pharynx: Oropharynx is clear.  Eyes:     Extraocular Movements: Extraocular movements intact.     Conjunctiva/sclera: Conjunctivae normal.     Pupils: Pupils are equal, round, and reactive to light.  Neck:      Vascular: No carotid bruit.  Cardiovascular:     Rate and Rhythm: Normal rate and regular rhythm.     Pulses: Normal pulses.     Heart sounds: Normal heart sounds.  Pulmonary:     Effort: Pulmonary effort is normal.     Breath sounds: Normal breath sounds.  Abdominal:     Palpations: Abdomen is soft.  Musculoskeletal:        General: Normal range of motion.     Cervical back: Normal range of motion and neck supple.  Lymphadenopathy:     Cervical: No cervical adenopathy.  Skin:    General: Skin is warm and dry.     Capillary Refill: Capillary refill takes less than 2 seconds.  Neurological:     General: No focal deficit present.     Mental Status: He is alert and oriented to person, place, and time.  Psychiatric:        Mood and Affect: Mood normal.        Behavior: Behavior normal.        Thought Content: Thought content normal.        Judgment: Judgment normal.      Results for orders placed or performed in visit on 10/19/22  TSH + free T4  Result Value Ref Range   TSH 3.340 0.450 - 4.500 uIU/mL   Free T4 0.98 0.82 - 1.77 ng/dL  Lipid panel  Result Value Ref Range   Cholesterol, Total 210 (H) 100 - 199 mg/dL   Triglycerides 161 (H) 0 - 149 mg/dL   HDL 48 >09 mg/dL   VLDL Cholesterol Cal 28 5 - 40 mg/dL   LDL Chol Calc (NIH) 604 (H) 0 - 99 mg/dL   Chol/HDL Ratio 4.4 0.0 - 5.0 ratio  Comp Met (CMET)  Result Value Ref Range   Glucose 137 (H) 70 - 99 mg/dL   BUN 15 6 - 24 mg/dL   Creatinine, Ser 5.40 (L) 0.76 - 1.27 mg/dL   eGFR 981 >19 JY/NWG/9.56   BUN/Creatinine Ratio 20 9 - 20   Sodium 141 134 - 144 mmol/L   Potassium 4.5 3.5 - 5.2 mmol/L   Chloride 103 96 - 106 mmol/L   CO2 22 20 - 29 mmol/L   Calcium 9.2 8.7 - 10.2 mg/dL   Total Protein 7.3 6.0 - 8.5 g/dL   Albumin 4.4 3.8 - 4.9 g/dL   Globulin, Total 2.9 1.5 - 4.5 g/dL   Bilirubin Total 0.3 0.0 - 1.2 mg/dL   Alkaline Phosphatase 62 44 - 121 IU/L   AST 43 (H)  0 - 40 IU/L   ALT 40 0 - 44 IU/L  CBC   Result Value Ref Range   WBC 4.7 3.4 - 10.8 x10E3/uL   RBC 5.13 4.14 - 5.80 x10E6/uL   Hemoglobin 15.3 13.0 - 17.7 g/dL   Hematocrit 91.4 78.2 - 51.0 %   MCV 88 79 - 97 fL   MCH 29.8 26.6 - 33.0 pg   MCHC 33.8 31.5 - 35.7 g/dL   RDW 95.6 21.3 - 08.6 %   Platelets 176 150 - 450 x10E3/uL  POCT UA - Microalbumin  Result Value Ref Range   Microalbumin Ur, POC 80 mg/L   Creatinine, POC 200 mg/dL   Albumin/Creatinine Ratio, Urine, POC 30-300   POCT glycosylated hemoglobin (Hb A1C)  Result Value Ref Range   Hemoglobin A1C     HbA1c POC (<> result, manual entry) 7.1 4.0 - 5.6 %   HbA1c, POC (prediabetic range)     HbA1c, POC (controlled diabetic range)      Assessment & Plan    Type 2 diabetes mellitus with other specified complication, without long-term current use of insulin (HCC) Assessment & Plan: Hemoglobin A1c 7.1 today, up from 6.7 at most recent visit. Urine microalbumin slightly abnormal. No med changes today. Revisited importance of limiting carbohydrates and sugar in the diet.  Participate in regular exercise as tolerated. Recheck hemoglobin A1c in 3 months. Refer to podiatry for diabetic footcare.  Orders: -     TSH + free T4; Future -     POCT UA - Microalbumin -     POCT glycosylated hemoglobin (Hb A1C) -     Ambulatory referral to Podiatry -     Jentadueto XR; Take 1 tablet by mouth daily.  Dispense: 30 tablet; Refill: 3  Hypertension associated with diabetes (HCC) Assessment & Plan: -Stable. Continue current medication regimen. No changes, see med list. Will collect CMP to monitor renal function and electrolytes.  Orders: -     Lisinopril; Take 1 tablet (20 mg total) by mouth daily.  Dispense: 90 tablet; Refill: 1 -     CBC; Future -     Comprehensive metabolic panel; Future -     POCT UA - Microalbumin -     POCT glycosylated hemoglobin (Hb A1C)  Mixed diabetic hyperlipidemia associated with type 2 diabetes mellitus (HCC) Assessment & Plan: Most recent  lipid panel -   Lipid Panel     Component Value Date/Time   CHOL 210 (H) 10/19/2022 0847   TRIG 158 (H) 10/19/2022 0847   HDL 48 10/19/2022 0847   CHOLHDL 4.4 10/19/2022 0847   LDLCALC 134 (H) 10/19/2022 0847   LABVLDL 28 10/19/2022 0847  Continue pravastatin as prescribed.  Orders: -     Lipid panel; Future -     POCT UA - Microalbumin -     POCT glycosylated hemoglobin (Hb A1C)  Bilateral sciatica Assessment & Plan: May take gabapentin 300 mg twice daily New prescription sent to pharmacy today.  Orders: -     Gabapentin; Take 1 capsule (300 mg total) by mouth 2 (two) times daily.  Dispense: 180 capsule; Refill: 1  Vitamin D deficiency Assessment & Plan: Check vitamin d level and treat deficiency as indicated.    Orders: -     TSH + free T4; Future  Morbid obesity (HCC) Assessment & Plan: Discussed lowering calorie intake to 1500 calories per day and incorporating exercise into daily routine to help lose weight.   Orders: -  TSH + free T4; Future     Return in about 4 months (around 02/18/2023) for health maintenance exam, check HgbA1c.         Carlean Jews, NP  [facility] at [facility] [phone] (phone) [phone] (fax)  [facility]

## 2022-10-19 ENCOUNTER — Ambulatory Visit: Payer: 59 | Admitting: Nurse Practitioner

## 2022-10-19 ENCOUNTER — Encounter: Payer: Self-pay | Admitting: Nurse Practitioner

## 2022-10-19 VITALS — BP 151/91 | HR 78 | Ht 73.0 in | Wt 349.4 lb

## 2022-10-19 DIAGNOSIS — I152 Hypertension secondary to endocrine disorders: Secondary | ICD-10-CM | POA: Diagnosis not present

## 2022-10-19 DIAGNOSIS — M5431 Sciatica, right side: Secondary | ICD-10-CM

## 2022-10-19 DIAGNOSIS — E1159 Type 2 diabetes mellitus with other circulatory complications: Secondary | ICD-10-CM

## 2022-10-19 DIAGNOSIS — M5432 Sciatica, left side: Secondary | ICD-10-CM

## 2022-10-19 DIAGNOSIS — E1169 Type 2 diabetes mellitus with other specified complication: Secondary | ICD-10-CM | POA: Diagnosis not present

## 2022-10-19 DIAGNOSIS — E782 Mixed hyperlipidemia: Secondary | ICD-10-CM

## 2022-10-19 DIAGNOSIS — Z7984 Long term (current) use of oral hypoglycemic drugs: Secondary | ICD-10-CM

## 2022-10-19 DIAGNOSIS — E559 Vitamin D deficiency, unspecified: Secondary | ICD-10-CM

## 2022-10-19 LAB — POCT UA - MICROALBUMIN
Creatinine, POC: 200 mg/dL
Microalbumin Ur, POC: 80 mg/L

## 2022-10-19 LAB — POCT GLYCOSYLATED HEMOGLOBIN (HGB A1C): HbA1c POC (<> result, manual entry): 7.1 % (ref 4.0–5.6)

## 2022-10-19 MED ORDER — GABAPENTIN 300 MG PO CAPS
300.0000 mg | ORAL_CAPSULE | Freq: Two times a day (BID) | ORAL | 1 refills | Status: DC
Start: 2022-10-19 — End: 2022-11-02

## 2022-10-19 MED ORDER — LISINOPRIL 20 MG PO TABS
20.0000 mg | ORAL_TABLET | Freq: Every day | ORAL | 1 refills | Status: DC
Start: 2022-10-19 — End: 2023-04-05

## 2022-10-19 MED ORDER — JENTADUETO XR 5-1000 MG PO TB24
1.0000 | ORAL_TABLET | Freq: Every day | ORAL | 3 refills | Status: DC
Start: 2022-10-19 — End: 2023-03-10

## 2022-10-20 LAB — COMPREHENSIVE METABOLIC PANEL
ALT: 40 IU/L (ref 0–44)
AST: 43 IU/L — ABNORMAL HIGH (ref 0–40)
Albumin: 4.4 g/dL (ref 3.8–4.9)
Alkaline Phosphatase: 62 IU/L (ref 44–121)
BUN/Creatinine Ratio: 20 (ref 9–20)
BUN: 15 mg/dL (ref 6–24)
Bilirubin Total: 0.3 mg/dL (ref 0.0–1.2)
CO2: 22 mmol/L (ref 20–29)
Calcium: 9.2 mg/dL (ref 8.7–10.2)
Chloride: 103 mmol/L (ref 96–106)
Creatinine, Ser: 0.74 mg/dL — ABNORMAL LOW (ref 0.76–1.27)
Globulin, Total: 2.9 g/dL (ref 1.5–4.5)
Glucose: 137 mg/dL — ABNORMAL HIGH (ref 70–99)
Potassium: 4.5 mmol/L (ref 3.5–5.2)
Sodium: 141 mmol/L (ref 134–144)
Total Protein: 7.3 g/dL (ref 6.0–8.5)
eGFR: 106 mL/min/{1.73_m2} (ref >59)

## 2022-10-20 LAB — LIPID PANEL
Chol/HDL Ratio: 4.4 ratio (ref 0.0–5.0)
Cholesterol, Total: 210 mg/dL — ABNORMAL HIGH (ref 100–199)
HDL: 48 mg/dL (ref >39)
LDL Chol Calc (NIH): 134 mg/dL — ABNORMAL HIGH (ref 0–99)
Triglycerides: 158 mg/dL — ABNORMAL HIGH (ref 0–149)
VLDL Cholesterol Cal: 28 mg/dL (ref 5–40)

## 2022-10-20 LAB — TSH+FREE T4
Free T4: 0.98 ng/dL (ref 0.82–1.77)
TSH: 3.34 u[IU]/mL (ref 0.450–4.500)

## 2022-10-20 LAB — CBC
Hematocrit: 45.2 % (ref 37.5–51.0)
Hemoglobin: 15.3 g/dL (ref 13.0–17.7)
MCH: 29.8 pg (ref 26.6–33.0)
MCHC: 33.8 g/dL (ref 31.5–35.7)
MCV: 88 fL (ref 79–97)
Platelets: 176 10*3/uL (ref 150–450)
RBC: 5.13 x10E6/uL (ref 4.14–5.80)
RDW: 13.6 % (ref 11.6–15.4)
WBC: 4.7 10*3/uL (ref 3.4–10.8)

## 2022-10-26 DIAGNOSIS — M5431 Sciatica, right side: Secondary | ICD-10-CM | POA: Insufficient documentation

## 2022-10-26 NOTE — Assessment & Plan Note (Addendum)
Most recent lipid panel -   Lipid Panel     Component Value Date/Time   CHOL 210 (H) 10/19/2022 0847   TRIG 158 (H) 10/19/2022 0847   HDL 48 10/19/2022 0847   CHOLHDL 4.4 10/19/2022 0847   LDLCALC 134 (H) 10/19/2022 0847   LABVLDL 28 10/19/2022 0847  Continue pravastatin as prescribed.

## 2022-10-26 NOTE — Assessment & Plan Note (Signed)
-  Stable. Continue current medication regimen. No changes, see med list. Will collect CMP to monitor renal function and electrolytes. 

## 2022-10-26 NOTE — Assessment & Plan Note (Signed)
May take gabapentin 300 mg twice daily New prescription sent to pharmacy today.

## 2022-10-26 NOTE — Assessment & Plan Note (Signed)
Discussed lowering calorie intake to 1500 calories per day and incorporating exercise into daily routine to help lose weight.  °

## 2022-10-26 NOTE — Progress Notes (Signed)
Reviewed labs during the visit.

## 2022-10-26 NOTE — Assessment & Plan Note (Addendum)
Hemoglobin A1c 7.1 today, up from 6.7 at most recent visit. Urine microalbumin slightly abnormal. No med changes today. Revisited importance of limiting carbohydrates and sugar in the diet.  Participate in regular exercise as tolerated. Recheck hemoglobin A1c in 3 months. Refer to podiatry for diabetic footcare.

## 2022-10-26 NOTE — Assessment & Plan Note (Signed)
Check vitamin d level and treat deficiency as indicated.   

## 2022-11-02 ENCOUNTER — Ambulatory Visit: Payer: 59 | Admitting: Podiatry

## 2022-11-02 DIAGNOSIS — B353 Tinea pedis: Secondary | ICD-10-CM

## 2022-11-02 DIAGNOSIS — E1142 Type 2 diabetes mellitus with diabetic polyneuropathy: Secondary | ICD-10-CM

## 2022-11-02 MED ORDER — GABAPENTIN 300 MG PO CAPS: 300.0000 mg | ORAL_CAPSULE | Freq: Three times a day (TID) | ORAL | 3 refills | Status: DC

## 2022-11-02 MED ORDER — KETOCONAZOLE 2 % EX CREA: 1.0000 | TOPICAL_CREAM | Freq: Every day | CUTANEOUS | 0 refills | Status: DC

## 2022-11-02 NOTE — Progress Notes (Signed)
Subjective:  Patient ID: Ricardo Wolf, male    DOB: 04-30-1965,  MRN: [id number]  Chief Complaint  Patient presents with   Peripheral Neuropathy    Tingling and burning sensation to bilateral feet. A1C 7.5 (3 weeks ago). Patient is taking Gabapentin 300mg  twice a day, started medication a couple of years ago.     57 y.o. male presents with concern for tingling and burning sensation of bilateral feet.  His most recent A1c is 7.5 does have a history of type 2 diabetes.  Has been taking gabapentin 300 mg twice a day for a couple years now.  He says that he feels that the effect is not as good as it used to be with the medication.  Does have some numbness in the toes occasionally but also has burning and tingling pain especially at night when he is trying to sleep.  He also reports some itching occasionally and red rash on the bottom of both feet.  Past Medical History:  Diagnosis Date   Allergy    pollen   Arthritis    back,shoulders,knees   Diabetes (HCC)    GERD (gastroesophageal reflux disease)    High cholesterol    Hypertension     Allergies  Allergen Reactions   Keflet [Cephalexin] Rash   Penicillins Rash   Sulfa Antibiotics Rash    ROS: Negative except as per HPI above  Objective:  General: AAO x3, NAD  Dermatological: Red rash present on the bottom of both feet with dry flaking skin in moccasin distribution consistent with tinea pedis  Vascular:  Dorsalis Pedis artery and Posterior Tibial artery pedal pulses are 2/4 bilateral.  Capillary fill time < 3 sec to all digits.   Neruologic: Grossly diminished via light touch to the toe level bilaterally protective sensation is intact except at the distal toes  Musculoskeletal: No gross boney pedal deformities bilateral. No pain, crepitus, or limitation noted with foot and ankle range of motion bilateral. Muscular strength 5/5 in all groups tested bilateral.  Gait: Unassisted, Nonantalgic.   No images are attached to the  encounter.   Assessment:   1. DM type 2 with diabetic peripheral neuropathy (HCC)   2. Tinea pedis of both feet      Plan:  Patient was evaluated and treated and all questions answered.  # Neuropathic pain secondary to diabetes mellitus type 2 -Discussed with patient that he may have seen advancement has neuropathic pain related to long-term diabetes mellitus type 2 -Recommend increased dose of gabapentin and discussed the risks and side effects associated with this medication -E Rx for gabapentin 300 mg 3 times daily for neuropathic pain.  Discussed he can take it either 1 tablet every 8 hours or take 2 tablets at night and 1 in the morning. -Discussed risk of drowsiness with this medication  #tinea pedis bilateral Discussed the etiology and treatment options for tinea pedis.  Discussed topical and oral treatment.  Recommended topical treatment with 2% ketoconazole cream.  This was sent to the patient's pharmacy.  Also discussed appropriate foot hygiene, use of antifungal spray such as Tinactin in shoes, as well as cleaning her foot surfaces such as showers and bathroom floors with bleach.  Return in about 6 weeks (around 12/14/2022) for f/u neruopathic pain, tinea pedis.          Corinna Gab, DPM [facility]

## 2022-12-14 ENCOUNTER — Ambulatory Visit (INDEPENDENT_AMBULATORY_CARE_PROVIDER_SITE_OTHER): Payer: 59 | Admitting: Podiatry

## 2022-12-14 DIAGNOSIS — Z91199 Patient's noncompliance with other medical treatment and regimen due to unspecified reason: Secondary | ICD-10-CM

## 2022-12-14 NOTE — Progress Notes (Signed)
No show for apt.

## 2023-01-25 ENCOUNTER — Other Ambulatory Visit: Payer: 59

## 2023-02-01 ENCOUNTER — Encounter: Payer: 59 | Admitting: Nurse Practitioner

## 2023-02-04 ENCOUNTER — Other Ambulatory Visit: Payer: Self-pay | Admitting: Family Medicine

## 2023-02-04 DIAGNOSIS — R748 Abnormal levels of other serum enzymes: Secondary | ICD-10-CM

## 2023-02-04 DIAGNOSIS — E1169 Type 2 diabetes mellitus with other specified complication: Secondary | ICD-10-CM

## 2023-02-04 DIAGNOSIS — E559 Vitamin D deficiency, unspecified: Secondary | ICD-10-CM

## 2023-02-04 DIAGNOSIS — I152 Hypertension secondary to endocrine disorders: Secondary | ICD-10-CM

## 2023-02-10 ENCOUNTER — Other Ambulatory Visit: Payer: 59

## 2023-02-18 ENCOUNTER — Encounter: Payer: Self-pay | Admitting: Family Medicine

## 2023-02-18 ENCOUNTER — Ambulatory Visit (INDEPENDENT_AMBULATORY_CARE_PROVIDER_SITE_OTHER): Payer: 59 | Admitting: Family Medicine

## 2023-02-18 VITALS — BP 147/90 | HR 76 | Resp 18 | Ht 73.0 in | Wt 350.0 lb

## 2023-02-18 DIAGNOSIS — Z0000 Encounter for general adult medical examination without abnormal findings: Secondary | ICD-10-CM | POA: Diagnosis not present

## 2023-02-18 DIAGNOSIS — Z1159 Encounter for screening for other viral diseases: Secondary | ICD-10-CM

## 2023-02-18 DIAGNOSIS — I152 Hypertension secondary to endocrine disorders: Secondary | ICD-10-CM

## 2023-02-18 DIAGNOSIS — R748 Abnormal levels of other serum enzymes: Secondary | ICD-10-CM

## 2023-02-18 DIAGNOSIS — E782 Mixed hyperlipidemia: Secondary | ICD-10-CM

## 2023-02-18 DIAGNOSIS — E1159 Type 2 diabetes mellitus with other circulatory complications: Secondary | ICD-10-CM

## 2023-02-18 DIAGNOSIS — E559 Vitamin D deficiency, unspecified: Secondary | ICD-10-CM

## 2023-02-18 DIAGNOSIS — E1169 Type 2 diabetes mellitus with other specified complication: Secondary | ICD-10-CM

## 2023-02-18 DIAGNOSIS — Z7984 Long term (current) use of oral hypoglycemic drugs: Secondary | ICD-10-CM

## 2023-02-18 DIAGNOSIS — K429 Umbilical hernia without obstruction or gangrene: Secondary | ICD-10-CM | POA: Insufficient documentation

## 2023-02-18 NOTE — Progress Notes (Addendum)
Complete physical exam  Patient: Ricardo Wolf   DOB: 12-21-1965   57 y.o. Male  MRN: [id number]  Subjective:    Chief Complaint  Patient presents with   Annual Exam    Ricardo Wolf is a 57 y.o. male who presents today for a complete physical exam. He reports consuming a general diet. The patient does not participate in regular exercise at present. He generally feels well. He reports sleeping well. He does not have additional problems to discuss today.    Most recent fall risk assessment:    06/12/2022    9:03 AM  Fall Risk   Falls in the past year? 1  Number falls in past yr: 1  Injury with Fall? 0     Most recent depression and anxiety screenings:    02/18/2023    9:53 AM 10/19/2022    8:24 AM  PHQ 2/9 Scores  PHQ - 2 Score 0 0  PHQ- 9 Score 3 3      02/18/2023    9:53 AM 06/12/2022    9:02 AM 02/09/2022    8:18 AM 11/10/2021    4:14 PM  GAD 7 : Generalized Anxiety Score  Nervous, Anxious, on Edge 0 1 0 0  Control/stop worrying 0 0 0 0  Worry too much - different things 0 0 0 0  Trouble relaxing 0 1 0 0  Restless 0 0 0 0  Easily annoyed or irritable 0 0 0 0  Afraid - awful might happen 0 0 1 0  Total GAD 7 Score 0 2 1 0  Anxiety Difficulty Not difficult at all Not difficult at all      Patient Active Problem List   Diagnosis Date Noted   Umbilical hernia 02/18/2023   Bilateral sciatica 10/26/2022   Elevated liver enzymes 12/05/2018   Tooth abscess 11/17/2018   History of smoking - 75-pack-year history; quit 2007 09/10/2018    Class: Health Concern   Microalbuminuria due to type 2 diabetes mellitus (HCC) 09/10/2018   Family history of secondary lung cancer- father late 37s, heavy smoker 09/10/2018   Vitamin D deficiency 12/17/2017   Diabetes mellitus (HCC) 11/10/2017   Hypertension associated with diabetes (HCC) 11/10/2017   Mixed diabetic hyperlipidemia associated with type 2 diabetes mellitus (HCC) 11/10/2017   Morbid obesity (HCC)-  BMI 46.2  11/10/2017   Noncompliance 11/10/2017    Past Surgical History:  Procedure Laterality Date   BACK SURGERY  2003   BACK SURGERY  2011   BACK SURGERY     2019   Social History   Tobacco Use   Smoking status: Former    Current packs/day: 0.00    Types: Cigarettes    Quit date: 2007    Years since quitting: 17.8    Passive exposure: Current   Smokeless tobacco: Never  Vaping Use   Vaping status: Never Used  Substance Use Topics   Alcohol use: Yes    Alcohol/week: 2.0 standard drinks of alcohol    Types: 2 Standard drinks or equivalent per week    Comment: "once per week"   Drug use: Never   Family History  Problem Relation Age of Onset   Diabetes Mother    Esophageal cancer Father    Lung cancer Father    Diabetes Sister    Colon polyps Paternal Aunt    Colon cancer Neg Hx    Rectal cancer Neg Hx    Stomach cancer Neg Hx    Allergies  Allergen Reactions   Keflet [Cephalexin] Rash   Penicillins Rash   Sulfa Antibiotics Rash     Patient Care Team: Melida Quitter, PA as PCP - General (Family Medicine)   Outpatient Medications Prior to Visit  Medication Sig   Ez Smart Blood Glucose Lancets MISC Use to check fasting blood sugar   gabapentin (NEURONTIN) 300 MG capsule Take 1 capsule (300 mg total) by mouth 3 (three) times daily.   glucose blood test strip Use as instructed   ketoconazole (NIZORAL) 2 % cream Apply 1 Application topically daily.   linaGLIPtin-metFORMIN HCl ER (JENTADUETO XR) 08-998 MG TB24 Take 1 tablet by mouth daily.   lisinopril (ZESTRIL) 20 MG tablet Take 1 tablet (20 mg total) by mouth daily.   meloxicam (MOBIC) 7.5 MG tablet TAKE 1 TABLET BY MOUTH EVERY DAY AS NEEDED FOR PAIN   metoprolol succinate (TOPROL-XL) 100 MG 24 hr tablet Take 1 tablet (100 mg total) by mouth daily. TAKE WITH OR IMMEDIATELY FOLLOWING A MEAL.   pravastatin (PRAVACHOL) 20 MG tablet Take 1 tablet (20 mg total) by mouth daily.   traZODone (DESYREL) 50 MG tablet TAKE  0.5-1 TABLETS BY MOUTH AT BEDTIME AS NEEDED FOR SLEEP.   Vitamin D, Ergocalciferol, (DRISDOL) 1.25 MG (50000 UNIT) CAPS capsule Take 1 capsule on Wednesday and Sunday weekly   No facility-administered medications prior to visit.    Review of Systems  Constitutional:  Negative for chills, fever and malaise/fatigue.  HENT:  Negative for congestion and hearing loss.   Eyes:  Negative for blurred vision and double vision.  Respiratory:  Negative for cough and shortness of breath.   Cardiovascular:  Negative for chest pain, palpitations and leg swelling.  Gastrointestinal:  Negative for abdominal pain, constipation, diarrhea and heartburn.  Genitourinary:  Negative for frequency and urgency.  Musculoskeletal:  Negative for myalgias and neck pain.  Neurological:  Negative for headaches.  Endo/Heme/Allergies:  Negative for polydipsia.  Psychiatric/Behavioral:  Negative for depression. The patient is not nervous/anxious.       Objective:    BP (!) 147/90 (BP Location: Left Arm, Patient Position: Sitting, Cuff Size: Large)   Pulse 76   Resp 18   Ht 6\' 1"  (1.854 m)   Wt (!) 350 lb (158.8 kg)   SpO2 94%   BMI 46.18 kg/m    Physical Exam Constitutional:      General: He is not in acute distress.    Appearance: Normal appearance.  HENT:     Head: Normocephalic and atraumatic.     Right Ear: Tympanic membrane, ear canal and external ear normal. There is no impacted cerumen.     Left Ear: Tympanic membrane, ear canal and external ear normal. There is no impacted cerumen.     Nose: Nose normal.     Mouth/Throat:     Mouth: Mucous membranes are moist.     Pharynx: Oropharynx is clear. No posterior oropharyngeal erythema.  Eyes:     Extraocular Movements: Extraocular movements intact.     Conjunctiva/sclera: Conjunctivae normal.     Pupils: Pupils are equal, round, and reactive to light.  Neck:     Thyroid: No thyroid mass, thyromegaly or thyroid tenderness.  Cardiovascular:     Rate  and Rhythm: Normal rate and regular rhythm.     Heart sounds: Normal heart sounds. No murmur heard.    No friction rub. No gallop.  Pulmonary:     Effort: Pulmonary effort is normal. No respiratory distress.  Breath sounds: Normal breath sounds. No stridor. No wheezing or rales.  Abdominal:     General: Bowel sounds are normal.     Palpations: Abdomen is soft. There is no mass.     Tenderness: There is no abdominal tenderness.     Hernia: A hernia (reducible nontender umbilical) is present.  Musculoskeletal:        General: Normal range of motion.     Cervical back: Normal range of motion and neck supple.  Lymphadenopathy:     Cervical: No cervical adenopathy.  Skin:    General: Skin is warm and dry.  Neurological:     Mental Status: He is alert and oriented to person, place, and time.     Cranial Nerves: No cranial nerve deficit.     Motor: No weakness.     Deep Tendon Reflexes: Reflexes normal.  Psychiatric:        Mood and Affect: Mood normal.       Assessment & Plan:    Routine Health Maintenance and Physical Exam  Immunization History  Administered Date(s) Administered   Influenza,inj,Quad PF,6+ Mos 03/15/2019   PFIZER(Purple Top)SARS-COV-2 Vaccination 01/15/2020, 02/05/2020   Tdap 06/12/2021   Zoster Recombinant(Shingrix) 06/12/2021, 10/10/2021    Health Maintenance  Topic Date Due   Hepatitis C Screening  Never done   FOOT EXAM  12/18/2018   OPHTHALMOLOGY EXAM  02/17/2022   COVID-19 Vaccine (3 - 2023-24 season) 12/27/2022   INFLUENZA VACCINE  07/26/2023 (Originally 11/26/2022)   HEMOGLOBIN A1C  04/20/2023   Diabetic kidney evaluation - eGFR measurement  10/19/2023   Diabetic kidney evaluation - Urine ACR  10/19/2023   Colonoscopy  07/25/2024   DTaP/Tdap/Td (2 - Td or Tdap) 06/13/2031   HIV Screening  Completed   Zoster Vaccines- Shingrix  Completed   HPV VACCINES  Aged Out    Checking labs including CBC, CMP, lipid panel, A1C, TSH, and vitamin D.  Eye  exam up to date, requesting copies.  Discussed health benefits of physical activity, and encouraged him to engage in regular exercise appropriate for his age and condition.  Wellness examination  Hypertension associated with diabetes (HCC) Assessment & Plan: BP goal <130/80.  Above goal.  Encouraged ambulatory blood pressure monitoring and keeping blood pressure log for the next 2 weeks before a nurse visit to check blood pressure.  If still above goal, recommend adding hydrochlorothiazide.  Otherwise, continue lisinopril 20 mg daily, metoprolol succinate 100 mg daily.  Orders: -     Lipid panel -     Hemoglobin A1c -     CBC with Differential/Platelet -     Comprehensive metabolic panel  Mixed diabetic hyperlipidemia associated with type 2 diabetes mellitus (HCC) Assessment & Plan: Checking lipid panel today.  If LDL at goal of <100, continue PET-avid statin 20 mg daily.  Will continue to monitor.  Orders: -     Lipid panel -     Hemoglobin A1c -     CBC with Differential/Platelet -     Comprehensive metabolic panel  Type 2 diabetes mellitus with other specified complication, without long-term current use of insulin (HCC) Assessment & Plan: Most recent A1c 7.1, above goal.  Rechecking A1c today.  Continue linagliptin-metformin 08-998 mg daily unless lab results warrant change.  Will continue to monitor.  Orders: -     Lipid panel -     Hemoglobin A1c -     CBC with Differential/Platelet -     Comprehensive metabolic panel  Screening for viral disease -     Hepatitis C antibody; Future  Vitamin D deficiency -     VITAMIN D 25 Hydroxy (Vit-D Deficiency, Fractures) -     Lipid panel -     Hemoglobin A1c -     CBC with Differential/Platelet -     Comprehensive metabolic panel  Elevated liver enzymes -     Lipid panel -     Hemoglobin A1c -     CBC with Differential/Platelet -     Comprehensive metabolic panel  Umbilical hernia without obstruction and without  gangrene Assessment & Plan: Reducible, nontender.  Discussed monitoring for now, discussed red flag signs like pain, erythema, nonreducibility.  Patient verbalized understanding.    Return in 2 weeks for nurse BP check. Return in about 3 months (around 05/21/2023) for follow-up for HTN, HLD, DM, POC A1C at visit.     Melida Quitter, PA

## 2023-02-18 NOTE — Assessment & Plan Note (Signed)
Most recent A1c 7.1, above goal.  Rechecking A1c today.  Continue linagliptin-metformin 08-998 mg daily unless lab results warrant change.  Will continue to monitor.

## 2023-02-18 NOTE — Assessment & Plan Note (Signed)
Reducible, nontender.  Discussed monitoring for now, discussed red flag signs like pain, erythema, nonreducibility.  Patient verbalized understanding.

## 2023-02-18 NOTE — Assessment & Plan Note (Addendum)
Checking lipid panel today.  If LDL at goal of <100, continue pitavastatin 20 mg daily.  Will continue to monitor.

## 2023-02-18 NOTE — Assessment & Plan Note (Signed)
BP goal <130/80.  Above goal.  Encouraged ambulatory blood pressure monitoring and keeping blood pressure log for the next 2 weeks before a nurse visit to check blood pressure.  If still above goal, recommend adding hydrochlorothiazide.  Otherwise, continue lisinopril 20 mg daily, metoprolol succinate 100 mg daily.

## 2023-02-19 ENCOUNTER — Other Ambulatory Visit: Payer: Self-pay | Admitting: Family Medicine

## 2023-02-19 DIAGNOSIS — E1169 Type 2 diabetes mellitus with other specified complication: Secondary | ICD-10-CM

## 2023-02-19 LAB — CBC WITH DIFFERENTIAL/PLATELET
Basophils Absolute: 0.1 10*3/uL (ref 0.0–0.2)
Basos: 1 %
EOS (ABSOLUTE): 0.1 10*3/uL (ref 0.0–0.4)
Eos: 2 %
Hematocrit: 46.1 % (ref 37.5–51.0)
Hemoglobin: 14.8 g/dL (ref 13.0–17.7)
Immature Grans (Abs): 0 10*3/uL (ref 0.0–0.1)
Immature Granulocytes: 1 %
Lymphocytes Absolute: 1 10*3/uL (ref 0.7–3.1)
Lymphs: 28 %
MCH: 29.2 pg (ref 26.6–33.0)
MCHC: 32.1 g/dL (ref 31.5–35.7)
MCV: 91 fL (ref 79–97)
Monocytes Absolute: 0.3 10*3/uL (ref 0.1–0.9)
Monocytes: 8 %
Neutrophils Absolute: 2.2 10*3/uL (ref 1.4–7.0)
Neutrophils: 60 %
Platelets: 122 10*3/uL — ABNORMAL LOW (ref 150–450)
RBC: 5.06 x10E6/uL (ref 4.14–5.80)
RDW: 13.1 % (ref 11.6–15.4)
WBC: 3.6 10*3/uL (ref 3.4–10.8)

## 2023-02-19 LAB — LIPID PANEL
Chol/HDL Ratio: 4.5 ratio (ref 0.0–5.0)
Cholesterol, Total: 190 mg/dL (ref 100–199)
HDL: 42 mg/dL (ref >39)
LDL Chol Calc (NIH): 114 mg/dL — ABNORMAL HIGH (ref 0–99)
Triglycerides: 192 mg/dL — ABNORMAL HIGH (ref 0–149)
VLDL Cholesterol Cal: 34 mg/dL (ref 5–40)

## 2023-02-19 LAB — COMPREHENSIVE METABOLIC PANEL
ALT: 79 [IU]/L — ABNORMAL HIGH (ref 0–44)
AST: 70 [IU]/L — ABNORMAL HIGH (ref 0–40)
Albumin: 4.3 g/dL (ref 3.8–4.9)
Alkaline Phosphatase: 79 [IU]/L (ref 44–121)
BUN/Creatinine Ratio: 19 (ref 9–20)
BUN: 15 mg/dL (ref 6–24)
Bilirubin Total: 0.6 mg/dL (ref 0.0–1.2)
CO2: 22 mmol/L (ref 20–29)
Calcium: 9.4 mg/dL (ref 8.7–10.2)
Chloride: 98 mmol/L (ref 96–106)
Creatinine, Ser: 0.77 mg/dL (ref 0.76–1.27)
Globulin, Total: 3.1 g/dL (ref 1.5–4.5)
Glucose: 314 mg/dL — ABNORMAL HIGH (ref 70–99)
Potassium: 4.3 mmol/L (ref 3.5–5.2)
Sodium: 136 mmol/L (ref 134–144)
Total Protein: 7.4 g/dL (ref 6.0–8.5)
eGFR: 105 mL/min/{1.73_m2} (ref >59)

## 2023-02-19 LAB — HEPATITIS C ANTIBODY: Hep C Virus Ab: NONREACTIVE

## 2023-02-19 LAB — HEMOGLOBIN A1C
Est. average glucose Bld gHb Est-mCnc: 269 mg/dL
Hgb A1c MFr Bld: 11 % — ABNORMAL HIGH (ref 4.8–5.6)

## 2023-02-19 LAB — VITAMIN D 25 HYDROXY (VIT D DEFICIENCY, FRACTURES): Vit D, 25-Hydroxy: 26 ng/mL — ABNORMAL LOW (ref 30.0–100.0)

## 2023-02-19 MED ORDER — DAPAGLIFLOZIN PROPANEDIOL 5 MG PO TABS
5.0000 mg | ORAL_TABLET | Freq: Every day | ORAL | 2 refills | Status: DC
Start: 2023-02-19 — End: 2023-02-22

## 2023-02-19 NOTE — Addendum Note (Signed)
Addended by: Saralyn Pilar on: 02/19/2023 12:45 PM   Modules accepted: Orders

## 2023-02-22 MED ORDER — EMPAGLIFLOZIN 10 MG PO TABS
10.0000 mg | ORAL_TABLET | Freq: Every day | ORAL | 2 refills | Status: DC
Start: 2023-02-22 — End: 2023-05-21

## 2023-02-23 ENCOUNTER — Telehealth: Payer: Self-pay | Admitting: *Deleted

## 2023-02-23 NOTE — Telephone Encounter (Signed)
LVM for pt to call office to inform him of below and to get nurse visit scheduled per PCP.

## 2023-02-23 NOTE — Telephone Encounter (Signed)
-----   Message from Melida Quitter sent at 02/18/2023 10:46 AM EDT ----- Please call to schedule a nurse visit blood pressure check in 2 weeks and have him keep a blood pressure log to bring to that appointment as we discussed when he was here.  His blood pressure when it was rechecked was well above the goal of being less than 130 on the top and less than 80 on the bottom.  Thanks!

## 2023-03-03 ENCOUNTER — Ambulatory Visit (INDEPENDENT_AMBULATORY_CARE_PROVIDER_SITE_OTHER): Payer: 59 | Admitting: Family Medicine

## 2023-03-03 VITALS — BP 152/92 | HR 78

## 2023-03-03 DIAGNOSIS — I10 Essential (primary) hypertension: Secondary | ICD-10-CM

## 2023-03-03 NOTE — Progress Notes (Signed)
Patient is here for a blood pressure check

## 2023-03-04 ENCOUNTER — Ambulatory Visit: Payer: 59

## 2023-03-09 ENCOUNTER — Other Ambulatory Visit: Payer: Self-pay | Admitting: Nurse Practitioner

## 2023-03-09 DIAGNOSIS — E1169 Type 2 diabetes mellitus with other specified complication: Secondary | ICD-10-CM

## 2023-03-10 ENCOUNTER — Other Ambulatory Visit: Payer: Self-pay | Admitting: Nurse Practitioner

## 2023-03-10 DIAGNOSIS — E1169 Type 2 diabetes mellitus with other specified complication: Secondary | ICD-10-CM

## 2023-03-10 MED ORDER — JENTADUETO XR 5-1000 MG PO TB24
1.0000 | ORAL_TABLET | Freq: Every day | ORAL | 3 refills | Status: DC
Start: 2023-03-10 — End: 2023-05-21

## 2023-04-05 ENCOUNTER — Telehealth: Payer: Self-pay

## 2023-04-05 DIAGNOSIS — E1159 Type 2 diabetes mellitus with other circulatory complications: Secondary | ICD-10-CM

## 2023-04-05 MED ORDER — LISINOPRIL-HYDROCHLOROTHIAZIDE 20-12.5 MG PO TABS: 1.0000 | ORAL_TABLET | Freq: Every day | ORAL | 0 refills | Status: DC

## 2023-04-05 NOTE — Addendum Note (Signed)
Addended by: Saralyn Pilar on: 04/05/2023 04:13 PM   Modules accepted: Orders

## 2023-04-05 NOTE — Telephone Encounter (Signed)
If blood pressure has remained elevated, we will need to increase his blood pressure medication.  I have sent a prescription for a new combination of lisinopril and hydrochlorothiazide to [facility] in [place].  He should stop taking his old lisinopril only pill and instead start taking the combination pill

## 2023-04-05 NOTE — Telephone Encounter (Signed)
Patient has been notified and agrees with treatment recommendations. All questions and concerns have been addressed.

## 2023-04-05 NOTE — Telephone Encounter (Signed)
Pt stopped by the office after having his 2 wk BP check up and his BP was still elevated and was advised by Delice Bison that she would get back to him if any changes were going to be made. Pt is still logging BP but didn't have it with him. Next appt is 05/21/23.   Please advise

## 2023-04-06 ENCOUNTER — Other Ambulatory Visit: Payer: Self-pay | Admitting: Nurse Practitioner

## 2023-04-06 DIAGNOSIS — E1159 Type 2 diabetes mellitus with other circulatory complications: Secondary | ICD-10-CM

## 2023-05-03 ENCOUNTER — Other Ambulatory Visit: Payer: Self-pay | Admitting: Family Medicine

## 2023-05-03 DIAGNOSIS — I152 Hypertension secondary to endocrine disorders: Secondary | ICD-10-CM

## 2023-05-21 ENCOUNTER — Encounter: Payer: Self-pay | Admitting: Family Medicine

## 2023-05-21 ENCOUNTER — Ambulatory Visit: Payer: 59 | Admitting: Family Medicine

## 2023-05-21 VITALS — BP 128/82 | HR 80 | Ht 73.0 in | Wt 333.8 lb

## 2023-05-21 DIAGNOSIS — E782 Mixed hyperlipidemia: Secondary | ICD-10-CM

## 2023-05-21 DIAGNOSIS — Z7984 Long term (current) use of oral hypoglycemic drugs: Secondary | ICD-10-CM

## 2023-05-21 DIAGNOSIS — I152 Hypertension secondary to endocrine disorders: Secondary | ICD-10-CM

## 2023-05-21 DIAGNOSIS — E1159 Type 2 diabetes mellitus with other circulatory complications: Secondary | ICD-10-CM

## 2023-05-21 DIAGNOSIS — E1169 Type 2 diabetes mellitus with other specified complication: Secondary | ICD-10-CM | POA: Diagnosis not present

## 2023-05-21 LAB — POCT GLYCOSYLATED HEMOGLOBIN (HGB A1C)
HbA1c POC (<> result, manual entry): 11.2 % (ref 4.0–5.6)
Hemoglobin A1C: 11.2 % — AB (ref 4.0–5.6)

## 2023-05-21 MED ORDER — METOPROLOL SUCCINATE ER 100 MG PO TB24: 100.0000 mg | ORAL_TABLET | Freq: Every day | ORAL | 1 refills | Status: DC

## 2023-05-21 MED ORDER — EMPAGLIFLOZIN 10 MG PO TABS: 10.0000 mg | ORAL_TABLET | Freq: Every day | ORAL | 1 refills | Status: DC

## 2023-05-21 MED ORDER — PRAVASTATIN SODIUM 20 MG PO TABS: 20.0000 mg | ORAL_TABLET | Freq: Every day | ORAL | 1 refills | Status: DC

## 2023-05-21 MED ORDER — JENTADUETO XR 5-1000 MG PO TB24: 1.0000 | ORAL_TABLET | Freq: Every day | ORAL | 1 refills | Status: DC

## 2023-05-21 MED ORDER — GABAPENTIN 300 MG PO CAPS: 300.0000 mg | ORAL_CAPSULE | Freq: Two times a day (BID) | ORAL | 1 refills | Status: DC

## 2023-05-21 NOTE — Assessment & Plan Note (Addendum)
BP goal <130/80.  Now at goal.  Continue lisinopril-hydrochlorothiazide 20-12.5 mg daily, metoprolol succinate 100 mg daily.  Recheck CMP before next appointment.  Will continue to monitor.

## 2023-05-21 NOTE — Progress Notes (Signed)
Established Patient Office Visit  Subjective   Patient ID: Ricardo Wolf, male    DOB: 15-Jan-1966  Age: 58 y.o. MRN: [id number]  Chief Complaint  Patient presents with   Hypertension   Hyperlipidemia   Diabetes    HPI Ricardo Wolf is a 58 y.o. male presenting today for follow up of hypertension, hyperlipidemia, diabetes. Hypertension:  Pt denies chest pain, SOB, dizziness, edema, syncope, fatigue or heart palpitations. Taking lisinopril-hydrochlorothiazide now though he notes that he just started the combination dose 5 days ago due to having a lot of leftover losartan.  He continues to take metoprolol succinate as well.  Denies side effects. Hyperlipidemia: tolerating pravastatin 20 mg daily well with no myalgias or significant side effects.  The 10-year ASCVD risk score (Arnett DK, et al., 2019) is: 14.6% Diabetes: denies hypoglycemic events, wounds or sores that are not healing well, increased thirst or urination. Denies vision problems, eye exam completed at [facility]. Taking linagliptin-metformin and Jardiance as prescribed without any side effects.  He also notes that he ran out of the linagliptin-metformin for a while and has just restarted that prescription as well.   Outpatient Medications Prior to Visit  Medication Sig   Ez Smart Blood Glucose Lancets MISC Use to check fasting blood sugar   glucose blood test strip Use as instructed   ketoconazole (NIZORAL) 2 % cream Apply 1 Application topically daily.   lisinopril-hydrochlorothiazide (ZESTORETIC) 20-12.5 MG tablet TAKE 1 TABLET BY MOUTH EVERY DAY   meloxicam (MOBIC) 7.5 MG tablet TAKE 1 TABLET BY MOUTH EVERY DAY AS NEEDED FOR PAIN   traZODone (DESYREL) 50 MG tablet TAKE 0.5-1 TABLETS BY MOUTH AT BEDTIME AS NEEDED FOR SLEEP.   [DISCONTINUED] empagliflozin (JARDIANCE) 10 MG TABS tablet Take 1 tablet (10 mg total) by mouth daily before breakfast.   [DISCONTINUED] gabapentin (NEURONTIN) 300 MG capsule Take 1 capsule  (300 mg total) by mouth 3 (three) times daily.   [DISCONTINUED] linaGLIPtin-metFORMIN HCl ER (JENTADUETO XR) 08-998 MG TB24 Take 1 tablet by mouth daily.   [DISCONTINUED] metoprolol succinate (TOPROL-XL) 100 MG 24 hr tablet TAKE 1 TABLET BY MOUTH DAILY. TAKE WITH OR IMMEDIATELY FOLLOWING A MEAL.   [DISCONTINUED] pravastatin (PRAVACHOL) 20 MG tablet TAKE 1 TABLET BY MOUTH EVERY DAY   [DISCONTINUED] Vitamin D, Ergocalciferol, (DRISDOL) 1.25 MG (50000 UNIT) CAPS capsule Take 1 capsule on Wednesday and Sunday weekly (Patient not taking: Reported on 05/21/2023)   No facility-administered medications prior to visit.    ROS Negative unless otherwise noted in HPI   Objective:     BP 128/82   Pulse 80   Ht 6\' 1"  (1.854 m)   Wt (!) 333 lb 12 oz (151.4 kg)   SpO2 97%   BMI 44.03 kg/m   Physical Exam Constitutional:      General: He is not in acute distress.    Appearance: Normal appearance.  HENT:     Head: Normocephalic and atraumatic.  Cardiovascular:     Rate and Rhythm: Normal rate and regular rhythm.     Heart sounds: Normal heart sounds. No murmur heard.    No friction rub. No gallop.  Pulmonary:     Effort: Pulmonary effort is normal. No respiratory distress.     Breath sounds: Normal breath sounds. No wheezing, rhonchi or rales.  Skin:    General: Skin is warm and dry.  Neurological:     Mental Status: He is alert and oriented to person, place, and time.  Psychiatric:  Mood and Affect: Mood normal.    Diabetic Foot Exam - Simple   Simple Foot Form Diabetic Foot exam was performed with the following findings: Yes 05/21/2023  9:21 AM  Visual Inspection No deformities, no ulcerations, no other skin breakdown bilaterally: Yes Sensation Testing Intact to touch and monofilament testing bilaterally: Yes Pulse Check Posterior Tibialis and Dorsalis pulse intact bilaterally: Yes Comments    Results for orders placed or performed in visit on 05/21/23  POCT glycosylated  hemoglobin (Hb A1C)  Result Value Ref Range   Hemoglobin A1C 11.2 (A) 4.0 - 5.6 %   HbA1c POC (<> result, manual entry) 11.2 4.0 - 5.6 %   HbA1c, POC (prediabetic range)     HbA1c, POC (controlled diabetic range)       Assessment & Plan:  Hypertension associated with diabetes (HCC) Assessment & Plan: BP goal <130/80.  Now at goal.  Continue lisinopril-hydrochlorothiazide 20-12.5 mg daily, metoprolol succinate 100 mg daily.  Recheck CMP before next appointment.  Will continue to monitor.  Orders: -     Metoprolol Succinate ER; Take 1 tablet (100 mg total) by mouth daily. TAKE WITH OR IMMEDIATELY FOLLOWING A MEAL.  Dispense: 90 tablet; Refill: 1  Mixed diabetic hyperlipidemia associated with type 2 diabetes mellitus (HCC) Assessment & Plan: Last lipid panel: LDL 114, HDL 42, triglycerides 192.  Recheck lipid panel prior to next appointment.  At that time, if LDL at goal of <100, continue pitavastatin 20 mg daily.  If LDL above goal, increase dose.  Will continue to monitor.  Orders: -     Pravastatin Sodium; Take 1 tablet (20 mg total) by mouth daily.  Dispense: 90 tablet; Refill: 1  Type 2 diabetes mellitus with other specified complication, without long-term current use of insulin (HCC) Assessment & Plan: A1c 11.2, remains way above goal.  Recheck A1c in 3 months after returning to usual medication routine, sent enough refills to get him through the next appointment.  He also had questions about Vital gummies after seeing a video about it and hearing that they are helpful for blood pressure and diabetes.  Reviewed ingredients, discussed that there is mixed data regarding the efficacy of apple cider vinegar and beet juice in glycemic control.  We may discuss this further at his next appointment as well.  [facility] [place].  Orders: -     Empagliflozin; Take 1 tablet (10 mg total) by mouth daily before breakfast.  Dispense: 90 tablet;  Refill: 1 -     Jentadueto XR; Take 1 tablet by mouth daily.  Dispense: 90 tablet; Refill: 1 -     POCT glycosylated hemoglobin (Hb A1C)  Other orders -     Gabapentin; Take 1 capsule (300 mg total) by mouth 2 (two) times daily.  Dispense: 180 capsule; Refill: 1    Return in about 3 months (around 08/19/2023) for follow-up for HTN, HLD, DM, fasting labs 1 week before.    Melida Quitter, PA

## 2023-05-21 NOTE — Assessment & Plan Note (Signed)
Last lipid panel: LDL 114, HDL 42, triglycerides 192.  Recheck lipid panel prior to next appointment.  At that time, if LDL at goal of <100, continue pitavastatin 20 mg daily.  If LDL above goal, increase dose.  Will continue to monitor.

## 2023-05-21 NOTE — Patient Instructions (Signed)
BLOOD PRESSURE: For the next week, check your blood pressure twice a day: Once about 1 hour after taking your blood pressure medicine, and a second time in the evening.  Keep a log and send a picture over MyChart or drop the paper copy off at our front office around next Friday.  BLOOD SUGAR: We will keep your linagliptin-metformin and Jardiance prescriptions the same for right now.  We will give it a few months after getting back into this routine before we recheck your A1c.

## 2023-05-21 NOTE — Assessment & Plan Note (Addendum)
A1c 11.2, remains way above goal.  Recheck A1c in 3 months after returning to usual medication routine, sent enough refills to get him through the next appointment.  He also had questions about Vital gummies after seeing a video about it and hearing that they are helpful for blood pressure and diabetes.  Reviewed ingredients, discussed that there is mixed data regarding the efficacy of apple cider vinegar and beet juice in glycemic control.  We may discuss this further at his next appointment as well.  Requesting copy of [facility] exam from [facility] [place].

## 2023-06-03 ENCOUNTER — Encounter: Payer: Self-pay | Admitting: Family Medicine

## 2023-08-18 ENCOUNTER — Other Ambulatory Visit: Payer: Self-pay | Admitting: *Deleted

## 2023-08-18 DIAGNOSIS — E559 Vitamin D deficiency, unspecified: Secondary | ICD-10-CM

## 2023-08-18 DIAGNOSIS — E1159 Type 2 diabetes mellitus with other circulatory complications: Secondary | ICD-10-CM

## 2023-08-18 DIAGNOSIS — E1169 Type 2 diabetes mellitus with other specified complication: Secondary | ICD-10-CM

## 2023-08-19 ENCOUNTER — Other Ambulatory Visit: Payer: 59

## 2023-08-19 DIAGNOSIS — E559 Vitamin D deficiency, unspecified: Secondary | ICD-10-CM

## 2023-08-19 DIAGNOSIS — E1159 Type 2 diabetes mellitus with other circulatory complications: Secondary | ICD-10-CM

## 2023-08-19 DIAGNOSIS — E1169 Type 2 diabetes mellitus with other specified complication: Secondary | ICD-10-CM

## 2023-08-20 LAB — CBC WITH DIFFERENTIAL/PLATELET
Basophils Absolute: 0 10*3/uL (ref 0.0–0.2)
Basos: 0 %
EOS (ABSOLUTE): 0.1 10*3/uL (ref 0.0–0.4)
Eos: 1 %
Hematocrit: 49.2 % (ref 37.5–51.0)
Hemoglobin: 15.4 g/dL (ref 13.0–17.7)
Immature Grans (Abs): 0 10*3/uL (ref 0.0–0.1)
Immature Granulocytes: 0 %
Lymphocytes Absolute: 1.4 10*3/uL (ref 0.7–3.1)
Lymphs: 20 %
MCH: 27.9 pg (ref 26.6–33.0)
MCHC: 31.3 g/dL — ABNORMAL LOW (ref 31.5–35.7)
MCV: 89 fL (ref 79–97)
Monocytes Absolute: 0.5 10*3/uL (ref 0.1–0.9)
Monocytes: 7 %
Neutrophils Absolute: 4.9 10*3/uL (ref 1.4–7.0)
Neutrophils: 72 %
Platelets: 158 10*3/uL (ref 150–450)
RBC: 5.51 x10E6/uL (ref 4.14–5.80)
RDW: 13.2 % (ref 11.6–15.4)
WBC: 7 10*3/uL (ref 3.4–10.8)

## 2023-08-20 LAB — COMPREHENSIVE METABOLIC PANEL WITH GFR
ALT: 54 IU/L — ABNORMAL HIGH (ref 0–44)
AST: 47 IU/L — ABNORMAL HIGH (ref 0–40)
Albumin: 4.5 g/dL (ref 3.8–4.9)
Alkaline Phosphatase: 78 IU/L (ref 44–121)
BUN/Creatinine Ratio: 29 — ABNORMAL HIGH (ref 9–20)
BUN: 23 mg/dL (ref 6–24)
Bilirubin Total: 0.6 mg/dL (ref 0.0–1.2)
CO2: 20 mmol/L (ref 20–29)
Calcium: 9.6 mg/dL (ref 8.7–10.2)
Chloride: 97 mmol/L (ref 96–106)
Creatinine, Ser: 0.79 mg/dL (ref 0.76–1.27)
Globulin, Total: 3 g/dL (ref 1.5–4.5)
Glucose: 197 mg/dL — ABNORMAL HIGH (ref 70–99)
Potassium: 4.4 mmol/L (ref 3.5–5.2)
Sodium: 134 mmol/L (ref 134–144)
Total Protein: 7.5 g/dL (ref 6.0–8.5)
eGFR: 104 mL/min/{1.73_m2} (ref >59)

## 2023-08-20 LAB — LIPID PANEL
Chol/HDL Ratio: 4.3 ratio (ref 0.0–5.0)
Cholesterol, Total: 183 mg/dL (ref 100–199)
HDL: 43 mg/dL (ref >39)
LDL Chol Calc (NIH): 105 mg/dL — ABNORMAL HIGH (ref 0–99)
Triglycerides: 202 mg/dL — ABNORMAL HIGH (ref 0–149)
VLDL Cholesterol Cal: 35 mg/dL (ref 5–40)

## 2023-08-20 LAB — HEMOGLOBIN A1C
Est. average glucose Bld gHb Est-mCnc: 235 mg/dL
Hgb A1c MFr Bld: 9.8 % — ABNORMAL HIGH (ref 4.8–5.6)

## 2023-08-20 LAB — TSH: TSH: 2.25 u[IU]/mL (ref 0.450–4.500)

## 2023-08-20 LAB — VITAMIN D 25 HYDROXY (VIT D DEFICIENCY, FRACTURES): Vit D, 25-Hydroxy: 24.2 ng/mL — ABNORMAL LOW (ref 30.0–100.0)

## 2023-08-25 ENCOUNTER — Ambulatory Visit: Payer: 59 | Admitting: Family Medicine

## 2023-08-30 ENCOUNTER — Ambulatory Visit: Admitting: Family Medicine

## 2023-08-30 ENCOUNTER — Encounter: Payer: Self-pay | Admitting: Family Medicine

## 2023-08-30 VITALS — BP 118/82 | HR 78 | Temp 98.3°F | Ht 73.5 in | Wt 330.0 lb

## 2023-08-30 DIAGNOSIS — R809 Proteinuria, unspecified: Secondary | ICD-10-CM

## 2023-08-30 DIAGNOSIS — E1169 Type 2 diabetes mellitus with other specified complication: Secondary | ICD-10-CM | POA: Diagnosis not present

## 2023-08-30 DIAGNOSIS — E1159 Type 2 diabetes mellitus with other circulatory complications: Secondary | ICD-10-CM

## 2023-08-30 DIAGNOSIS — E1129 Type 2 diabetes mellitus with other diabetic kidney complication: Secondary | ICD-10-CM | POA: Diagnosis not present

## 2023-08-30 DIAGNOSIS — I152 Hypertension secondary to endocrine disorders: Secondary | ICD-10-CM | POA: Diagnosis not present

## 2023-08-30 DIAGNOSIS — M25531 Pain in right wrist: Secondary | ICD-10-CM

## 2023-08-30 MED ORDER — LISINOPRIL 20 MG PO TABS: 20.0000 mg | ORAL_TABLET | Freq: Every day | ORAL | 3 refills | Status: AC

## 2023-08-30 MED ORDER — METFORMIN HCL ER 500 MG PO TB24: 1000.0000 mg | ORAL_TABLET | Freq: Every day | ORAL | 1 refills | Status: DC

## 2023-08-30 MED ORDER — TIRZEPATIDE 2.5 MG/0.5ML ~~LOC~~ SOAJ: 2.5000 mg | SUBCUTANEOUS | 1 refills | Status: DC

## 2023-08-30 MED ORDER — AMLODIPINE BESYLATE 5 MG PO TABS: 5.0000 mg | ORAL_TABLET | Freq: Every day | ORAL | 3 refills | Status: AC

## 2023-08-30 NOTE — Assessment & Plan Note (Signed)
 Stopping lisinopril -HCTZ, prescribing lisinopril  20 mg and new prescription for amlodipine 5 mg due to his increased urinary frequency which could be due to the hydrochlorothiazide .

## 2023-08-30 NOTE — Patient Instructions (Addendum)
 It was nice to see you today,  We addressed the following topics today: -We are prescribing Mounjaro for you.  This is similar to Trulicity .  Once you receive Mounjaro you can start taking the metformin  by itself that I have sent in.  Until you get Mounjaro or Ozempic or similar, continue taking your current metformin - linagliptan medicine. - I am sending in lisinopril  by itself and also amlodipine.  The hydrochlorothiazide  might be what is making you urinate so frequently.stop taking the blood pressure medication with hydrochlorthiazide in it when you start the amlodipine and lisinopril  - Try using over-the-counter nocturnal wrist splint for carpal tunnel syndrome.  You can find this in the pharmacy in the first-aid section usually.   Have a great day,  Etha Henle, MD

## 2023-08-30 NOTE — Progress Notes (Signed)
   Established Patient Office Visit  Subjective   Patient ID: Ricardo Wolf, male    DOB: Nov 29, 1965  Age: 58 y.o. MRN: [id number]  Chief Complaint  Patient presents with   Follow-up    HPI  Subjective - Wrist pain x 2 weeks, no numbness or tingling, no injury - Reports waking up with wrist pain, likely due to sleeping position with hand behind head - Pain improves when changing position  - Increased urinary frequency over past 3-4 months, urinating 7-8 times daily - Sudden urge to urinate - No difficulty starting/stopping stream, no weak stream  - Previously took Trulicity  but stopped when there was issues with supplies and he had difficulty getting it. - Bowel irregularity with alternating diarrhea and constipation  - Heel pain consistent with plantar fasciitis  Medications: lisinopril -HCTZ, Jardiance , metformin -linagliptin , metoprolol . Previously on Trulicity  but discontinued due to supply issues.  PMH: hypertension, diabetes, back pain s/p three surgeries, plantar fasciitis  Social Hx: Lives in [place] area  ROS: Denies numbness or tingling in wrist. Reports urinary frequency without dysuria. Reports alternating diarrhea and constipation.  The 10-year ASCVD risk score (Arnett DK, et al., 2019) is: 13%  Health Maintenance Due  Topic Date Due   Pneumococcal Vaccine 47-33 Years old (1 of 2 - PCV) Never done   OPHTHALMOLOGY EXAM  02/17/2022   COVID-19 Vaccine (3 - 2024-25 season) 12/27/2022      Objective:     BP 118/82   Pulse 78   Temp 98.3 F (36.8 C) (Oral)   Ht 6' 1.5" (1.867 m)   Wt (!) 330 lb (149.7 kg)   SpO2 94%   BMI 42.95 kg/m    Physical Exam Gen: alert, oriented Cv: rrr,  Pulm: lctab   No results found for any visits on 08/30/23.      Assessment & Plan:   Type 2 diabetes mellitus with other specified complication, without long-term current use of insulin (HCC) Assessment & Plan: Stopping Jentadueto .  Sending in prescription for  1000 mg metformin  XR.  Adding Mounjaro.  Increase Mounjaro as tolerated.  Getting UACR today.  Follow-up in 3 months.  Continue Jardiance .   Microalbuminuria due to type 2 diabetes mellitus (HCC) -     Microalbumin / creatinine urine ratio  Hypertension associated with diabetes (HCC) Assessment & Plan: Stopping lisinopril -HCTZ, prescribing lisinopril  20 mg and new prescription for amlodipine 5 mg due to his increased urinary frequency which could be due to the hydrochlorothiazide .   Wrist pain, acute, right Assessment & Plan: Possibly due to carpal tunnel syndrome or just positioning during sleep.  Recommend that he try a carpal tunnel nocturnal splint.   Other orders -     amLODIPine Besylate; Take 1 tablet (5 mg total) by mouth daily.  Dispense: 90 tablet; Refill: 3 -     Lisinopril ; Take 1 tablet (20 mg total) by mouth daily.  Dispense: 90 tablet; Refill: 3 -     Tirzepatide; Inject 2.5 mg into the skin once a week.  Dispense: 2 mL; Refill: 1 -     metFORMIN  HCl ER; Take 2 tablets (1,000 mg total) by mouth daily with breakfast.  Dispense: 180 tablet; Refill: 1   I spent 33 minutes in the management of this patient.  Return in about 3 months (around 11/30/2023) for DM, hld.    Laneta Pintos, MD

## 2023-08-30 NOTE — Assessment & Plan Note (Addendum)
 Stopping Jentadueto .  Sending in prescription for 1000 mg metformin  XR.  Adding Mounjaro.  Increase Mounjaro as tolerated.  Getting UACR today.  Follow-up in 3 months.  Continue Jardiance .

## 2023-08-30 NOTE — Assessment & Plan Note (Signed)
 Possibly due to carpal tunnel syndrome or just positioning during sleep.  Recommend that he try a carpal tunnel nocturnal splint.

## 2023-08-31 LAB — MICROALBUMIN / CREATININE URINE RATIO
Creatinine, Urine: 41.4 mg/dL
Microalb/Creat Ratio: 27 mg/g{creat} (ref 0–29)
Microalbumin, Urine: 11.3 ug/mL

## 2023-09-01 ENCOUNTER — Encounter (HOSPITAL_COMMUNITY): Payer: Self-pay | Admitting: *Deleted

## 2023-09-01 ENCOUNTER — Other Ambulatory Visit: Payer: Self-pay

## 2023-09-01 ENCOUNTER — Emergency Department (HOSPITAL_COMMUNITY)
Admission: EM | Admit: 2023-09-01 | Discharge: 2023-09-01 | Disposition: A | Discharge status: Home / Self Care | Payer: Worker's Compensation | Attending: Emergency Medicine | Admitting: Emergency Medicine

## 2023-09-01 DIAGNOSIS — S31153A Open bite of abdominal wall, right lower quadrant without penetration into peritoneal cavity, initial encounter: Secondary | ICD-10-CM | POA: Diagnosis present

## 2023-09-01 DIAGNOSIS — E119 Type 2 diabetes mellitus without complications: Secondary | ICD-10-CM | POA: Insufficient documentation

## 2023-09-01 DIAGNOSIS — Z7984 Long term (current) use of oral hypoglycemic drugs: Secondary | ICD-10-CM | POA: Diagnosis not present

## 2023-09-01 DIAGNOSIS — Z23 Encounter for immunization: Secondary | ICD-10-CM | POA: Insufficient documentation

## 2023-09-01 DIAGNOSIS — W540XXA Bitten by dog, initial encounter: Secondary | ICD-10-CM | POA: Diagnosis not present

## 2023-09-01 DIAGNOSIS — Y92029 Unspecified place in mobile home as the place of occurrence of the external cause: Secondary | ICD-10-CM | POA: Diagnosis not present

## 2023-09-01 MED ORDER — DOXYCYCLINE HYCLATE 100 MG PO CAPS: 100.0000 mg | ORAL_CAPSULE | Freq: Two times a day (BID) | ORAL | 0 refills | Status: AC

## 2023-09-01 MED ORDER — RABIES VACCINE, PCEC IM SUSR
1.0000 mL | Freq: Once | INTRAMUSCULAR | Status: AC
  Administered 2023-09-01: 1 mL via INTRAMUSCULAR
  Filled 2023-09-01: qty 1

## 2023-09-01 MED ORDER — RABIES IMMUNE GLOBULIN 150 UNIT/ML IM INJ
20.0000 [IU]/kg | INJECTION | Freq: Once | INTRAMUSCULAR | Status: AC
  Administered 2023-09-01: 3000 [IU] via INTRAMUSCULAR
  Filled 2023-09-01: qty 20

## 2023-09-01 MED ORDER — METRONIDAZOLE 500 MG PO TABS: 500.0000 mg | ORAL_TABLET | Freq: Three times a day (TID) | ORAL | 0 refills | Status: AC

## 2023-09-01 NOTE — ED Triage Notes (Signed)
 Animal control was contacted and was on the scene

## 2023-09-01 NOTE — Discharge Instructions (Addendum)
 You will need to have subsequent injections in order to treat rabies.  The first shot was given today, you will need to have an injection on day 3, day 7, day 14, and day 28.  You were given a prescription for antibiotics to help treat infection.  Please take these medications as prescribed.  If you experience any redness, fever, worsening symptoms please return to the emergency department.

## 2023-09-01 NOTE — ED Provider Notes (Signed)
 [place] EMERGENCY DEPARTMENT AT [facility] Provider Note   CSN: [id number] Arrival date & time: 09/01/23  1831     History DM Chief Complaint  Patient presents with   Animal Bite    Ricardo Wolf is a 58 y.o. male.  58 year old male with a past medical history of diabetes presents to the ED status post dog bite.  Patient reports he was in a camper, when suddenly a stranger's dog came and bit him in the abdomen.  He also have a small scratch to the left leg.  He reports he does not know if the dog has any vaccines.  His last tetanus immunization according to records was in 2023.  He has not taken anything for pain control at this time as he drove here.  No other complaints reported. Animal control was contacted.   The history is provided by the patient.  Animal Bite Associated symptoms: no fever        Home Medications Prior to Admission medications   Medication Sig Start Date End Date Taking? Authorizing Provider  doxycycline (VIBRAMYCIN) 100 MG capsule Take 1 capsule (100 mg total) by mouth 2 (two) times daily for 7 days. 09/01/23 09/08/23 Yes Wateen Varon, PA-C  metroNIDAZOLE (FLAGYL) 500 MG tablet Take 1 tablet (500 mg total) by mouth every 8 (eight) hours for 7 days. 09/01/23 09/08/23 Yes Hong Timm, PA-C  amLODipine (NORVASC) 5 MG tablet Take 1 tablet (5 mg total) by mouth daily. 08/30/23   Laneta Pintos, MD  empagliflozin  (JARDIANCE ) 10 MG TABS tablet Take 1 tablet (10 mg total) by mouth daily before breakfast. 05/21/23   Noreene Bearded, PA  Ez Smart Blood Glucose Lancets MISC Use to check fasting blood sugar 05/06/20   Abonza, Maritza, PA-C  gabapentin  (NEURONTIN ) 300 MG capsule Take 1 capsule (300 mg total) by mouth 2 (two) times daily. 05/21/23   Maryclare Smoke A, PA  glucose blood test strip Use as instructed 05/06/20   Abonza, Maritza, PA-C  ketoconazole  (NIZORAL ) 2 % cream Apply 1 Application topically daily. 11/02/22   Standiford, Karlene Overcast, DPM  lisinopril   (ZESTRIL ) 20 MG tablet Take 1 tablet (20 mg total) by mouth daily. 08/30/23   Laneta Pintos, MD  meloxicam  (MOBIC ) 7.5 MG tablet TAKE 1 TABLET BY MOUTH EVERY DAY AS NEEDED FOR PAIN 12/12/21   Abonza, Maritza, PA-C  metFORMIN  (GLUCOPHAGE -XR) 500 MG 24 hr tablet Take 2 tablets (1,000 mg total) by mouth daily with breakfast. 08/30/23   Laneta Pintos, MD  metoprolol  succinate (TOPROL -XL) 100 MG 24 hr tablet Take 1 tablet (100 mg total) by mouth daily. TAKE WITH OR IMMEDIATELY FOLLOWING A MEAL. 05/21/23   Noreene Bearded, PA  pravastatin  (PRAVACHOL ) 20 MG tablet Take 1 tablet (20 mg total) by mouth daily. 05/21/23   Noreene Bearded, PA  tirzepatide [facility]) 2.5 MG/0.5ML Pen Inject 2.5 mg into the skin once a week. 08/30/23   Laneta Pintos, MD  traZODone  (DESYREL ) 50 MG tablet TAKE 0.5-1 TABLETS BY MOUTH AT BEDTIME AS NEEDED FOR SLEEP. 03/04/22   Abonza, Maritza, PA-C      Allergies    Keflet [cephalexin], Penicillins, and Sulfa antibiotics    Review of Systems   Review of Systems  Constitutional:  Negative for chills and fever.  Respiratory:  Negative for shortness of breath.   Cardiovascular:  Negative for chest pain.  Gastrointestinal:  Negative for abdominal pain, nausea and vomiting.  Genitourinary:  Negative for flank pain.  Musculoskeletal:  Negative for back pain.  Skin:  Positive for wound.  All other systems reviewed and are negative.   Physical Exam Updated Vital Signs BP (!) 145/91   Pulse (!) 107   Temp 99.4 F (37.4 C)   Resp 16   Ht 6\' 1"  (1.854 m)   Wt (!) 149.7 kg   SpO2 96%   BMI 43.54 kg/m  Physical Exam Vitals and nursing note reviewed.  Constitutional:      Appearance: Normal appearance.  HENT:     Head: Normocephalic and atraumatic.     Mouth/Throat:     Mouth: Mucous membranes are moist.  Cardiovascular:     Rate and Rhythm: Normal rate.  Pulmonary:     Effort: Pulmonary effort is normal.  Abdominal:     General: Abdomen is flat.  Musculoskeletal:      Cervical back: Normal range of motion and neck supple.  Skin:    General: Skin is warm.     Findings: Erythema present.     Comments: Small wound noted to the right lower abdomen, no active bleeding noted, surrounding erythema.  Neurological:     Mental Status: He is alert and oriented to person, place, and time.     ED Results / Procedures / Treatments   Labs (all labs ordered are listed, but only abnormal results are displayed) Labs Reviewed - No data to display  EKG None  Radiology No results found.  Procedures Procedures    Medications Ordered in ED Medications  rabies immune globulin (HYPERRAB/KEDRAB) injection 3,000 Units (3,000 Units Intramuscular Given 09/01/23 2004)  rabies vaccine (RABAVERT) injection 1 mL (1 mL Intramuscular Given 09/01/23 2005)    ED Course/ Medical Decision Making/ A&P                                 Medical Decision Making Risk Prescription drug management.    Patient with a PMH of DM sent to the ED status post dog bite, reports he was at his camper when suddenly he was bitten by a stranger's dog, he does not know the status of the dog's vaccines.  His last instrumentation was in 2023 according to his records.  His vitals are within normal limits, he does have underlying history of diabetes.   Evaluation there is a small puncture wound noted to the right lower abdomen, no penetrating wound noted.  He also has a small pinpoint wound to the left thigh.  Neither of these are actively bleeding. He was given his first round of vaccines today. He is agreeable of follow up with urgent care in order to obtain subsequent vaccines.  Patient stable for discharge.   Portions of this note were generated with Scientist, clinical (histocompatibility and immunogenetics). Dictation errors may occur despite best attempts at proofreading.   Final Clinical Impression(s) / ED Diagnoses Final diagnoses:  Dog bite, initial encounter    Rx / DC Orders ED Discharge Orders          Ordered     metroNIDAZOLE (FLAGYL) 500 MG tablet  Every 8 hours        09/01/23 2031    doxycycline (VIBRAMYCIN) 100 MG capsule  2 times daily        09/01/23 2031              Pharrah Rottman, PA-C 09/01/23 2036    Kingsley, Victoria K, DO 09/01/23 2108

## 2023-09-01 NOTE — ED Triage Notes (Signed)
 The  pt was bitten to his rt lower abd 1445 today he also has a scratch ot the lt upper leg also  minimal bleeding  the dogs were mixed with  pitt bull    they were a strangers dog

## 2023-09-03 ENCOUNTER — Telehealth: Payer: Self-pay

## 2023-09-03 NOTE — Telephone Encounter (Signed)
 Pt had questions about which medications he should stop taking and continue to take from his 5/5 office visit. Pt reviewed medication changes with the pt and he verbalized understanding. All questions were answered.  Pt was seen in the ED 5/7 for a dog bite. RN reviewed with pt where he can go to receive the rabies series. Pt verbalized understanding.    Copied from CRM [id number]. Topic: Clinical - Medication Question >> Sep 03, 2023  1:35 PM Rosamond Comes wrote: Reason for CRM: patient calling in patient was in office on 08/30/23 with Dr Arabella Beach, who prescribed new medication, Patient was told to stop taking certain mediation and start the new. Patient doesn't remember which one's to take and not to take.  Patient would like a call back regarding medication questions.  Patient phone [phone] ok to leave detailed messge >> Sep 03, 2023  1:44 PM Rosamond Comes wrote: Patient is asking about a 2nd rabies shot, patient was in ED for 2 dog bites. Was given 1st rabies injection and was told to go back 3 days to get the 2nd injection

## 2023-09-04 ENCOUNTER — Ambulatory Visit (HOSPITAL_BASED_OUTPATIENT_CLINIC_OR_DEPARTMENT_OTHER)
Admission: EM | Admit: 2023-09-04 | Discharge: 2023-09-04 | Disposition: A | Discharge status: Home / Self Care | Attending: Family Medicine | Admitting: Family Medicine

## 2023-09-04 DIAGNOSIS — Z203 Contact with and (suspected) exposure to rabies: Secondary | ICD-10-CM

## 2023-09-04 MED ORDER — RABIES VACCINE, PCEC IM SUSR
1.0000 mL | Freq: Once | INTRAMUSCULAR | Status: AC
  Administered 2023-09-04: 1 mL via INTRAMUSCULAR

## 2023-09-04 NOTE — ED Triage Notes (Signed)
 Here for follow up Rabies vaccine . Seen at [facility] on 5/7. No problems with healing.

## 2023-09-06 ENCOUNTER — Ambulatory Visit

## 2023-09-08 ENCOUNTER — Ambulatory Visit (HOSPITAL_BASED_OUTPATIENT_CLINIC_OR_DEPARTMENT_OTHER)
Admission: EM | Admit: 2023-09-08 | Discharge: 2023-09-08 | Disposition: A | Discharge status: Home / Self Care | Attending: Family Medicine | Admitting: Family Medicine

## 2023-09-08 DIAGNOSIS — Z203 Contact with and (suspected) exposure to rabies: Secondary | ICD-10-CM | POA: Diagnosis not present

## 2023-09-08 MED ORDER — RABIES VACCINE, PCEC IM SUSR
1.0000 mL | Freq: Once | INTRAMUSCULAR | Status: AC
  Administered 2023-09-08: 1 mL via INTRAMUSCULAR

## 2023-09-08 NOTE — ED Triage Notes (Signed)
 No difficulty with wound healing.

## 2023-09-15 ENCOUNTER — Encounter (HOSPITAL_BASED_OUTPATIENT_CLINIC_OR_DEPARTMENT_OTHER): Payer: Self-pay | Admitting: Family Medicine

## 2023-09-15 ENCOUNTER — Ambulatory Visit (HOSPITAL_BASED_OUTPATIENT_CLINIC_OR_DEPARTMENT_OTHER)
Admission: EM | Admit: 2023-09-15 | Discharge: 2023-09-15 | Disposition: A | Discharge status: Home / Self Care | Attending: Family Medicine | Admitting: Family Medicine

## 2023-09-15 DIAGNOSIS — Z203 Contact with and (suspected) exposure to rabies: Secondary | ICD-10-CM | POA: Diagnosis not present

## 2023-09-15 DIAGNOSIS — W540XXA Bitten by dog, initial encounter: Secondary | ICD-10-CM

## 2023-09-15 MED ORDER — RABIES VACCINE, PCEC IM SUSR
1.0000 mL | Freq: Once | INTRAMUSCULAR | Status: AC
  Administered 2023-09-15: 1 mL via INTRAMUSCULAR

## 2023-09-15 NOTE — ED Provider Notes (Signed)
 [facility]    CSN: [id number] Arrival date & time: 09/15/23  4098      History   Chief Complaint Chief Complaint  Patient presents with   Rabies Vaccine     HPI Ricardo Wolf is Wolf 58 y.o. male.   Here for nursing visit for last Rabies vaccine  after Wolf dog bite.  He was initially seen on 08/31/23.  This is his last visit for his last rabies vaccine .  He had Wolf scratch on one leg and the bite was in the abdomen.  All have healed well.  He was treated with oral Doxycycline  but the [facility] ER.     Past Medical History:  Diagnosis Date   Allergy    pollen   Arthritis    back,shoulders,knees   Diabetes (HCC)    Elevated liver enzymes 12/05/2018   Family history of secondary lung cancer- father late 69s, heavy smoker 09/10/2018   GERD (gastroesophageal reflux disease)    High cholesterol    History of smoking - 75-pack-year history; quit Oct 03, 2005 09/10/2018   Tobacco Use - Former Smoker  Started smoking in 10-03[birth year]1977 at age 47, quit in 10/03/05.  30 year history of smoking at up to 3 ppd.   Averaged 2.5 ppd.  ~75 pack-year history.   Father was Wolf heavy smoker; had lung cancer. He retired at age 64, found out he had cancer at age 61; died at age 39 (in Oct 03, 2005).  Patient quit smoking the day his father died.     Hypertension    Tooth abscess 11/17/2018    Patient Active Problem List   Diagnosis Date Noted   Wrist pain, acute, right 08/30/2023   Umbilical hernia 02/18/2023   Bilateral sciatica 10/26/2022   Microalbuminuria due to type 2 diabetes mellitus (HCC) 09/10/2018   Vitamin D  deficiency 12/17/2017   Diabetes mellitus (HCC) 11/10/2017   Hypertension associated with diabetes (HCC) 11/10/2017   Mixed diabetic hyperlipidemia associated with type 2 diabetes mellitus (HCC) 11/10/2017   Morbid obesity (HCC)-  BMI 46.2 11/10/2017    Past Surgical History:  Procedure Laterality Date   BACK SURGERY  10/03/2001   BACK SURGERY  2009-10-03   BACK SURGERY     Oct 03, 2017       Home  Medications    Prior to Admission medications   Medication Sig Start Date End Date Taking? Authorizing Provider  amLODipine  (NORVASC ) 5 MG tablet Take 1 tablet (5 mg total) by mouth daily. 08/30/23   Ricardo Pintos, MD  empagliflozin  (JARDIANCE ) 10 MG TABS tablet Take 1 tablet (10 mg total) by mouth daily before breakfast. 05/21/23   Ricardo Bearded, PA  Ez Smart Blood Glucose Lancets MISC Use to check fasting blood sugar 05/06/20   Ricardo, Maritza, PA-C  gabapentin  (NEURONTIN ) 300 MG capsule Take 1 capsule (300 mg total) by mouth 2 (two) times daily. 05/21/23   Ricardo Smoke A, PA  glucose blood test strip Use as instructed 05/06/20   Ricardo, Maritza, PA-C  ketoconazole  (NIZORAL ) 2 % cream Apply 1 Application topically daily. 11/02/22   Ricardo, Karlene Wolf, DPM  lisinopril  (ZESTRIL ) 20 MG tablet Take 1 tablet (20 mg total) by mouth daily. 08/30/23   Ricardo Pintos, MD  meloxicam  (MOBIC ) 7.5 MG tablet TAKE 1 TABLET BY MOUTH EVERY DAY AS NEEDED FOR PAIN 12/12/21   Ricardo, Maritza, PA-C  metFORMIN  (GLUCOPHAGE -XR) 500 MG 24 hr tablet Take 2 tablets (1,000 mg total) by mouth daily with breakfast. 08/30/23   Ricardo Wolf,  Ricardo Ribas, MD  metoprolol  succinate (TOPROL -XL) 100 MG 24 hr tablet Take 1 tablet (100 mg total) by mouth daily. TAKE WITH OR IMMEDIATELY FOLLOWING Wolf MEAL. 05/21/23   Ricardo Bearded, PA  pravastatin  (PRAVACHOL ) 20 MG tablet Take 1 tablet (20 mg total) by mouth daily. 05/21/23   Ricardo Bearded, PA  tirzepatide [facility]) 2.5 MG/0.5ML Pen Inject 2.5 mg into the skin once Wolf week. 08/30/23   Ricardo Pintos, MD  traZODone  (DESYREL ) 50 MG tablet TAKE 0.5-1 TABLETS BY MOUTH AT BEDTIME AS NEEDED FOR SLEEP. 03/04/22   Ricardo Pagan, PA-C    Family History Family History  Problem Relation Age of Onset   Diabetes Mother    Esophageal cancer Father    Lung cancer Father    Diabetes Sister    Colon polyps Paternal Aunt    Colon cancer Neg Hx    Rectal cancer Neg Hx    Stomach cancer Neg Hx      Social History Social History   Tobacco Use   Smoking status: Former    Current packs/day: 0.00    Types: Cigarettes    Quit date: 2007    Years since quitting: 18.3    Passive exposure: Current   Smokeless tobacco: Never  Vaping Use   Vaping status: Never Used  Substance Use Topics   Alcohol use: Yes    Alcohol/week: 2.0 standard drinks of alcohol    Types: 2 Standard drinks or equivalent per week    Comment: "once per week"   Drug use: Never     Allergies   Keflet [cephalexin], Penicillins, and Sulfa antibiotics   Review of Systems Review of Systems   Physical Exam Triage Vital Signs ED Triage Vitals  Encounter Vitals Group     BP 09/15/23 0824 131/86     Systolic BP Percentile --      Diastolic BP Percentile --      Pulse Rate 09/15/23 0824 72     Resp 09/15/23 0824 20     Temp 09/15/23 0824 97.9 F (36.6 C)     Temp Source 09/15/23 0824 Oral     SpO2 09/15/23 0824 95 %     Weight --      Height --      Head Circumference --      Peak Flow --      Pain Score 09/15/23 0825 0     Pain Loc --      Pain Education --      Exclude from Growth Chart --    No data found.  Updated Vital Signs BP 131/86 (BP Location: Right Arm)   Pulse 72   Temp 97.9 F (36.6 C) (Oral)   Resp 20   SpO2 95%   Visual Acuity Right Eye Distance:   Left Eye Distance:   Bilateral Distance:    Right Eye Near:   Left Eye Near:    Bilateral Near:     Physical Exam   UC Treatments / Results  Labs (all labs ordered are listed, but only abnormal results are displayed) Labs Reviewed - No data to display  EKG   Radiology No results found.  Procedures Procedures (including critical care time)  Medications Ordered in UC Medications  rabies vaccine  (RABAVERT ) injection 1 mL (1 mL Intramuscular Given 09/15/23 0826)    Initial Impression / Assessment and Plan / UC Course  I have reviewed the triage vital signs and the nursing notes.  Pertinent labs &  imaging  results that were available during my care of the patient were reviewed by me and considered in my medical decision making (see chart for details).  Plan of Care: Rabies Vaccine  today.  Follow-up as needed. Final Clinical Impressions(s) / UC Diagnoses   Final diagnoses:  Dog bite of abdomen   Discharge Instructions   None    ED Prescriptions   None    PDMP not reviewed this encounter.   Guss Legacy, FNP 09/15/23 [phone]

## 2023-09-15 NOTE — ED Triage Notes (Signed)
Here for Rabies Vaccine

## 2023-11-30 ENCOUNTER — Encounter: Payer: Self-pay | Admitting: Family Medicine

## 2023-11-30 ENCOUNTER — Ambulatory Visit: Admitting: Family Medicine

## 2023-11-30 VITALS — BP 133/74 | HR 80 | Ht 73.0 in | Wt 333.4 lb

## 2023-11-30 DIAGNOSIS — E1169 Type 2 diabetes mellitus with other specified complication: Secondary | ICD-10-CM | POA: Diagnosis not present

## 2023-11-30 DIAGNOSIS — E1159 Type 2 diabetes mellitus with other circulatory complications: Secondary | ICD-10-CM

## 2023-11-30 DIAGNOSIS — I152 Hypertension secondary to endocrine disorders: Secondary | ICD-10-CM | POA: Diagnosis not present

## 2023-11-30 DIAGNOSIS — E782 Mixed hyperlipidemia: Secondary | ICD-10-CM

## 2023-11-30 DIAGNOSIS — Z0000 Encounter for general adult medical examination without abnormal findings: Secondary | ICD-10-CM | POA: Diagnosis not present

## 2023-11-30 LAB — POCT GLYCOSYLATED HEMOGLOBIN (HGB A1C): HbA1c POC (<> result, manual entry): 8.1 % (ref 4.0–5.6)

## 2023-11-30 MED ORDER — TIRZEPATIDE 2.5 MG/0.5ML ~~LOC~~ SOAJ
2.5000 mg | SUBCUTANEOUS | 1 refills | Status: AC
Start: 2023-11-30 — End: ?

## 2023-11-30 NOTE — Patient Instructions (Addendum)
 It was nice to see you today,  We addressed the following topics today: - The pharmacy is closed until 2 so I am just going to resend the Mounjaro .  Call them to see if they have received it later today. - What ever papers you need me to sign for your physical bring to the office and I can sign those. -  Have a great day,  Rolan Slain, MD

## 2023-11-30 NOTE — Assessment & Plan Note (Signed)
-   Blood pressure is well-controlled on amlodipine  and lisinopril  since discontinuing hydrochlorothiazide . Home BP readings are around 128-130. - Continue current medication regimen.

## 2023-11-30 NOTE — Progress Notes (Unsigned)
 Established Patient Office Visit  Subjective   Patient ID: Ricardo Wolf, male    DOB: [birth year]  Age: 58 y.o. MRN: [id number]  Chief Complaint  Patient presents with   Medical Management of Chronic Issues    HPI  Subjective - Follow-up for hypertension and type 2 diabetes. - Healed dog bite, no issues. Completed rabies vaccine  series. - Since stopping hydrochlorothiazide  and starting amlodipine /lisinopril , reports urinating less. - Has not started Mounjaro . Pharmacy will be contacted to follow up on the prescription. - Reports HbA1c has improved. - Reports checking blood pressure at home, readings are around 128-130 systolic. - Denies any other new concerns. - Annual physical examination requested for work [employer], to be completed today.  Medications Amlodipine , lisinopril , metformin , and Jardiance . Mounjaro  to be started. Previously on Gentaduo, which has been stopped. Was on Trulicity  in the past.  PMH, PSH, FH, Social Hx PMHx: Hypertension, type 2 diabetes, history of dog bite. FHx: Mother has hypertension and cancer. Social Hx: Quit smoking in 2002. Drinks beer approximately three days a month, typically when camping.  ROS Cardiovascular: Denies chest pain. Endocrine: Denies polyuria. Constitutional: Denies fever, chills. General: Denies any other new issues.   The 10-year ASCVD risk score (Arnett DK, et al., 2019) is: 15.9%  Health Maintenance Due  Topic Date Due   Pneumococcal Vaccine: 19-49 Years (1 of 2 - PCV) Never done   Pneumococcal Vaccine: 50+ Years (1 of 2 - PCV) Never done   Hepatitis B Vaccines (1 of 3 - 19+ 3-dose series) Never done   OPHTHALMOLOGY EXAM  02/17/2022   COVID-19 Vaccine (3 - 2024-25 season) 12/27/2022   INFLUENZA VACCINE  11/26/2023      Objective:     BP 133/74   Pulse 80   Ht 6' 1 (1.854 m)   Wt (!) 333 lb 6.4 oz (151.2 kg)   SpO2 95%   BMI 43.99 kg/m  {Vitals History (Optional):23777}  Physical  Exam General: Alert, oriented HEENT: PERRLA, EOMI CV: Regular rate rhythm no murmurs Pulmonary: Lungs good bilaterally GI: Soft, normal bowel sounds MSK: Strength equal bilaterally Skin: Tanned skin pedunculated mole on right cheek and left lateral eyelid Psych: Pleasant affect   Results for orders placed or performed in visit on 11/30/23  POCT glycosylated hemoglobin (Hb A1C)  Result Value Ref Range   Hemoglobin A1C     HbA1c POC (<> result, manual entry) 8.1 4.0 - 5.6 %   HbA1c, POC (prediabetic range)     HbA1c, POC (controlled diabetic range)          Assessment & Plan:   Physical exam, annual  Type 2 diabetes mellitus with other specified complication, without long-term current use of insulin (HCC) Assessment & Plan: - Diabetes management follow-up. HbA1c has improved from 9.8 in April to 8.1. This improvement occurred after stopping Gentaduo and continuing metformin  and Jardiance , prior to starting the prescribed Mounjaro . Has an upcoming diabetic eye screening on 12/09/2023. - Resend Mounjaro  prescription to the pharmacy. Start with the initial dose for one month, then increase to a higher dose. - Follow up in 3 months. Contact office sooner if there are issues obtaining Mounjaro . - No new labs needed today.  Orders: -     POCT glycosylated hemoglobin (Hb A1C)  Hypertension associated with diabetes (HCC) Assessment & Plan: - Blood pressure is well-controlled on amlodipine  and lisinopril  since discontinuing hydrochlorothiazide . Home BP readings are around 128-130. - Continue current medication regimen.  Orders: -  POCT glycosylated hemoglobin (Hb A1C)  Mixed diabetic hyperlipidemia associated with type 2 diabetes mellitus (HCC) -     POCT glycosylated hemoglobin (Hb A1C)  Other orders -     Tirzepatide ; Inject 2.5 mg into the skin once a week.  Dispense: 2 mL; Refill: 1     Return in about 3 months (around 03/01/2024) for DM.    Toribio MARLA Slain, MD

## 2023-11-30 NOTE — Assessment & Plan Note (Signed)
-   Diabetes management follow-up. HbA1c has improved from 9.8 in April to 8.1. This improvement occurred after stopping Gentaduo and continuing metformin  and Jardiance , prior to starting the prescribed Mounjaro . Has an upcoming diabetic eye screening on 12/09/2023. - Resend Mounjaro  prescription to the pharmacy. Start with the initial dose for one month, then increase to a higher dose. - Follow up in 3 months. Contact office sooner if there are issues obtaining Mounjaro . - No new labs needed today.

## 2024-01-04 ENCOUNTER — Other Ambulatory Visit: Payer: Self-pay | Admitting: Family Medicine

## 2024-01-04 DIAGNOSIS — E1159 Type 2 diabetes mellitus with other circulatory complications: Secondary | ICD-10-CM

## 2024-02-08 ENCOUNTER — Other Ambulatory Visit: Payer: Self-pay | Admitting: Family Medicine

## 2024-02-08 DIAGNOSIS — E1169 Type 2 diabetes mellitus with other specified complication: Secondary | ICD-10-CM

## 2024-03-08 ENCOUNTER — Ambulatory Visit: Admitting: Family Medicine

## 2024-04-07 ENCOUNTER — Other Ambulatory Visit: Payer: Self-pay | Admitting: Family Medicine

## 2024-04-12 ENCOUNTER — Other Ambulatory Visit: Payer: Self-pay | Admitting: Family Medicine

## 2024-04-12 ENCOUNTER — Ambulatory Visit: Admitting: Family Medicine

## 2024-04-12 ENCOUNTER — Encounter: Payer: Self-pay | Admitting: Family Medicine

## 2024-04-12 VITALS — BP 113/71 | HR 82 | Ht 73.0 in | Wt 339.0 lb

## 2024-04-12 DIAGNOSIS — M25511 Pain in right shoulder: Secondary | ICD-10-CM | POA: Diagnosis not present

## 2024-04-12 DIAGNOSIS — I152 Hypertension secondary to endocrine disorders: Secondary | ICD-10-CM

## 2024-04-12 DIAGNOSIS — E782 Mixed hyperlipidemia: Secondary | ICD-10-CM

## 2024-04-12 DIAGNOSIS — E1159 Type 2 diabetes mellitus with other circulatory complications: Secondary | ICD-10-CM

## 2024-04-12 DIAGNOSIS — E1169 Type 2 diabetes mellitus with other specified complication: Secondary | ICD-10-CM | POA: Diagnosis not present

## 2024-04-12 LAB — POCT GLYCOSYLATED HEMOGLOBIN (HGB A1C): HbA1c POC (<> result, manual entry): 6.4 % (ref 4.0–5.6)

## 2024-04-12 MED ORDER — TIRZEPATIDE 5 MG/0.5ML ~~LOC~~ SOAJ: 5.0000 mg | SUBCUTANEOUS | 2 refills | Status: AC

## 2024-04-12 MED ORDER — OSELTAMIVIR PHOSPHATE 75 MG PO CAPS: 75.0000 mg | ORAL_CAPSULE | Freq: Two times a day (BID) | ORAL | 0 refills | Status: AC

## 2024-04-12 NOTE — Assessment & Plan Note (Signed)
 The presentation is concerning for a rotator cuff tear, given the mechanism of injury and clinical findings of pain with active and resisted external rotation, positive hawkins and difficulty with overhead motion. A fracture is also a possibility. - Plan to obtain an X-ray of the right shoulder to rule out fracture. - Plan to refer to Orthopedics for further evaluation, likely for an MRI, pending X-ray results. - Advised on pain management options including topical agents (Voltaren gel, Icy Hot, lidocaine patches), acetaminophen  1000 mg up to three times a day, and NSAIDs like ibuprofen, with caution regarding daily long-term use of NSAIDs. The patient can continue using Dual Action Advil as needed.

## 2024-04-12 NOTE — Patient Instructions (Signed)
 It was nice to see you today,  We addressed the following topics today: - Please go to [facility] at [facility] for the right shoulder X-ray. You can walk in without an appointment as long as the order is in the system. - Please let us  know if you have not heard from the orthopedic office within two weeks of the referral being sent. - Please finish your current supply of Mounjaro  2.5 mg before starting the new 5 mg dose.  Have a great day,  Rolan Slain, MD

## 2024-04-12 NOTE — Assessment & Plan Note (Signed)
 HgbA1c has improved from 8.1% to 6.4% on Mounjaro  2.5 mg. The goal is to continue to improve glycemic control and assist with weight management. - Increase Mounjaro  to 5 mg injection weekly. The patient will finish the current supply of 2.5 mg and then start the new dose. Advised to continue the current dose as long as weight loss is occurring, and to only increase if weight loss plateaus or stops.

## 2024-04-12 NOTE — Progress Notes (Unsigned)
 Established Patient Office Visit  Subjective   Patient ID: Ricardo Wolf, male    DOB: 1965-09-08  Age: 58 y.o. MRN: [id number]  Chief Complaint  Patient presents with   Medical Management of Chronic Issues    HPI Subjective - Follow-up for blood sugar management. Reports recent HgbA1c was 6.4, improved from 8.1 previously. - Started Mounjaro  2.5 mg approximately 2.5-3 months ago. Reports no issues with nausea or other side effects. - Reports right shoulder pain. 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 Describes constant pain and difficulty with lifting the arm, particularly overhead. Also reports difficulty with activities of daily living, such as bringing food to the mouth. Takes Dual Action Advil (ibuprofen/acetaminophen ) occasionally for pain, typically in the morning if it is hurting. - Denies any other concerns.  Medications Mounjaro  2.5 mg injection, started approximately 2.5-3 months ago. Dual Action Advil (ibuprofen/acetaminophen ) taken occasionally for shoulder pain. Meloxicam , prescribed approximately two years ago, is not currently being taken.  PMH/PSH/FH/Social Hx PMHx: Diabetes, hypertension. PSHx: Colonoscopy, last one was approximately 3 years ago. Social Hx: Works with clinical research associate.  ROS Constitutional: Denies fever, chills. Eyes: Reports recent eye exam at [facility]. Musculoskeletal: Reports right shoulder pain with limited range of motion. Denies other joint pain. Neurological: Denies headache, dizziness.    The 10-year ASCVD risk score (Arnett DK, et al., 2019) is: 12.1%  Health Maintenance Due  Topic Date Due   Pneumococcal Vaccine: 50+ Years (1 of 2 - PCV) Never done   Hepatitis B Vaccines 19-59 Average Risk (1 of 3 - 19+ 3-dose series) Never done   OPHTHALMOLOGY EXAM  02/17/2022   Influenza Vaccine  11/26/2023   COVID-19 Vaccine (3 - 2025-26 season) 12/27/2023   Colonoscopy   07/25/2024      Objective:     BP 113/71   Pulse 82   Ht 6' 1 (1.854 m)   Wt (!) 339 lb (153.8 kg)   SpO2 96%   BMI 44.73 kg/m    Physical Exam General: Appears well, in no acute distress. CV: Regular rate and rhythm, no murmurs. PULM: Lungs clear to auscultation bilaterally. MSK: Right shoulder examination reveals tenderness to palpation over the anterior and lateral aspects. Pain with active forward flexion and abduction. Pain with resisted external rotation. No pain with resisted internal rotation. Strength is 5/5 in all muscle groups tested. No evidence of dislocation.   Results for orders placed or performed in visit on 04/12/24  POCT glycosylated hemoglobin (Hb A1C)  Result Value Ref Range   Hemoglobin A1C     HbA1c POC (<> result, manual entry) 6.4 4.0 - 5.6 %   HbA1c, POC (prediabetic range)     HbA1c, POC (controlled diabetic range)          Assessment & Plan:   Type 2 diabetes mellitus with other specified complication, without long-term current use of insulin (HCC) Assessment & Plan: HgbA1c has improved from 8.1% to 6.4% on Mounjaro  2.5 mg. The goal is to continue to improve glycemic control and assist with weight management. - Increase Mounjaro  to 5 mg injection weekly. The patient will finish the current supply of 2.5 mg and then start the new dose. Advised to continue the current dose as long as weight loss is occurring, and to only increase if weight loss plateaus or stops.  Orders: -     POCT glycosylated hemoglobin (Hb A1C)  Hypertension associated with diabetes (HCC) -  POCT glycosylated hemoglobin (Hb A1C)  Mixed diabetic hyperlipidemia associated with type 2 diabetes mellitus (HCC) -     POCT glycosylated hemoglobin (Hb A1C)  Acute pain of right shoulder Assessment & Plan: The presentation is concerning for a rotator cuff tear, given the mechanism of injury and clinical findings of pain with active and resisted external rotation, positive  hawkins and difficulty with overhead motion. A fracture is also a possibility. - Plan to obtain an X-ray of the right shoulder to rule out fracture. - Plan to refer to Orthopedics for further evaluation, likely for an MRI, pending X-ray results. - Advised on pain management options including topical agents (Voltaren gel, Icy Hot, lidocaine patches), acetaminophen  1000 mg up to three times a day, and NSAIDs like ibuprofen, with caution regarding daily long-term use of NSAIDs. The patient can continue using Dual Action Advil as needed.  Orders: -     DG Shoulder Right; Future  Other orders -     Tirzepatide ; Inject 5 mg into the skin once a week.  Dispense: 6 mL; Refill: 2     Return in about 4 months (around 08/11/2024) for DM.    Toribio MARLA Slain, MD

## 2024-04-13 LAB — HM DIABETES EYE EXAM

## 2024-04-14 LAB — OPHTHALMOLOGY REPORT-SCANNED

## 2024-04-28 ENCOUNTER — Ambulatory Visit
Admission: RE | Admit: 2024-04-28 | Discharge: 2024-04-28 | Disposition: A | Discharge status: Home / Self Care | Source: Ambulatory Visit | Attending: Family Medicine | Admitting: Family Medicine

## 2024-04-28 ENCOUNTER — Telehealth: Payer: Self-pay

## 2024-04-28 DIAGNOSIS — M25511 Pain in right shoulder: Secondary | ICD-10-CM

## 2024-04-28 NOTE — Telephone Encounter (Signed)
 Copied from CRM #[id number]. Topic: Clinical - Lab/Test Results >> Apr 28, 2024 10:19 AM Tinnie BROCKS wrote: Reason for CRM: FYI- Pt calling to let us  know that his shoulder x-ray was completed today and he wants to know next steps. I let him know this will likely be determined by the results. He wants a call once results are available [phone]

## 2024-05-08 ENCOUNTER — Other Ambulatory Visit: Payer: Self-pay | Admitting: Family Medicine

## 2024-05-08 ENCOUNTER — Ambulatory Visit: Payer: Self-pay | Admitting: Family Medicine

## 2024-05-08 DIAGNOSIS — M25511 Pain in right shoulder: Secondary | ICD-10-CM

## 2024-05-08 NOTE — Progress Notes (Signed)
 Called pt he is advised of his lab results he stated that he continued to have pain he is agreeable to the referral

## 2024-05-11 ENCOUNTER — Ambulatory Visit (INDEPENDENT_AMBULATORY_CARE_PROVIDER_SITE_OTHER): Admitting: Orthopaedic Surgery

## 2024-05-11 DIAGNOSIS — M25511 Pain in right shoulder: Secondary | ICD-10-CM | POA: Diagnosis not present

## 2024-05-11 NOTE — Progress Notes (Signed)
 "   Chief Complaint: Right shoulder pain     History of Present Illness:    Ricardo Wolf is a 59 y.o. male presents today with ongoing right shoulder pain since September 2025.  He does work for [employer].  He is moving a boating machine and since that time felt a sharp pain with inability to lift his arm.  He does have a history of diabetes last A1c was 6.0.  No significant medical history otherwise.  This is his dominant arm.  He is having difficulty with ADLs at this time.    PMH/PSH/Family History/Social History/Meds/Allergies:    Past Medical History:  Diagnosis Date   Allergy    pollen   Arthritis    back,shoulders,knees   Diabetes (HCC)    Elevated liver enzymes 12/05/2018   Family history of secondary lung cancer- father late 66s, heavy smoker 09/10/2018   GERD (gastroesophageal reflux disease)    High cholesterol    History of smoking - 75-pack-year history; quit 05-19-2005 09/10/2018   Tobacco Use - Former Smoker  Started smoking in 05-19-77 at age 88, quit in 19-May-2005.  30 year history of smoking at up to 3 ppd.   Averaged 2.5 ppd.  ~75 pack-year history.   Father was a heavy smoker; had lung cancer. He retired at age 15, found out he had cancer at age 80; died at age 48 (in 05/19/2005).  Patient quit smoking the day his father died.     Hypertension    Tooth abscess 11/17/2018   Past Surgical History:  Procedure Laterality Date   BACK SURGERY  19-May-2001   BACK SURGERY  May 19, 2009   BACK SURGERY     May 19, 2017   Social History   Socioeconomic History   Marital status: Married    Spouse name: Not on file   Number of children: Not on file   Years of education: Not on file   Highest education level: Not on file  Occupational History   Not on file  Tobacco Use   Smoking status: Former    Current packs/day: 0.00    Average packs/day: 1.0 packs/day    Types: Cigarettes    Quit date: 05-19-05    Years since quitting: 19.0    Passive exposure: Current   Smokeless tobacco: Never  Vaping Use    Vaping status: Never Used  Substance and Sexual Activity   Alcohol use: Yes    Alcohol/week: 2.0 standard drinks of alcohol    Types: 2 Standard drinks or equivalent per week    Comment: once per week   Drug use: Never   Sexual activity: Yes    Partners: Female    Birth control/protection: None  Other Topics Concern   Not on file  Social History Narrative   Not on file   Social Drivers of Health   Tobacco Use: Medium Risk (04/12/2024)   Patient History    Smoking Tobacco Use: Former    Smokeless Tobacco Use: Never    Passive Exposure: Current  Programmer, Applications: Not on file  Food Insecurity: Not on file  Transportation Needs: Not on file  Physical Activity: Not on file  Stress: Not on file  Social Connections: Not on file  Depression (PHQ2-9): Low Risk (11/30/2023)   Depression (PHQ2-9)    PHQ-2 Score: 0  Alcohol Screen: Not on file  Housing: Not on file  Utilities: Not on file  Health Literacy: Not on file   Family History  Problem Relation Age  of Onset   Diabetes Mother    Esophageal cancer Father    Lung cancer Father    Diabetes Sister    Colon polyps Paternal Aunt    Colon cancer Neg Hx    Rectal cancer Neg Hx    Stomach cancer Neg Hx    Allergies[1] Current Outpatient Medications  Medication Sig Dispense Refill   amLODipine  (NORVASC ) 5 MG tablet Take 1 tablet (5 mg total) by mouth daily. 90 tablet 3   Ez Smart Blood Glucose Lancets MISC Use to check fasting blood sugar 100 each 12   gabapentin  (NEURONTIN ) 300 MG capsule TAKE 1 CAPSULE BY MOUTH TWICE A DAY 60 capsule 5   glucose blood test strip Use as instructed 100 each 12   JARDIANCE  10 MG TABS tablet TAKE 1 TABLET BY MOUTH DAILY BEFORE BREAKFAST. 90 tablet 1   lisinopril  (ZESTRIL ) 20 MG tablet Take 1 tablet (20 mg total) by mouth daily. 90 tablet 3   metFORMIN  (GLUCOPHAGE -XR) 500 MG 24 hr tablet TAKE 2 TABLETS BY MOUTH EVERY DAY WITH BREAKFAST 60 tablet 5   metoprolol  succinate (TOPROL -XL)  100 MG 24 hr tablet TAKE 1 TABLET BY MOUTH DAILY. TAKE WITH OR IMMEDIATELY FOLLOWING A MEAL. 90 tablet 1   pravastatin  (PRAVACHOL ) 20 MG tablet Take 1 tablet (20 mg total) by mouth daily. 90 tablet 1   tirzepatide  (MOUNJARO ) 5 MG/0.5ML Pen Inject 5 mg into the skin once a week. 6 mL 2   No current facility-administered medications for this visit.   No results found.  Review of Systems:   A ROS was performed including pertinent positives and negatives as documented in the HPI.  Physical Exam :   Constitutional: NAD and appears stated age Neurological: Alert and oriented Psych: Appropriate affect and cooperative There were no vitals taken for this visit.   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  External rotation with side is to neutral internal rotation is to back pocket.  He has 4 out of 5 strength with forward elevation   Imaging:   Xray (3 views right): Normal    I personally reviewed and interpreted the radiographs.   Assessment and Plan:   59 y.o. male presents today with evidence of an acute rotator cuff injury after moving a boating machine.  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  Elspeth Parker, MD Attending Physician, Orthopedic Surgery  This document was dictated using Dragon voice recognition software. A reasonable attempt at proof reading has been made to minimize errors.    [1]  Allergies Allergen Reactions   Keflet [Cephalexin] Rash   Penicillins Rash   Sulfa Antibiotics Rash   "

## 2024-05-19 ENCOUNTER — Encounter (HOSPITAL_BASED_OUTPATIENT_CLINIC_OR_DEPARTMENT_OTHER): Payer: Self-pay | Admitting: Orthopaedic Surgery

## 2024-05-19 ENCOUNTER — Ambulatory Visit
Admission: RE | Admit: 2024-05-19 | Discharge: 2024-05-19 | Disposition: A | Source: Ambulatory Visit | Attending: Orthopaedic Surgery

## 2024-05-19 ENCOUNTER — Telehealth: Payer: Self-pay | Admitting: Orthopaedic Surgery

## 2024-05-19 DIAGNOSIS — M25511 Pain in right shoulder: Secondary | ICD-10-CM

## 2024-05-19 NOTE — Telephone Encounter (Signed)
 Patient called. He needs a note for work stating his restrictions.

## 2024-05-19 NOTE — Telephone Encounter (Signed)
 Note emailed to patient at [email or web address]

## 2024-05-23 ENCOUNTER — Telehealth: Payer: Self-pay | Admitting: Orthopaedic Surgery

## 2024-05-23 NOTE — Telephone Encounter (Signed)
 Note completed and emailed. Called and advised pt

## 2024-05-23 NOTE — Telephone Encounter (Signed)
 I called and advised pt it is under Mychart as well

## 2024-05-23 NOTE — Telephone Encounter (Signed)
 Patient called to let the office know that he did not receive the email with his letter. Can this be resent?

## 2024-05-23 NOTE — Telephone Encounter (Signed)
 Pt called asking for his work restriction note to change to back to work with no restrictions. Please send letter to pt email at [email or web address]. Pt number is [phone].

## 2024-06-02 ENCOUNTER — Other Ambulatory Visit: Payer: Self-pay | Admitting: Family Medicine

## 2024-06-02 DIAGNOSIS — E1169 Type 2 diabetes mellitus with other specified complication: Secondary | ICD-10-CM

## 2024-06-14 ENCOUNTER — Ambulatory Visit (HOSPITAL_BASED_OUTPATIENT_CLINIC_OR_DEPARTMENT_OTHER): Admitting: Orthopaedic Surgery

## 2024-08-04 ENCOUNTER — Other Ambulatory Visit

## 2024-08-11 ENCOUNTER — Ambulatory Visit: Admitting: Family Medicine
